# Patient Record
Sex: Female | Born: 1985
Health system: Southern US, Community
[De-identification: ages and names within clinical notes are randomized; demographics above are authoritative.]

## PROBLEM LIST (undated history)

## (undated) DIAGNOSIS — R87629 Unspecified abnormal cytological findings in specimens from vagina: Secondary | ICD-10-CM

## (undated) DIAGNOSIS — K2970 Gastritis, unspecified, without bleeding: Secondary | ICD-10-CM

## (undated) DIAGNOSIS — F319 Bipolar disorder, unspecified: Secondary | ICD-10-CM

## (undated) DIAGNOSIS — E079 Disorder of thyroid, unspecified: Secondary | ICD-10-CM

## (undated) DIAGNOSIS — K589 Irritable bowel syndrome without diarrhea: Secondary | ICD-10-CM

## (undated) DIAGNOSIS — N390 Urinary tract infection, site not specified: Secondary | ICD-10-CM

## (undated) DIAGNOSIS — K219 Gastro-esophageal reflux disease without esophagitis: Secondary | ICD-10-CM

## (undated) DIAGNOSIS — N159 Renal tubulo-interstitial disease, unspecified: Secondary | ICD-10-CM

## (undated) DIAGNOSIS — Z86018 Personal history of other benign neoplasm: Secondary | ICD-10-CM

## (undated) HISTORY — PX: TUBAL LIGATION: SHX77

## (undated) HISTORY — DX: Unspecified abnormal cytological findings in specimens from vagina: R87.629

## (undated) HISTORY — DX: Disorder of thyroid, unspecified: E07.9

## (undated) HISTORY — DX: Personal history of other benign neoplasm: Z86.018

## (undated) HISTORY — PX: OTHER SURGICAL HISTORY: SHX169

## (undated) HISTORY — PX: LASER ABLATION OF THE CERVIX: SHX1949

## (undated) HISTORY — DX: Gastro-esophageal reflux disease without esophagitis: K21.9

## (undated) HISTORY — DX: Renal tubulo-interstitial disease, unspecified: N15.9

## (undated) HISTORY — DX: Urinary tract infection, site not specified: N39.0

## (undated) HISTORY — DX: Gastritis, unspecified, without bleeding: K29.70

## (undated) HISTORY — DX: Irritable bowel syndrome, unspecified: K58.9

## (undated) HISTORY — PX: DILATION AND CURETTAGE OF UTERUS: SHX78

## (undated) HISTORY — PX: INDUCED ABORTION: SHX677

---
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
## 2003-01-18 ENCOUNTER — Ambulatory Visit (HOSPITAL_COMMUNITY): Admission: AD | Admit: 2003-01-18 | Discharge: 2003-01-18 | Payer: Self-pay | Admitting: Obstetrics and Gynecology

## 2003-02-06 ENCOUNTER — Ambulatory Visit (HOSPITAL_COMMUNITY): Admission: AD | Admit: 2003-02-06 | Discharge: 2003-02-06 | Payer: Self-pay | Admitting: Obstetrics and Gynecology

## 2003-02-12 ENCOUNTER — Ambulatory Visit (HOSPITAL_COMMUNITY): Admission: RE | Admit: 2003-02-12 | Discharge: 2003-02-12 | Payer: Self-pay | Admitting: Obstetrics and Gynecology

## 2003-02-17 ENCOUNTER — Ambulatory Visit (HOSPITAL_COMMUNITY): Admission: RE | Admit: 2003-02-17 | Discharge: 2003-02-17 | Payer: Self-pay | Admitting: Obstetrics and Gynecology

## 2003-02-23 ENCOUNTER — Ambulatory Visit (HOSPITAL_COMMUNITY): Admission: RE | Admit: 2003-02-23 | Discharge: 2003-02-23 | Payer: Self-pay | Admitting: Obstetrics and Gynecology

## 2003-03-04 ENCOUNTER — Ambulatory Visit (HOSPITAL_COMMUNITY): Admission: AD | Admit: 2003-03-04 | Discharge: 2003-03-05 | Payer: Self-pay | Admitting: Obstetrics and Gynecology

## 2003-03-06 ENCOUNTER — Ambulatory Visit (HOSPITAL_COMMUNITY): Admission: AD | Admit: 2003-03-06 | Discharge: 2003-03-06 | Payer: Self-pay | Admitting: Obstetrics and Gynecology

## 2003-03-12 ENCOUNTER — Ambulatory Visit (HOSPITAL_COMMUNITY): Admission: RE | Admit: 2003-03-12 | Discharge: 2003-03-12 | Payer: Self-pay | Admitting: Obstetrics and Gynecology

## 2003-03-17 ENCOUNTER — Inpatient Hospital Stay (HOSPITAL_COMMUNITY): Admission: AD | Admit: 2003-03-17 | Discharge: 2003-03-21 | Payer: Self-pay | Admitting: Obstetrics and Gynecology

## 2004-01-11 ENCOUNTER — Emergency Department (HOSPITAL_COMMUNITY): Admission: EM | Admit: 2004-01-11 | Discharge: 2004-01-11 | Payer: Self-pay | Admitting: Emergency Medicine

## 2004-02-11 ENCOUNTER — Emergency Department (HOSPITAL_COMMUNITY): Admission: EM | Admit: 2004-02-11 | Discharge: 2004-02-12 | Payer: Self-pay | Admitting: Emergency Medicine

## 2004-02-21 ENCOUNTER — Emergency Department (HOSPITAL_COMMUNITY): Admission: EM | Admit: 2004-02-21 | Discharge: 2004-02-21 | Payer: Self-pay | Admitting: Emergency Medicine

## 2004-06-01 ENCOUNTER — Emergency Department (HOSPITAL_COMMUNITY): Admission: EM | Admit: 2004-06-01 | Discharge: 2004-06-01 | Payer: Self-pay | Admitting: Emergency Medicine

## 2004-06-17 ENCOUNTER — Inpatient Hospital Stay (HOSPITAL_COMMUNITY): Admission: EM | Admit: 2004-06-17 | Discharge: 2004-06-18 | Payer: Self-pay | Admitting: *Deleted

## 2004-07-08 ENCOUNTER — Ambulatory Visit (HOSPITAL_COMMUNITY): Admission: AD | Admit: 2004-07-08 | Discharge: 2004-07-08 | Payer: Self-pay | Admitting: Obstetrics and Gynecology

## 2004-07-30 ENCOUNTER — Ambulatory Visit (HOSPITAL_COMMUNITY): Admission: RE | Admit: 2004-07-30 | Discharge: 2004-07-30 | Payer: Self-pay | Admitting: Obstetrics and Gynecology

## 2004-08-29 ENCOUNTER — Ambulatory Visit (HOSPITAL_COMMUNITY): Admission: AD | Admit: 2004-08-29 | Discharge: 2004-08-29 | Payer: Self-pay | Admitting: Obstetrics and Gynecology

## 2004-09-08 ENCOUNTER — Ambulatory Visit (HOSPITAL_COMMUNITY): Admission: RE | Admit: 2004-09-08 | Discharge: 2004-09-08 | Payer: Self-pay | Admitting: Obstetrics and Gynecology

## 2004-09-29 ENCOUNTER — Ambulatory Visit (HOSPITAL_COMMUNITY): Admission: AD | Admit: 2004-09-29 | Discharge: 2004-09-29 | Payer: Self-pay | Admitting: Obstetrics and Gynecology

## 2004-09-30 ENCOUNTER — Ambulatory Visit (HOSPITAL_COMMUNITY): Admission: AD | Admit: 2004-09-30 | Discharge: 2004-09-30 | Payer: Self-pay | Admitting: Obstetrics and Gynecology

## 2004-10-14 ENCOUNTER — Inpatient Hospital Stay (HOSPITAL_COMMUNITY): Admission: AD | Admit: 2004-10-14 | Discharge: 2004-10-15 | Payer: Self-pay | Admitting: Obstetrics and Gynecology

## 2004-10-20 ENCOUNTER — Observation Stay (HOSPITAL_COMMUNITY): Admission: AD | Admit: 2004-10-20 | Discharge: 2004-10-21 | Payer: Self-pay | Admitting: Obstetrics and Gynecology

## 2004-10-28 ENCOUNTER — Inpatient Hospital Stay (HOSPITAL_COMMUNITY): Admission: RE | Admit: 2004-10-28 | Discharge: 2004-10-31 | Payer: Self-pay | Admitting: Obstetrics and Gynecology

## 2004-10-28 ENCOUNTER — Encounter: Payer: Self-pay | Admitting: Obstetrics and Gynecology

## 2005-03-09 ENCOUNTER — Emergency Department (HOSPITAL_COMMUNITY): Admission: EM | Admit: 2005-03-09 | Discharge: 2005-03-09 | Payer: Self-pay | Admitting: Emergency Medicine

## 2005-07-24 ENCOUNTER — Emergency Department (HOSPITAL_COMMUNITY): Admission: EM | Admit: 2005-07-24 | Discharge: 2005-07-25 | Payer: Self-pay | Admitting: Emergency Medicine

## 2005-07-26 ENCOUNTER — Emergency Department (HOSPITAL_COMMUNITY): Admission: EM | Admit: 2005-07-26 | Discharge: 2005-07-26 | Payer: Self-pay | Admitting: Emergency Medicine

## 2005-09-19 ENCOUNTER — Emergency Department (HOSPITAL_COMMUNITY): Admission: EM | Admit: 2005-09-19 | Discharge: 2005-09-20 | Payer: Self-pay | Admitting: Emergency Medicine

## 2005-12-04 ENCOUNTER — Emergency Department (HOSPITAL_COMMUNITY): Admission: EM | Admit: 2005-12-04 | Discharge: 2005-12-04 | Payer: Self-pay | Admitting: Emergency Medicine

## 2006-01-28 ENCOUNTER — Emergency Department (HOSPITAL_COMMUNITY): Admission: EM | Admit: 2006-01-28 | Discharge: 2006-01-28 | Payer: Self-pay | Admitting: Emergency Medicine

## 2006-02-28 ENCOUNTER — Encounter (INDEPENDENT_AMBULATORY_CARE_PROVIDER_SITE_OTHER): Payer: Self-pay | Admitting: Specialist

## 2006-02-28 ENCOUNTER — Other Ambulatory Visit: Admission: RE | Admit: 2006-02-28 | Discharge: 2006-02-28 | Payer: Self-pay | Admitting: Unknown Physician Specialty

## 2006-09-20 ENCOUNTER — Emergency Department (HOSPITAL_COMMUNITY): Admission: EM | Admit: 2006-09-20 | Discharge: 2006-09-20 | Payer: Self-pay | Admitting: Emergency Medicine

## 2006-09-23 ENCOUNTER — Emergency Department (HOSPITAL_COMMUNITY): Admission: EM | Admit: 2006-09-23 | Discharge: 2006-09-23 | Payer: Self-pay | Admitting: Emergency Medicine

## 2006-09-26 ENCOUNTER — Emergency Department (HOSPITAL_COMMUNITY): Admission: EM | Admit: 2006-09-26 | Discharge: 2006-09-27 | Payer: Self-pay | Admitting: Emergency Medicine

## 2007-03-26 ENCOUNTER — Emergency Department (HOSPITAL_COMMUNITY): Admission: EM | Admit: 2007-03-26 | Discharge: 2007-03-26 | Payer: Self-pay | Admitting: Emergency Medicine

## 2008-02-22 ENCOUNTER — Emergency Department (HOSPITAL_COMMUNITY): Admission: EM | Admit: 2008-02-22 | Discharge: 2008-02-22 | Payer: Self-pay | Admitting: Emergency Medicine

## 2008-03-28 ENCOUNTER — Emergency Department (HOSPITAL_COMMUNITY): Admission: EM | Admit: 2008-03-28 | Discharge: 2008-03-28 | Payer: Self-pay | Admitting: Emergency Medicine

## 2008-09-19 ENCOUNTER — Emergency Department (HOSPITAL_COMMUNITY): Admission: EM | Admit: 2008-09-19 | Discharge: 2008-09-19 | Payer: Self-pay | Admitting: Emergency Medicine

## 2008-09-25 ENCOUNTER — Emergency Department (HOSPITAL_COMMUNITY): Admission: EM | Admit: 2008-09-25 | Discharge: 2008-09-25 | Payer: Self-pay | Admitting: Emergency Medicine

## 2008-12-07 ENCOUNTER — Emergency Department (HOSPITAL_COMMUNITY): Admission: EM | Admit: 2008-12-07 | Discharge: 2008-12-07 | Payer: Self-pay | Admitting: Emergency Medicine

## 2009-01-22 ENCOUNTER — Emergency Department (HOSPITAL_COMMUNITY): Admission: EM | Admit: 2009-01-22 | Discharge: 2009-01-22 | Payer: Self-pay | Admitting: Emergency Medicine

## 2009-07-25 ENCOUNTER — Emergency Department (HOSPITAL_COMMUNITY): Admission: EM | Admit: 2009-07-25 | Discharge: 2009-07-25 | Payer: Self-pay | Admitting: Emergency Medicine

## 2009-08-09 ENCOUNTER — Emergency Department: Payer: Self-pay | Admitting: Emergency Medicine

## 2010-03-02 ENCOUNTER — Other Ambulatory Visit (HOSPITAL_COMMUNITY)
Admission: RE | Admit: 2010-03-02 | Discharge: 2010-03-02 | Disposition: A | Discharge status: Home / Self Care | Payer: Managed Care, Other (non HMO) | Source: Ambulatory Visit | Attending: Obstetrics and Gynecology | Admitting: Obstetrics and Gynecology

## 2010-03-02 ENCOUNTER — Other Ambulatory Visit: Payer: Self-pay | Admitting: Obstetrics and Gynecology

## 2010-03-02 DIAGNOSIS — Z01419 Encounter for gynecological examination (general) (routine) without abnormal findings: Secondary | ICD-10-CM | POA: Insufficient documentation

## 2010-04-03 LAB — URINALYSIS, ROUTINE W REFLEX MICROSCOPIC
Bilirubin Urine: NEGATIVE
Glucose, UA: NEGATIVE mg/dL
Hgb urine dipstick: NEGATIVE
Ketones, ur: NEGATIVE mg/dL
Nitrite: NEGATIVE
Protein, ur: NEGATIVE mg/dL
Specific Gravity, Urine: <1.005 — ABNORMAL LOW (ref 1.005–1.030)
Urobilinogen, UA: 0.2 mg/dL (ref 0.0–1.0)
pH: 6.5 (ref 5.0–8.0)

## 2010-04-03 LAB — PREGNANCY, URINE: Preg Test, Ur: NEGATIVE

## 2010-04-21 LAB — URINALYSIS, ROUTINE W REFLEX MICROSCOPIC
Bilirubin Urine: NEGATIVE
Glucose, UA: NEGATIVE mg/dL
Hgb urine dipstick: NEGATIVE
Ketones, ur: NEGATIVE mg/dL
Nitrite: NEGATIVE
Protein, ur: NEGATIVE mg/dL
Specific Gravity, Urine: 1.025 (ref 1.005–1.030)
Urobilinogen, UA: 0.2 mg/dL (ref 0.0–1.0)
pH: 6.5 (ref 5.0–8.0)

## 2010-04-21 LAB — WET PREP, GENITAL
Trich, Wet Prep: NONE SEEN
Yeast Wet Prep HPF POC: NONE SEEN

## 2010-04-21 LAB — URINE CULTURE: Colony Count: 55000

## 2010-04-21 LAB — GC/CHLAMYDIA PROBE AMP, GENITAL
Chlamydia, DNA Probe: NEGATIVE
GC Probe Amp, Genital: NEGATIVE

## 2010-04-21 LAB — PREGNANCY, URINE: Preg Test, Ur: NEGATIVE

## 2010-04-23 LAB — URINE MICROSCOPIC-ADD ON

## 2010-04-23 LAB — URINALYSIS, ROUTINE W REFLEX MICROSCOPIC
Bilirubin Urine: NEGATIVE
Glucose, UA: NEGATIVE mg/dL
Glucose, UA: NEGATIVE mg/dL
Ketones, ur: NEGATIVE mg/dL
Ketones, ur: NEGATIVE mg/dL
Leukocytes, UA: NEGATIVE
Nitrite: NEGATIVE
Nitrite: NEGATIVE
Protein, ur: 100 mg/dL — AB
Protein, ur: NEGATIVE mg/dL
Specific Gravity, Urine: 1.004 — ABNORMAL LOW (ref 1.005–1.030)
Specific Gravity, Urine: >1.03 — ABNORMAL HIGH (ref 1.005–1.030)
Urobilinogen, UA: 0.2 mg/dL (ref 0.0–1.0)
Urobilinogen, UA: 2 mg/dL — ABNORMAL HIGH (ref 0.0–1.0)
pH: 6 (ref 5.0–8.0)
pH: 7 (ref 5.0–8.0)

## 2010-04-23 LAB — URINE CULTURE: Colony Count: >=100000

## 2010-04-23 LAB — POCT PREGNANCY, URINE: Preg Test, Ur: NEGATIVE

## 2010-04-23 LAB — PREGNANCY, URINE: Preg Test, Ur: NEGATIVE

## 2010-04-29 LAB — GC/CHLAMYDIA PROBE AMP, GENITAL
Chlamydia, DNA Probe: NEGATIVE
GC Probe Amp, Genital: NEGATIVE

## 2010-04-29 LAB — URINALYSIS, ROUTINE W REFLEX MICROSCOPIC
Bilirubin Urine: NEGATIVE
Nitrite: NEGATIVE
Protein, ur: NEGATIVE mg/dL

## 2010-04-29 LAB — URINE CULTURE

## 2010-04-29 LAB — WET PREP, GENITAL
Trich, Wet Prep: NONE SEEN
Yeast Wet Prep HPF POC: NONE SEEN

## 2010-05-06 ENCOUNTER — Other Ambulatory Visit: Payer: Self-pay | Admitting: Obstetrics & Gynecology

## 2010-05-06 ENCOUNTER — Encounter (HOSPITAL_COMMUNITY): Payer: Managed Care, Other (non HMO)

## 2010-05-06 ENCOUNTER — Other Ambulatory Visit: Payer: Self-pay | Admitting: Infectious Diseases

## 2010-05-06 LAB — URINALYSIS, ROUTINE W REFLEX MICROSCOPIC
Bilirubin Urine: NEGATIVE
Hgb urine dipstick: NEGATIVE
Ketones, ur: NEGATIVE mg/dL
Nitrite: NEGATIVE
pH: 6.5 (ref 5.0–8.0)

## 2010-05-06 LAB — COMPREHENSIVE METABOLIC PANEL
BUN: 6 mg/dL (ref 6–23)
CO2: 22 mEq/L (ref 19–32)
Calcium: 8.7 mg/dL (ref 8.4–10.5)
Chloride: 105 mEq/L (ref 96–112)
Creatinine, Ser: 0.86 mg/dL (ref 0.4–1.2)
GFR calc Af Amer: >60 mL/min (ref >60)
GFR calc non Af Amer: >60 mL/min (ref >60)
Glucose, Bld: 80 mg/dL (ref 70–99)
Total Bilirubin: 0.4 mg/dL (ref 0.3–1.2)

## 2010-05-06 LAB — CBC
HCT: 37.4 % (ref 36.0–46.0)
Hemoglobin: 12.9 g/dL (ref 12.0–15.0)
MCH: 31.2 pg (ref 26.0–34.0)
MCHC: 34.5 g/dL (ref 30.0–36.0)
MCV: 90.3 fL (ref 78.0–100.0)
RBC: 4.14 MIL/uL (ref 3.87–5.11)

## 2010-05-06 LAB — SURGICAL PCR SCREEN
MRSA, PCR: NEGATIVE
Staphylococcus aureus: NEGATIVE

## 2010-05-06 LAB — HCG, QUANTITATIVE, PREGNANCY: hCG, Beta Chain, Quant, S: <2 m[IU]/mL (ref <5)

## 2010-05-12 ENCOUNTER — Other Ambulatory Visit: Payer: Self-pay | Admitting: Obstetrics & Gynecology

## 2010-05-12 ENCOUNTER — Ambulatory Visit (HOSPITAL_COMMUNITY)
Admission: RE | Admit: 2010-05-12 | Discharge: 2010-05-12 | Disposition: A | Discharge status: Home / Self Care | Payer: Managed Care, Other (non HMO) | Source: Ambulatory Visit | Attending: Obstetrics & Gynecology | Admitting: Obstetrics & Gynecology

## 2010-05-12 DIAGNOSIS — N920 Excessive and frequent menstruation with regular cycle: Secondary | ICD-10-CM | POA: Insufficient documentation

## 2010-05-12 DIAGNOSIS — Z01812 Encounter for preprocedural laboratory examination: Secondary | ICD-10-CM | POA: Insufficient documentation

## 2010-05-12 DIAGNOSIS — N946 Dysmenorrhea, unspecified: Secondary | ICD-10-CM | POA: Insufficient documentation

## 2010-05-30 ENCOUNTER — Emergency Department (HOSPITAL_COMMUNITY)
Admission: EM | Admit: 2010-05-30 | Discharge: 2010-05-31 | Disposition: A | Discharge status: Home / Self Care | Payer: Managed Care, Other (non HMO) | Attending: Emergency Medicine | Admitting: Emergency Medicine

## 2010-05-30 ENCOUNTER — Emergency Department (HOSPITAL_COMMUNITY): Payer: Managed Care, Other (non HMO)

## 2010-05-30 DIAGNOSIS — F17200 Nicotine dependence, unspecified, uncomplicated: Secondary | ICD-10-CM | POA: Insufficient documentation

## 2010-05-30 DIAGNOSIS — M25529 Pain in unspecified elbow: Secondary | ICD-10-CM | POA: Insufficient documentation

## 2010-05-31 ENCOUNTER — Encounter: Payer: Self-pay | Admitting: Orthopedic Surgery

## 2010-05-31 ENCOUNTER — Ambulatory Visit (INDEPENDENT_AMBULATORY_CARE_PROVIDER_SITE_OTHER): Payer: Managed Care, Other (non HMO) | Admitting: Orthopedic Surgery

## 2010-05-31 VITALS — Ht 64.0 in | Wt 188.0 lb

## 2010-05-31 DIAGNOSIS — S52123A Displaced fracture of head of unspecified radius, initial encounter for closed fracture: Secondary | ICD-10-CM

## 2010-05-31 DIAGNOSIS — S52121A Displaced fracture of head of right radius, initial encounter for closed fracture: Secondary | ICD-10-CM

## 2010-05-31 MED ORDER — IBUPROFEN 800 MG PO TABS: 800.0000 mg | ORAL_TABLET | Freq: Three times a day (TID) | ORAL | Status: AC | PRN

## 2010-05-31 MED ORDER — HYDROCODONE-ACETAMINOPHEN 7.5-325 MG PO TABS
1.0000 | ORAL_TABLET | Freq: Four times a day (QID) | ORAL | Status: AC | PRN
Start: 2010-05-31 — End: 2010-06-14

## 2010-05-31 NOTE — Progress Notes (Signed)
Is aInjury May 13 mechanism fell down the stairs landed on her RIGHT elbow  Went to the emergency room x-rays unclear on whether a fracture is present but there is a large elbow joint effusion  She complains of sharp throbbing stabbing pain, she rates that a 10.  She indicates the pain is constant worse at night and worse with trying to move the elbow joint.  She says that she has some bruising which is confirmed on physical exam there is some numbness and tingling which is intermittent in all of the fingers.  She reports locking and catching but mainly this is loss of motion.  Swelling is also present.  Initial treatment was with a sling and Percocet 5 mg for pain    Family History  Problem Relation Age of Onset  . Heart disease    . Arthritis    . Cancer    . Asthma    . Diabetes     Past Medical History  Diagnosis Date  . Gastritis   . Acid reflux   . Frequent UTI   . Infections of kidney    Past Surgical History  Procedure Date  . Dilation and curettage of uterus   . Induced abortion   . Laser ablation of the cervix   . C sections    Her review of systems was positive for all systems except for cardiovascular and neurology than stated she reports fatigue and blurred vision eyes ordering shortness of breath wheezing cough tightness in the chest, heartburn, nausea vomiting, constipation diarrhea, painful urination, skin redness, nervousness anxiety easy bruising, excessive thirst cold intolerance and seasonal ALLERGY   General: The patient is normally developed, with normal grooming and hygiene. There are no gross deformities. The body habitus is normal   CDV: The pulse and perfusion of the extremities are normal   LYMPH: There is no gross lymphadenopathy in the extremities   Skin: There are no rashes, ulcers or cafe-au-lait spot   Psyche: The patient is alert, awake and oriented.  Mood is normal   Neuro:  The coordination and balance are normal.  Sensation is  normal. Reflexes are 2+ and equal   Musculoskeletal  RIGHT elbow range of motion flexion 110, extension 80.  Tenderness over the radial head pain with supination and pronation of the elbow.  Medial and lateral epicondyles nontender olecranon nontender.  Definite elbow swelling.  Hand flexion extension normal wrist flexion extension normal all joints stable shoulder nontender humerus nontender  X-ray there appears to be a hairline fracture in the radial head there is a large soft tissue swelling noted on x-ray  Impression radial head fracture  Recommend sling and early range of motion pain medication and ibuprofen  Return for reevaluation.

## 2010-05-31 NOTE — Patient Instructions (Addendum)
Sling x 7 days.  3 times during the day take sling off and move elbow 25 times    Apply 3 times a day for 20 minutes each time  Take pain medication and anti-inflamatory for pain   Note: no typing

## 2010-06-02 ENCOUNTER — Telehealth: Payer: Self-pay | Admitting: Orthopedic Surgery

## 2010-06-02 ENCOUNTER — Encounter: Payer: Self-pay | Admitting: Orthopedic Surgery

## 2010-06-02 NOTE — Telephone Encounter (Signed)
Patient states that employer, [employer] office, has no light duty work available; therefore, patient will need to remain out of work.  Updated note being provided as per request.  Patient will bring in appropriate forms for work status.

## 2010-06-04 NOTE — Op Note (Signed)
NAME:  Jackie Smith, Jackie Smith                       ACCOUNT NO.:  [id number]   MEDICAL RECORD NO.:  [id number]                   PATIENT TYPE:  INP   LOCATION:  LDR1                                 FACILITY:  [facility]   PHYSICIAN:  Lazaro Arms, M.D.                DATE OF BIRTH:  Jun 14, 1985   DATE OF PROCEDURE:  03/18/2003  DATE OF DISCHARGE:                                 OPERATIVE REPORT   PROCEDURE:  Epidural placement.   INDICATIONS FOR PROCEDURE:  [name] is a 25 year old gravida 2, para 1 being  induced.  The cervix is 4 cm, and she has had IV pain medicine and is  requesting an epidural.   DESCRIPTION OF PROCEDURE:  The patient was placed in the sitting position.  The L2-L3 interspace was identified.  Her back was prepped and draped in the  usual sterile fashion.  Lidocaine 1% was injected as a local anesthetic.  A  17-gauge Tuohy needle was used with one pass with loss of resistance  technique employed.  The epidural space was found without difficulty.  The  epidural catheter was fed into the space, and 10 cc of 0.125% bupivacaine  plain was given as a test dose with an additional 10 cc to dose it up.  It  was taped down 4 cm into the epidural space at the skin and taped sterilely.   The patient tolerated the procedure well.  She experienced no complications.  Fetal heart rate was stable, and she was gaining relief.      ___________________________________________                                            Lazaro Arms, M.D.   LHE/MEDQ  D:  03/18/2003  T:  03/18/2003  Job:  [id number]

## 2010-06-04 NOTE — Group Therapy Note (Signed)
[name], Jackie Smith             ACCOUNT NO.:  [id number]   MEDICAL RECORD NO.:  [id number]          PATIENT TYPE:  OIB   LOCATION:  LDR3                          FACILITY:  [facility]   PHYSICIAN:  Tilda Burrow, M.D. DATE OF BIRTH:  [birth year]   DATE OF PROCEDURE:  DATE OF DISCHARGE:  09/30/2004                                   PROGRESS NOTE   [name] came to [facility] on September 30, 2004 about 5:30 p.m.  with complaints of back pain and pressure. She had been seen in labor and  delivery about 24 hours prior with the same complaints. See dictation from  Darlene for prior observation note. This was essentially about the same. She  has no contractions. Cervix long, thick, closed. Urine dip negative. Fetal  heart rate is fine. It just appears to be pain of pregnancy associated with  musculoskeletal discomfort. I talked to [place] - she does work about 50  hours a week and goes to school - about possibly decreasing some of her  activities. She says she does sit down at work, but it is still long hours.  I recommended a maternity belt for added support. She went home in  satisfactory condition.      Jacklyn Shell, C.N.M.      Tilda Burrow, M.D.  Electronically Signed    FC/MEDQ  D:  10/01/2004  T:  10/01/2004  Job:  [id number]   cc:   [facility]

## 2010-06-04 NOTE — H&P (Signed)
Jackie Smith, Jackie Smith             ACCOUNT NO.:  [id number]   MEDICAL RECORD NO.:  [id number]          PATIENT TYPE:  INP   LOCATION:  A415                          FACILITY:  [facility]   PHYSICIAN:  Tilda Burrow, M.D. DATE OF BIRTH:  1985/06/14   DATE OF ADMISSION:  10/28/2004  DATE OF DISCHARGE:  LH                                HISTORY & PHYSICAL   ADMISSION DIAGNOSIS:  History of 36-1/[redacted] weeks gestation, repeat cesarean  section with recurrent preterm labor, not responding to analgesics.   HISTORY OF PRESENT ILLNESS:  This 25 year old female, gravida 4, para 2,  abortus 1, prior vaginal delivery 2003, prior cesarean section 2005 for  suspected macrosomic infant, is admitted at this time at 37-1/[redacted] weeks  gestation after presenting once again to Labor and Delivery with preterm  uterine contractions of a regular frequency.  This time, she is finally  changing her cervix.  She was 3-4 cm by nursing exam.  Cervix is extremely  posterior.  Lower uterine segments is slight dilated from all the prolonged  episodes of contraction.  Fetal heart rate tracing is reactive.  She has  required narcotics several times through the night in spite the cervix is  changing by the same examiner's evaluation.  Will proceed with cesarean  section.  Additionally, the patient desires permanent sterilization by tubal  ligation.  We will widely excise the old scar, as discussed earlier in the  office, to reduce moisture trapping in the old skin crease.   PAST MEDICAL HISTORY:  Thyroid problems in the past, currently euthyroid.   PAST SURGICAL HISTORY:  1.  D&C.  2.  Cesarean section.   SOCIAL HISTORY:  Unstable, problem with biologic father.   PHYSICAL EXAMINATION:  GENERAL APPEARANCE:  Large framed, Caucasian female,  alert and oriented x3.  VITAL SIGNS:  Weight 250 which is a 20 pound weight gain.  Fundal height 38  cm.  Estimated fetal weight 6-1/2 pounds.  Cervix 3-4 cm extremely  posteriorly  oriented beneath a very dilated lower uterine segment.   LABORATORY DATA:  Prenatal labs include blood type AB positive.  Urine drug  screen negative.  Rubella immune equivocal at 8.8.  Hemoglobin 12,  hematocrit 37.  Hepatitis/HIV/RPR/GC/chlamydia all negative.   PLAN:  Admit.  Proceed with repeat cesarean section. Tubal ligation  discussed.  Patient planning to proceed with tubal at this time.      Tilda Burrow, M.D.  Electronically Signed     JVF/MEDQ  D:  10/28/2004  T:  10/28/2004  Job:  [id number]   cc:   Francoise Schaumann. Halford Chessman  Fax: [phone]

## 2010-06-04 NOTE — Discharge Summary (Signed)
NAMECALAIS, SVEHLA             ACCOUNT NO.:  [id number]   MEDICAL RECORD NO.:  [id number]          PATIENT TYPE:  INP   LOCATION:  A418                          FACILITY:  [facility]   PHYSICIAN:  Tilda Burrow, M.D. DATE OF BIRTH:  05/25/1985   DATE OF ADMISSION:  06/16/2004  DATE OF DISCHARGE:  06/02/2006LH                                 DISCHARGE SUMMARY   CHIEF COMPLAINT:  Right-sided abdominal pain, uncertain etiology.   DISCHARGE DIAGNOSES:  1.  Right-sided abdominal pain, resolved.  2.  Pregnancy, not delivered   HISTORY OF PRESENT ILLNESS:  This 25 year old female at 45 weeks' gestation  was seen in the emergency room with pain complaints in the right upper  quadrant not accompanied by nausea, vomiting or diarrhea.  She had been seen  in our office this week and evaluation there had been normal for evaluation  of possible gallbladder pain.  She had been seen the week by Jacklyn Shell, C.N.M. in our office and had been told that there was no  evidence of gallbladder disease.  She was admitted with blood pressure  111/63, rate pulse 75 with emergency room evaluation including IV fluids and  analgesics.  She was admitted for pain management.   LABORATORY DATA AND X-RAY FINDINGS:  White count 7700.   Abdominal ultrasound was negative for gallbladder, inflammation and no  evidence of kidney stones.   ASSESSMENT:  She was admitted for analgesic management.   HOSPITAL COURSE:  She was observed over June 17, 2004, and received Buprenex  for pain level several times.  She complained of the pain being an 8/10  initially that day.  Interestingly, on June 18, 2004, she was evaluated by  Dr. Despina Hidden who thought that this might be a musculoskeletal trigger point and  was injected at 8:30 a.m. on June 18, 2004, with traumatic resolution of her  pain.  Interpretation was musculoskeletal trigger point and she was  therefore discharged home for outpatient care on the evening of  June 18, 2004.       JVF/MEDQ  D:  08/06/2004  T:  08/06/2004  Job:  [id number]

## 2010-06-04 NOTE — H&P (Signed)
NAMEALAINA, DONATI                         ACCOUNT NO.:  [id number]   MEDICAL RECORD NO.:  [id number]                  PATIENT TYPE:   LOCATION:                                       FACILITY:   PHYSICIAN:  Tilda Burrow, M.D.              DATE OF BIRTH:   DATE OF ADMISSION:  DATE OF DISCHARGE:                                HISTORY & PHYSICAL   ADMISSION DIAGNOSES:  1. Pregnancy, [redacted] weeks gestation.  2. History of large for gestational age infant.  3. Elective induction of labor.   HISTORY OF PRESENT ILLNESS:  This 25 year old female, gravida 3, para 1, AB  1, one living child, is admitted at this time after a pregnancy followed at  our office since August 27, 2002, with LMP of May 01, 2002, suggesting an  [facility] of February 05, 2003, with corrected [facility] of March 26, 2003, based on a 10-  week ultrasound at first visit.  A 19-week ultrasound confirmed that within  four days.  She is approximately 39 weeks by consensus criteria.  The  patient is admitted induction of labor.  Prenatal course was notable  initially for a suspected previa during the first trimester which was  treated with pelvic rest and followed up on subsequent ultrasound which  showed a very elongate cervix which at [redacted] weeks gestation still was measured  at 7.8 cm in length with no evidence of previa.  At this point, the patient  has a cervix which is very soft, but is quite high and long.  She is  admitted for Cytotec ripening.  The cervical exam suggested the cervix at 2  cm, 50%, and -2 on March 07, 2003, at which time the estimated fetal  weight was 7 pounds 4 ounces.   OBSTETRICAL HISTORY:  Significant in that she had a very prolonged induction  in March of 2002 for a 17-week fetal demise in utero which required  approximately 30 hours of effort and induction.  Subsequently required D&C.  In 2003, she had a term delivery at 78 weeks of a female infant that weighed 7  pounds 7-1/2 ounces, delivered under  epidural anesthesia.  At that labor,  she was admitted a 9 p.m. at 7 cm dilated and delivered at approximately 4  a.m. with the patient has historian.   PAST MEDICAL HISTORY:  Positive for hypothyroidism.   PAST SURGICAL HISTORY:  D&C for first pregnancy loss.   HABITS:  Cigarettes, alcohol, and recreational drugs denied.   MEDICATIONS:  Armour thyroid.   SOCIAL HISTORY:  Single.  Lives with the baby's father.  Works at [employer].   PHYSICAL EXAMINATION:  GENERAL APPEARANCE:  A healthy, overweight, large  framed, Caucasian female.  HEENT:  Pupils equal, round, and reactive.  CARDIOVASCULAR:  Unremarkable.  ABDOMEN:  Obese with 39-40 cm.  PELVIC:  Cervix 2 cm, 50%, and -2 by last office visit with presenting part  -  2 to -3 on today's visit.  Efforts at balloon insertion are unsuccessful  due to the high and very posterior oriented cervix.  Vertex presentation is  confirmed.   PLAN:  Overnight Cytotec cervical ripening.  Anticipate slow labor process.  May require epidural for analgesia.   ADDENDUM:  The patient's thyroid condition was managed by Dr. Patrecia Pace  during the pregnancy.     ___________________________________________                                         Tilda Burrow, M.D.   JVF/MEDQ  D:  03/17/2003  T:  03/17/2003  Job:  [id number]   cc:   Alan Mulder, M.D.  [address]  [place]  [place] [address]  Fax: [phone]   [facility] Department   [place]  [address]  [place]  [place] [address]  Fax: [phone]

## 2010-06-04 NOTE — Op Note (Signed)
Jackie Smith, Jackie Smith          ACCOUNT NO.:  [id number]   MEDICAL RECORD NO.:  [id number]          PATIENT TYPE:  INP   LOCATION:  A401                          FACILITY:  [facility]   PHYSICIAN:  Tilda Burrow, M.D. DATE OF BIRTH:  16-Mar-1985   DATE OF PROCEDURE:  10/28/2004  DATE OF DISCHARGE:                                 OPERATIVE REPORT   PREOPERATIVE DIAGNOSIS:  Pregnancy 36-1/[redacted] weeks gestation, repeat cesarean  section after trial of labor, early labor.  Desire for elective permanent  sterilization.  Obesity with panniculus.   POSTOPERATIVE DIAGNOSIS:  Pregnancy 36-1/[redacted] weeks gestation, repeat cesarean  section after trial of labor, early labor.  Desire for elective permanent  sterilization.  Obesity with panniculus.   OPERATION PERFORMED:  Repeat low transverse cervical cesarean section,  bilateral salpingo-oophorectomy, wide excision of cicatrix.   SURGEON:  Tilda Burrow, M.D.   ASSISTANTDolphus Jenny, RN   ANESTHESIA:  Spinal, Dillard Cannon, CRNA.   COMPLICATIONS:  None.   FINDINGS:  Bladder flap high on anterior abdominal wall.  Healthy female  infant, Apgars 9 and 9.  Weight __________ .   INDICATIONS FOR PROCEDURE:  A 25 year old female gravida 3, para 2 for  repeat cesarean section and tubal ligation.  She also has a panniculus  crease which she which she wishes addressed.  By taking out the old scar we  will be able to satisfactorily improve abdominal wall and eliminate the area  of chronic moisture.   DESCRIPTION OF PROCEDURE:  The patient was taken to the operating room and  prepped and draped.  Spinal anesthesia introduced effectively using Marcaine  spinal.  The area to be excised with the knife, scored with a knife  superficially and then the upper incision opened to allow access to the  fascia which was opened in the standard method of Pfannenstiel.  The  peritoneal cavity was entered easily.  The bladder flap was quite high, so  we worked around it  with a transverse incision on the peritoneum allowing  the bladder to be retracted and bladder flap developed on the lower uterine  segment.  Transverse uterine incision was performed.  Vacuum extractor was  utilized to guide the baby out without difficulty.  It was a healthy female  infant, Apgars 9 and 9.  See Dr. Camille Bal notes for further details.   The cord blood samples were obtained and the placenta delivered intact,  Tomasa Blase presentation.  There was a marginal insertion of the cord into a  large normal placenta with a three vessel cord confirmed.  Uterus was closed  with running locking 0 chromic.  There were a couple of large varicose veins  on the right side that required separate effort.  The patient then had  bladder flap reapproximation.   Tubal ligation:  Tubal ligation was performed by grasping each tube at its  midportion, elevating it and doubly ligating that midsegment portion of tube  with excision of the incarcerated knuckle of tube for histology.   Anterior peritoneum was irrigated.  Uterus had been irrigated before the  bladder flap was reperitonealized.  The  rectus muscles were pulled in the  midline after the peritoneum had been closed.  The fascia was  reapproximated.  Prior to closing the fascia, we excised the old panniculus  crease.  Approximately 6 cm wide x 35 cm long ellipse of skin and underlying  connective tissue.  The subcutaneous tissues were reapproximated after the  fascia was closed.  Staple closure of the skin completed the procedure.  The  patient went to the recovery room in good condition.  Sponge and needle  counts were correct.      Tilda Burrow, M.D.  Electronically Signed     JVF/MEDQ  D:  10/28/2004  T:  10/28/2004  Job:  [id number]

## 2010-06-04 NOTE — H&P (Signed)
Jackie Smith, Jackie Smith             ACCOUNT NO.:  [id number]   MEDICAL RECORD NO.:  [id number]          PATIENT TYPE:  OIB   LOCATION:  A415                          FACILITY:  [facility]   PHYSICIAN:  Tilda Burrow, M.D. DATE OF BIRTH:  05-03-1985   DATE OF ADMISSION:  10/13/2004  DATE OF DISCHARGE:  LH                                HISTORY & PHYSICAL   TIME OF ADMISSION:  2106   HISTORY OF PRESENT ILLNESS:  Jackie Smith is a 25 year old gravida 4, para 2,  previous cesarean section, who was admitted with complaints of uterine  contractions, lower abdominal pain, and cramping.   PAST MEDICAL HISTORY:  Thyroid problem.   PAST SURGICAL HISTORY:  D&C and cesarean section.   ALLERGIES:  No known allergies.   FAMILY HISTORY:  Benign.   PRENATAL COURSE:  Essentially uneventful.   LABORATORY DATA:  Blood type is AB-positive, rubella equivocal, hepatitis B  surface antigen negative, HIV negative, serologies negative, Pap class I,  AFP normal, HSV negative.  Hemoglobin 10.9 at 28 weeks, hematocrit 31.1, 1-  hour glucose 111.   PHYSICAL EXAMINATION:  VITAL SIGNS:  Stable.  PELVIC:  She is having some irregular contractions.  Cervix is now 1 cm  thick, presenting part is high.   PLAN:  We are going to admit the patient for pain management and tocolytics  and close observation.  Dr. Emelda Fear assisted in developing plan of care and  checked the patient and is in agreement with plan of care.      Zerita Boers, Lanier Clam      Tilda Burrow, M.D.  Electronically Signed    DL/MEDQ  D:  47/82/9562  T:  10/14/2004  Job:  [id number]   cc:   [facility]

## 2010-06-04 NOTE — Discharge Summary (Signed)
Jackie Smith, Jackie Smith             ACCOUNT NO.:  [id number]   MEDICAL RECORD NO.:  [id number]          PATIENT TYPE:  OIB   LOCATION:  A414                          FACILITY:  [facility]   PHYSICIAN:  Tilda Burrow, M.D. DATE OF BIRTH:  1985-01-26   DATE OF ADMISSION:  10/20/2004  DATE OF DISCHARGE:  10/05/2006LH                                 DISCHARGE SUMMARY   OBSERVATION SUMMARY:  This 25 year old gravida 4, para 2, AB 1 at 35+ weeks  gestation was admitted to rule out labor. She has had a history of  discomfort and pelvic pressure and occasionally irregular contractions. She  had a prior Cesarean section, not for trial of labor. She has a history of  vaginal delivery with a 7-pound 7-ounce infant with subsequent arrest of  labor and completely dilated with an 8-pound 13-ounce infant. She has been  having lots of pelvic pressure symptoms for the last few weeks. Upon  monitoring overnight, she has had irregular contractions, never more than  every 8 minutes, very mild _in__ nature. Cervical exam by me this a.m. shows  the cervix to be very posterior. I can barely reach the cervix. External os  may be 1 to 2 cm. Internal os is completely closed. Cervix is extremely  posterior and difficult to reach. This was unchanged from my last exam of  her greater than a week ago. The lower uterine segment is nontender. I do  not suspect any uterine incision problems at this time. She has only had one  contraction in a couple of hours at this time. The patient has multiple  questions over the management plan. We talked at length. Current plan is for  her to continue to leave her children with the husband. She has support with  her mother living nearby. She has phone and transportation access through  family members. She is to follow next Monday, October 25, 2004, for recheck  of cervix. She has Lorcet Plus, Ambien, and terbutaline at home in addition  to her Synthroid that she is taking already.  Cervical exam specifically  shows no bleeding per vagina. Fetal monitoring shows reactive pattern with  no uterine irritability. She had no contractions during the entire 15  minutes we were talking over treatment options. Follow up October 25, 2004.  Plans are to proceed with scheduling of Cesarean section at this time. Will  shoot for 38 weeks.      Tilda Burrow, M.D.  Electronically Signed     JVF/MEDQ  D:  10/21/2004  T:  10/21/2004  Job:  [id number]

## 2010-06-04 NOTE — Discharge Summary (Signed)
[facility]  Patient:    Jackie Smith, Jackie Smith Visit Number: [id number] MRN: [id number]          Service Type: OBS Location: 4A A418 01 Attending Physician:  Tilda Burrow Dictated by:   Zerita Boers, C.N.M. Admit Date:  11/23/2000 Discharge Date: 11/24/2000   CC:         [facility]   Discharge Summary  ADMITTING DIAGNOSIS: Pregnancy at 22 weeks, with complaint of a fall at work and abdominal cramping, with pain on her left side and lower back pain.  She denies any bleeding.  PHYSICAL EXAMINATION:  ABDOMEN: Fetal heart rate is in the 120s-140s.  There are no contractions on fetal monitor.  Abdomen soft, gravid, and nontender.  PELVIC: On vaginal examination the patient is thick and paused, posterior and firm.  Clean-catch was sent to the laboratory.  There is suprapubic tenderness upon palpation.  VITAL SIGNS: Stable.  TEMP 98.5 degrees, pulse 94, respirations 22, blood pressure 120/63.  PLAN: We are going to admit for observation for 24 hours and evaluate clean-catch urine.  (Note - Upon review of clean-catch UTI is noted.  The patients urine was sent for culture.  The patient was placed on IV tablets for a 24 hour course.  She was placed on Ancef 1 g q.4h.  CBC was obtained, which was within normal limits).  HOSPITAL COURSE: The hospital course was uneventful.  CBC was within normal limits.  Urine showed many bacteria, positive leukocyte esterase, trace hemoglobin, and rbc/hpf.  With completion of 24 hour regimen of antibiotics the patient was discharged.  DISCHARGE MEDICATIONS: P.o. Macrobid.  FOLLOW-UP: To be followed up in the office in one week. Dictated by:   Zerita Boers, C.N.M. Attending Physician:  Tilda Burrow DD:  12/05/00 TD:  12/05/00 Job: [id number]

## 2010-06-04 NOTE — H&P (Signed)
[name], [name]             ACCOUNT NO.:  [id number]   MEDICAL RECORD NO.:  [id number]          PATIENT TYPE:  OIB   LOCATION:  A415                          FACILITY:  [facility]   PHYSICIAN:  Tilda Burrow, M.D. DATE OF BIRTH:  08-10-1985   DATE OF ADMISSION:  09/29/2004  DATE OF DISCHARGE:  09/13/2006LH                                HISTORY & PHYSICAL   OBSERVATION NOTE:   REASON FOR ADMISSION:  Jackie Smith is a 25 year old patient who is at 32-[redacted]  weeks gestation complaining of pressure, states it started earlier in the  evening, no cramping just pressure.  She came in to be checked.  Fetal heart  rate pattern is stable.  There is no uterine contractions noted while she  was here.  Cervix is long, thick, closed and posterior, presenting part is  high.   PHYSICAL EXAMINATION:  Vital signs are stable.   ASSESSMENT:  After discussing with Alexandr the fact that she drove herself  here the patient is willing to try to get somebody to pick her up from the  hospital because she desires some medication to help her get some sleep.  She has had a bout of insomnia for the past couple of nights.   PLAN:  We are going to discharge her home if somebody can pick her up with  Nubain 15 and Phenergan 12.5 IM.  If not she will be discharged home on her  own without therapeutic rest.      Zerita Boers, Lanier Clam      Tilda Burrow, M.D.  Electronically Signed    DL/MEDQ  D:  04/54/0981  T:  09/29/2004  Job:  [id number]   cc:   [facility]

## 2010-06-04 NOTE — Discharge Summary (Signed)
[name], Jackie Smith                       ACCOUNT NO.:  [id number]   MEDICAL RECORD NO.:  [id number]                   PATIENT TYPE:  INP   LOCATION:  A410                                 FACILITY:  [facility]   PHYSICIAN:  Tilda Burrow, M.D.              DATE OF BIRTH:  1985/09/19   DATE OF ADMISSION:  03/17/2003  DATE OF DISCHARGE:  03/21/2003                                 DISCHARGE SUMMARY   ADMITTING DIAGNOSES:  1. Pregnancy at [redacted] weeks gestation.  2. History of large-for-gestational-age infant.  3. Elective induction of labor.   DISCHARGE DIAGNOSES:  1. Pregnancy at [redacted] weeks gestation, delivered.  2. History of large-for-gestational-age infant.  3. Failed induction with cephalopelvic disproportion.   HOSPITAL SUMMARY:  This healthy Caucasian female gravida 3 para 1 AB 1, one  living child, was admitted at [redacted] weeks gestation for Cytotec cervical  ripening.  The patient has been followed through our office.  She had a very  long cervix with no evidence of previa.  The patient had a previously  prolonged labor with a 17-week fetal demise in utero in March 2002 requiring  30 hours of induction.  In 2003 she had a term delivery at [redacted] weeks  gestation of a female infant, 7 pounds 7.5 ounces, under epidural anesthesia.  She had a slow transition phase, delivering at 4 a.m., 7 hours after  admission at 7 cm dilated.  Potential for pelvic arrest of dilation was  considered significant.   HOSPITAL COURSE:  The patient was admitted for induction of labor with a  comparatively larger infant than her previous pregnancy, admitted at 59  weeks on March 17, 2003.  Balloon cervical ripening was performed.  She  responded to the Cytotec overnight, was begun on Pitocin on the morning of  March 18, 2003, was 9 cm at 12 noon, pushed for 30 minutes, refused to push  any more at mid afternoon.  Epidural was increased.  The patient's fetal  vertex had descended but was significantly molded.   It did not rest against  the pelvic floor.  Suspicion was for an infant that weighed greater than 9  pounds.  It was felt that her inability to push was appropriate and so  cesarean section was recommended at mid afternoon at 2:15 p.m. and the  patient accepted this offer.  She then proceeded to go with cesarean  delivery, delivering an 8-pound 11.6-ounce infant with significantly molded  vertex, loose nuchal cord x1, and extensive varicosities of the lower  uterine segment.   Postoperative course was notable for increased blood loss.  Intraoperative  blood loss was estimated at 1200 mL.  Hemoglobin dropped from 10 and 31 to  8.2 and 20.  The patient was considered stable for discharge with no further  bleeding and tolerating a regular diet when discharged home March 21, 2003 in  stable condition with Al Pimple  drain still  in place.  The patient will be followed up in our office in 1  week, then 4 weeks.  The patient's postpartum care was otherwise uneventful.  She was discharged home on March 21, 2003 for routine postpartum care through  our office.    ___________________________________________                                         Tilda Burrow, M.D.   JVF/MEDQ  D:  04/07/2003  T:  04/08/2003  Job:  [id number]

## 2010-06-04 NOTE — H&P (Signed)
[facility]  Patient:    Jackie Smith, Jackie Smith Visit Number: [id number] MRN: [id number]          Service Type: OBS Location: 4A A418 01 Attending Physician:  Tilda Burrow Dictated by:   Langley Gauss, M.D. Admit Date:  11/23/2000 Discharge Date: 11/24/2000   CC:         Jackie Smith, M.D.   History and Physical  CHIEF COMPLAINT: Initially the patient was on observation status from September 23, 2000 to September 24, 2000.  The patient was admitted on September 24, 2000 due to failure to respond to indicated medical treatment.  Hospital care by Dr. Langley Gauss on September 25, 2000.  The patient was discharged under the care of Dr. Christin Smith on September 26, 2000.  Please note the discrepancy in the dates, with observation from September 23, 2000 to September 24, 2000.  Initially I had dictated that the patient was discharged home on September 24, 2000; however, upon re-evaluation of the patient she was admitted at that time with the hospitalization continued to September 26, 2000.  HISTORY OF PRESENT ILLNESS: On September 23, 2000 this 25 year old gravida 1 para 0 was seen in prenatal care at the office of Dr. Christin Smith.  She contacted nursing staff during the day of September 23, 2000 complaining of nausea and vomiting post eating.  She was, however, tolerating p.o. liquids. The patient was advised to restrict her activity and stick to a low-fat bland diet; however, very shortly thereafter the patient presented to the emergency room, at which time I was contacted to see her.  The emergency room physician did not see the patient.  I was contacted and assumed full management of the patient.  The patients chief complaint was that of nausea and vomiting x 4 days duration, vomiting x 1 on the day of admission.  The patient states that she felt okay and had a reasonable appetite but on each occasion after eating she experienced nausea and vomiting of  what solid food she had taken in.  She otherwise is tolerating a liquid diet.  She had no problems taking oral medications.  Pertinently, the patient has noted a four pound weight loss over the prior four days, comparing the weight to that from Dr. Benson Setting prenatal record.  The patient did experience significant reflux-type symptoms as a result of this continued nausea and vomiting.  On evaluation in the emergency room the patient was noted to have presence of ketonuria.  Thus, she was referred to the fourth floor on September 23, 2000, initially observation status; however, this was later converted to admission status.  PHYSICAL EXAMINATION:  GENERAL: Somewhat lethargic, pale-appearing white female.  VITAL SIGNS: Temperature 99.3 degrees, pulse 102, respiratory rate 20, blood pressure 102/64.  HEENT: Negative.  NECK: No adenopathy.  Neck supple.  Thyroid not palpable.  LUNGS: Clear.  CARDIOVASCULAR: Regular rate and rhythm.  ABDOMEN: Soft and nontender.  Gravid uterus identified, which is consistent with 16 weeks.  Fetal heart tones are likewise auscultated in the 150s.  There is no right upper quadrant tenderness, no adnexal pain, nor any adnexal masses.  EXTREMITIES: Noted to be normal.  LABORATORY DATA: Studies pertinent for presence of 2+ ketonuria.  ASSESSMENT: Sixteen week intrauterine pregnancy with significant nausea and vomiting, now x 4 days duration, resulting in ketonuria.  Thus, at this point in time, initially on September 23, 2000, the patient was placed on observation status.  PLAN: To receive intravenous fluid hydration  therapy as well as antiemetic therapy.  Thus, levels of care September 23, 2000 hospital observation services, [phone].  September 24, 2000 status changed to admission.  September 24, 2000 is thus being being the first day of hospital care.  September 25, 2000, subsequent inpatient services, [phone].  September 26, 2000, date of  discharge. The patient was cared for by Dr. Christin Smith, who assumed complete management of discharge on September 26, 2000.  FINAL DIAGNOSES:  1. Sixteen week intrauterine pregnancy.  2. Nausea and vomiting resulting in metabolic disturbance as exhibited by     weight loss and ketonuria.Dictated by:   Langley Gauss, M.D.  Attending Physician:  Tilda Burrow DD:  11/27/00 TD:  11/27/00 Job: [id number]

## 2010-06-04 NOTE — Discharge Summary (Signed)
Jackie Smith, Jackie Smith             ACCOUNT NO.:  [id number]   MEDICAL RECORD NO.:  [id number]          PATIENT TYPE:  INP   LOCATION:  A415                          FACILITY:  [facility]   PHYSICIAN:  Tilda Burrow, M.D. DATE OF BIRTH:  July 03, 1985   DATE OF ADMISSION:  10/14/2004  DATE OF DISCHARGE:  09/29/2006LH                                 DISCHARGE SUMMARY   ADMISSION DIAGNOSIS:  Pregnancy at 34-1/[redacted] weeks gestation, recurrent preterm  pelvic pressure, rule out preterm labor.   DISCHARGE DIAGNOSIS:  Recurrent pelvic pressure, false labor.   PROCEDURE:  Pain management and tocolytic therapy with terbutaline, oral.   DISCHARGE MEDICATIONS:  1.  Terbutaline 5 mg p.o. q.6h. p.r.n. contractions at home.  2.  Ambien one p.o. q.h.s. p.r.n. sleep.  3.  Lorcet Plus one to two by mouth q.6h. p.r.n. pain.   HISTORY OF PRESENT ILLNESS:  Jackie Smith is a 25 year old gravida 4, para 2, AB  O, with prior C-section x1, vaginal delivery x1, admitted with complaints of  uterine contractions, lower abdominal pain, and cramping.   PAST MEDICAL HISTORY:  See HPI.   HOSPITAL COURSE:  The patient has been having multiple visits with irregular  contractions.  She was 1 cm, thick, with high presenting part on admission.  The patient was admitted in the early morning hours of October 14, 2004.  The cervix was 1, thick, closed, and high, with irregular uterine  contractions noted.  We gave her tocolytics and kept her overnight.  The  following day, she was considered stable for discharge with unchanged  cervix, long, thick, and closed at discharge by Dr. Despina Hidden.  Medicines were  given as ordered, and followup will be as scheduled in the week.      Tilda Burrow, M.D.  Electronically Signed     JVF/MEDQ  D:  11/01/2004  T:  11/01/2004  Job:  [id number]

## 2010-06-04 NOTE — H&P (Signed)
[name], Smith             ACCOUNT NO.:  [id number]   MEDICAL RECORD NO.:  [id number]          PATIENT TYPE:  OIB   LOCATION:  A414                          FACILITY:  [facility]   PHYSICIAN:  Tilda Burrow, M.D. DATE OF BIRTH:  07-Apr-1985   DATE OF ADMISSION:  10/20/2004  DATE OF DISCHARGE:  LH                                HISTORY & PHYSICAL   REASON FOR ADMISSION:  Pregnancy at 36 weeks with regular uterine  contractions.   HISTORY OF PRESENT ILLNESS:  Jackie Smith is a 25 year old gravida 4, para 1,  previous C-section x1 admitted with complaints of regular uterine  contractions since the evening.   MEDICAL HISTORY:  Positive for thyroid problems.   SURGICAL HISTORY:  D&C and C-section.   ALLERGIES:  No known allergies.   PRENATAL COURSE:  Essentially uneventful.  Blood type is AB positive.  UDS  is negative.  Rubella is equivocal.  Hepatitis B surface antigen negative.  HIV is negative.  Serology is nonreactive.  GC and Chlamydia are negative.  MSAFP normal.  Sickle cell screen was negative.  A 28-week hemoglobin 10.9,  28-week hematocrit 34.1, 1-hour glucose 111.   PLAN:  Discussed plan of care with Dr. Despina Hidden.  We will continue to observe  the patient during the night for regular labor and therapeutic rest with  Nubain and Phenergan.  We plan to discharge her home in the morning unless  she continues labor and then we will consider repeat C-section.      Zerita Boers, Lanier Clam      Tilda Burrow, M.D.  Electronically Signed    DL/MEDQ  D:  96/29/5284  T:  10/20/2004  Job:  [id number]   cc:   [facility]

## 2010-06-04 NOTE — Discharge Summary (Signed)
NAMESERENITIE, Jackie Smith             ACCOUNT NO.:  [id number]   MEDICAL RECORD NO.:  [id number]          PATIENT TYPE:  INP   LOCATION:  A401                          FACILITY:  [facility]   PHYSICIAN:  Tilda Burrow, M.D. DATE OF BIRTH:  January 30, 1985   DATE OF ADMISSION:  10/28/2004  DATE OF DISCHARGE:  10/15/2006LH                                 DISCHARGE SUMMARY   ADMISSION DIAGNOSES:  1.  Pregnancy 36-1/[redacted] weeks gestation.  2.  Repeat cesarean section.  3.  Recurrent preterm labor, not responding to analgesics.   DISCHARGE DIAGNOSES:  1.  Pregnancy 36-1/2 weeks delivered.  2.  Desire for elective __permanent__ sterilization.   PROCEDURE:  Repeat low transverse cervical cesarean section, bilateral  partial salpingectomy.   DISCHARGE MEDICATIONS:  1.  Tylox 1-2 q.6 h p.r.n. pain.  2.  Motrin 800 mg one q.8 h p.r.n. mild pain.  3.  Synthroid 50 mcg p.o. daily.  4.  Prevacid 30 mg p.o. daily.  5.  Brethine discontinued.   HOSPITAL SUMMARY:  This 25 year old multiparous female with prior cesarean  section desiring repeat C-section and primary sterilization was admitted on  October 28, 2004 complaining of regular uterine contractions, this time  changing the cervix.  She has had extensive visits for prodromal symptoms  over the past few weeks.  Cervix had dilated to 3-4 cm, posteriorly  oriented.   HOSPITAL COURSE:  We performed a cesarean section and tubal ligation  performed the day of admission.  Findings at the time of C-section included  bladder flap located high on the _ anterior abdominal wall with healthy female  infant and Apgar 9 and 9.  An approximately 6 cm wide by 35 cm along the  loops of skin and underlying connective tissue were excised to result in  more improved wound infection, given her abdominal GERD.   Pathology report confirmed a 620 g placenta, a 20x2 cm cicatrix removed.  Postoperatively, the patient did well, tolerating a regular diet.  On the  first  postoperative day, was being discharged home with blood type AB  positive, hemoglobin 8, hematocrit 25 compared to admitting hemoglobin 9.3,  hematocrit 27.3.  Follow up is in one week for staple removal with routine  postsurgical instructions given.      Tilda Burrow, M.D.  Electronically Signed     JVF/MEDQ  D:  11/18/2004  T:  11/19/2004  Job:  [id number]

## 2010-06-04 NOTE — Op Note (Signed)
NAME:  Jackie Smith, Jackie Smith                       ACCOUNT NO.:  [id number]   MEDICAL RECORD NO.:  [id number]                   PATIENT TYPE:  INP   LOCATION:  A410                                 FACILITY:  [facility]   PHYSICIAN:  Tilda Burrow, M.D.              DATE OF BIRTH:  [birth year]   DATE OF PROCEDURE:  03/18/2003  DATE OF DISCHARGE:                                 OPERATIVE REPORT   PREOPERATIVE DIAGNOSES:  1. Pregnancy 39 weeks, failed induction.  2. Cephalopelvic disproportion.   POSTOPERATIVE DIAGNOSES:  1. Pregnancy 39 weeks, failed induction.  2. Cephalopelvic disproportion secondary to large infant.   PROCEDURE:  Primary low transverse cervical cesarean section.   SURGEON:  Tilda Burrow, M.D.   ASSISTANT:  Ward, RN   ANESTHESIA:  General anesthesia after unsuccessful spinal.   COMPLICATIONS:  None.   ESTIMATED BLOOD LOSS:  1200 cc.   FINDINGS:  An 8 pound 11.6 ounce female infant, Apgars 9 and 9 with markedly  molded vertex, loose nuchal cord x2, extensive varicosities of the lower  uterine segment resulting in significant increased EBL.   DETAILS OF PROCEDURE:  The patient was taken to the operating room, prepped  and draped for lower abdominal surgery after completion of spinal anesthesia  was unsuccessful.  The patient was then converted to general anesthesia  after unsuccessful wait of approximately 15 minutes for adequate analgesia.  Interestingly the spot that remained with full sensation in the right lower  quadrant was the same spot that did not work for the epidural.  The patient  then went on to a transverse uterine incision and the standard method of  Pfannenstiel with entry of the abdominal cavity in the midline and  development of bladder flap on the lower uterine segment.   A transverse uterine incision was made in the floppy lower uterine segment,  with extension laterally using index finger traction. The incision was made  a the level of  the fetal chin.  The operating physician's left hand was  introduced beneath the vertex.  The operators right hand was used to elevate  the shoulder anteriorly.  The vertex could then be rotated into the incision  a vacuum extractor placed on a vertex and fundal pressure applied with  vacuum extractor traction to deliver the infant in good condition. The  infant cried even before the body was out of the incision.  Bulb suctioning  was then performed.  The amniotic fluid was clear without malodor.   The cord was clamped and the infant passed to the waiting physician, Dr.  Milinda Cave for management.  See his notes for further details regarding the  baby.  Of significant note was the marked molding of the vertex and the  stocky build of the infant.  Vaginal delivery would not have been prudent in  this case.   We then proceeded to irrigate the abdomen, remove the placenta, irrigate the  uterus, and then place 4 Penningtons on the uterine incision to try to  control the worst of the bleeding.  The extensive varicosities and soft  floppy edematous tissue resulted in great blood loss.  The uterine closure  was a 2-layer closure with a single layer of running locking first layer and  a continuous running second layer.  Hemostasis was quite good.  Bladder flap  was loosely reapproximated with interrupted 2-0 chromic x2.  The abdomen was  irrigated.  The anterior peritone closed with 2-0 chromic.  The fascia  closed with #0 Vicryl.  The subcu tissues were approximated using 2  interrupted sutures of 2-0 plain with a JP drain allowed to exit through the  right side of the incision; and staple closure of the skin completed the  procedure with EBL 1200 cc.   Sponge and needle counts were correct.  There were no specimens to the lab.      ___________________________________________                                            Tilda Burrow, M.D.   JVF/MEDQ  D:  03/18/2003  T:  03/18/2003  Job:   [id number]

## 2010-06-08 NOTE — Op Note (Signed)
  Jackie Smith, Jackie Smith             ACCOUNT NO.:  [id number]  MEDICAL RECORD NO.:  [id number]           PATIENT TYPE:  O  LOCATION:  DAYP                          FACILITY:  [facility]  PHYSICIAN:  Lazaro Arms, M.D.   DATE OF BIRTH:  [birth year]  DATE OF PROCEDURE:  05/12/2010 DATE OF DISCHARGE:                              OPERATIVE REPORT   PREOPERATIVE DIAGNOSES:  Menometrorrhagia, dysmenorrhea.  POSTOPERATIVE DIAGNOSES:  Menometrorrhagia, dysmenorrhea.  PROCEDURE:  Hysteroscopy, D&C, endometrial ablation.  SURGEON:  Lazaro Arms, MD  ANESTHESIA:  General endotracheal.  FINDINGS:  The patient had normal endometrium.  No polyps, fibroids, abnormalities.  DESCRIPTION OF OPERATION:  The patient was taken to operating room, placed in supine position, underwent general endotracheal anesthesia, placed in lithotomy position, prepped and draped in usual sterile fashion.  Graves speculum was placed.  Cervix was grasped and dilated serially to allow passage of the hysteroscope.  Hysteroscopy was performed and found to be normal.  Vigorous uterine curettage was performed and tissue sent to pathology.  ThermaChoice III endometrial ablation balloon was used, 14 mL of D5W was required to maintain a pressure between 190 and 200 mmHg throughout the procedure, 87 degrees was the temperature, and 908 was the total therapy time.  All the equipment worked properly.  Ancef and Toradol was given prophylactically.  The patient was awakened from anesthesia, taken to recovery room in good and stable condition.  All counts were correct x3.     Lazaro Arms, M.D.     Loraine Maple  D:  05/12/2010  T:  05/13/2010  Job:  [id number]  Electronically Signed by Duane Lope M.D. on 06/08/2010 03:55:19 PM

## 2010-06-16 ENCOUNTER — Ambulatory Visit: Payer: Managed Care, Other (non HMO) | Admitting: Orthopedic Surgery

## 2010-08-02 ENCOUNTER — Inpatient Hospital Stay (INDEPENDENT_AMBULATORY_CARE_PROVIDER_SITE_OTHER)
Admission: RE | Admit: 2010-08-02 | Discharge: 2010-08-02 | Disposition: A | Discharge status: Home / Self Care | Payer: Managed Care, Other (non HMO) | Source: Ambulatory Visit | Attending: Family Medicine | Admitting: Family Medicine

## 2010-08-02 DIAGNOSIS — M79609 Pain in unspecified limb: Secondary | ICD-10-CM

## 2010-08-02 LAB — POCT URINALYSIS DIP (DEVICE)
Glucose, UA: NEGATIVE mg/dL
Nitrite: NEGATIVE
Protein, ur: NEGATIVE mg/dL
Specific Gravity, Urine: 1.015 (ref 1.005–1.030)
Urobilinogen, UA: 0.2 mg/dL (ref 0.0–1.0)

## 2010-10-11 LAB — URINALYSIS, ROUTINE W REFLEX MICROSCOPIC
Bilirubin Urine: NEGATIVE
Glucose, UA: NEGATIVE
Glucose, UA: NEGATIVE
Ketones, ur: NEGATIVE
Leukocytes, UA: NEGATIVE
Nitrite: POSITIVE — AB
Protein, ur: NEGATIVE
Specific Gravity, Urine: <1.005 — ABNORMAL LOW
Urobilinogen, UA: 0.2
pH: 6.5

## 2010-10-11 LAB — URINE MICROSCOPIC-ADD ON

## 2010-10-29 LAB — CBC
HCT: 39.6
Hemoglobin: 13.4
MCHC: 33.8
MCV: 81.4
RBC: 4.87
WBC: 7.4

## 2010-10-29 LAB — URINALYSIS, ROUTINE W REFLEX MICROSCOPIC
Bilirubin Urine: NEGATIVE
Bilirubin Urine: NEGATIVE
Glucose, UA: NEGATIVE
Ketones, ur: NEGATIVE
Ketones, ur: NEGATIVE
Leukocytes, UA: NEGATIVE
Nitrite: NEGATIVE
Protein, ur: NEGATIVE
Specific Gravity, Urine: >1.03 — ABNORMAL HIGH
Urobilinogen, UA: 0.2
Urobilinogen, UA: 0.2

## 2010-10-29 LAB — GC/CHLAMYDIA PROBE AMP, GENITAL
Chlamydia, DNA Probe: NEGATIVE
GC Probe Amp, Genital: NEGATIVE

## 2010-10-29 LAB — WET PREP, GENITAL: Yeast Wet Prep HPF POC: NONE SEEN

## 2010-10-29 LAB — COMPREHENSIVE METABOLIC PANEL
AST: 60 — ABNORMAL HIGH
CO2: 29
Calcium: 9.7
Chloride: 106
Creatinine, Ser: 0.78
GFR calc Af Amer: >60
GFR calc non Af Amer: >60
Glucose, Bld: 93
Total Bilirubin: 0.4

## 2010-10-29 LAB — DIFFERENTIAL
Basophils Absolute: 0
Eosinophils Absolute: 0.2
Eosinophils Relative: 2
Lymphocytes Relative: 35
Neutrophils Relative %: 52

## 2010-10-29 LAB — URINE MICROSCOPIC-ADD ON

## 2010-10-29 LAB — PREGNANCY, URINE: Preg Test, Ur: NEGATIVE

## 2011-06-10 ENCOUNTER — Ambulatory Visit
Admission: RE | Admit: 2011-06-10 | Discharge: 2011-06-10 | Disposition: A | Discharge status: Home / Self Care | Payer: Managed Care, Other (non HMO) | Source: Ambulatory Visit | Attending: Family Medicine | Admitting: Family Medicine

## 2011-06-10 ENCOUNTER — Other Ambulatory Visit (HOSPITAL_COMMUNITY): Payer: Self-pay | Admitting: Family Medicine

## 2011-06-10 ENCOUNTER — Other Ambulatory Visit: Payer: Self-pay | Admitting: Family Medicine

## 2011-06-10 DIAGNOSIS — N644 Mastodynia: Secondary | ICD-10-CM

## 2011-11-02 IMAGING — CR DG FOREARM 2V*R*
3 series · 3 of 3 positions shown · non-contrast
Comparison: Right hand series from the same day.

CLINICAL DATA: 25-year-old female status post fall down steps.
Pain.

RIGHT FOREARM - 2 VIEW

[view not recorded (1 of 3)]
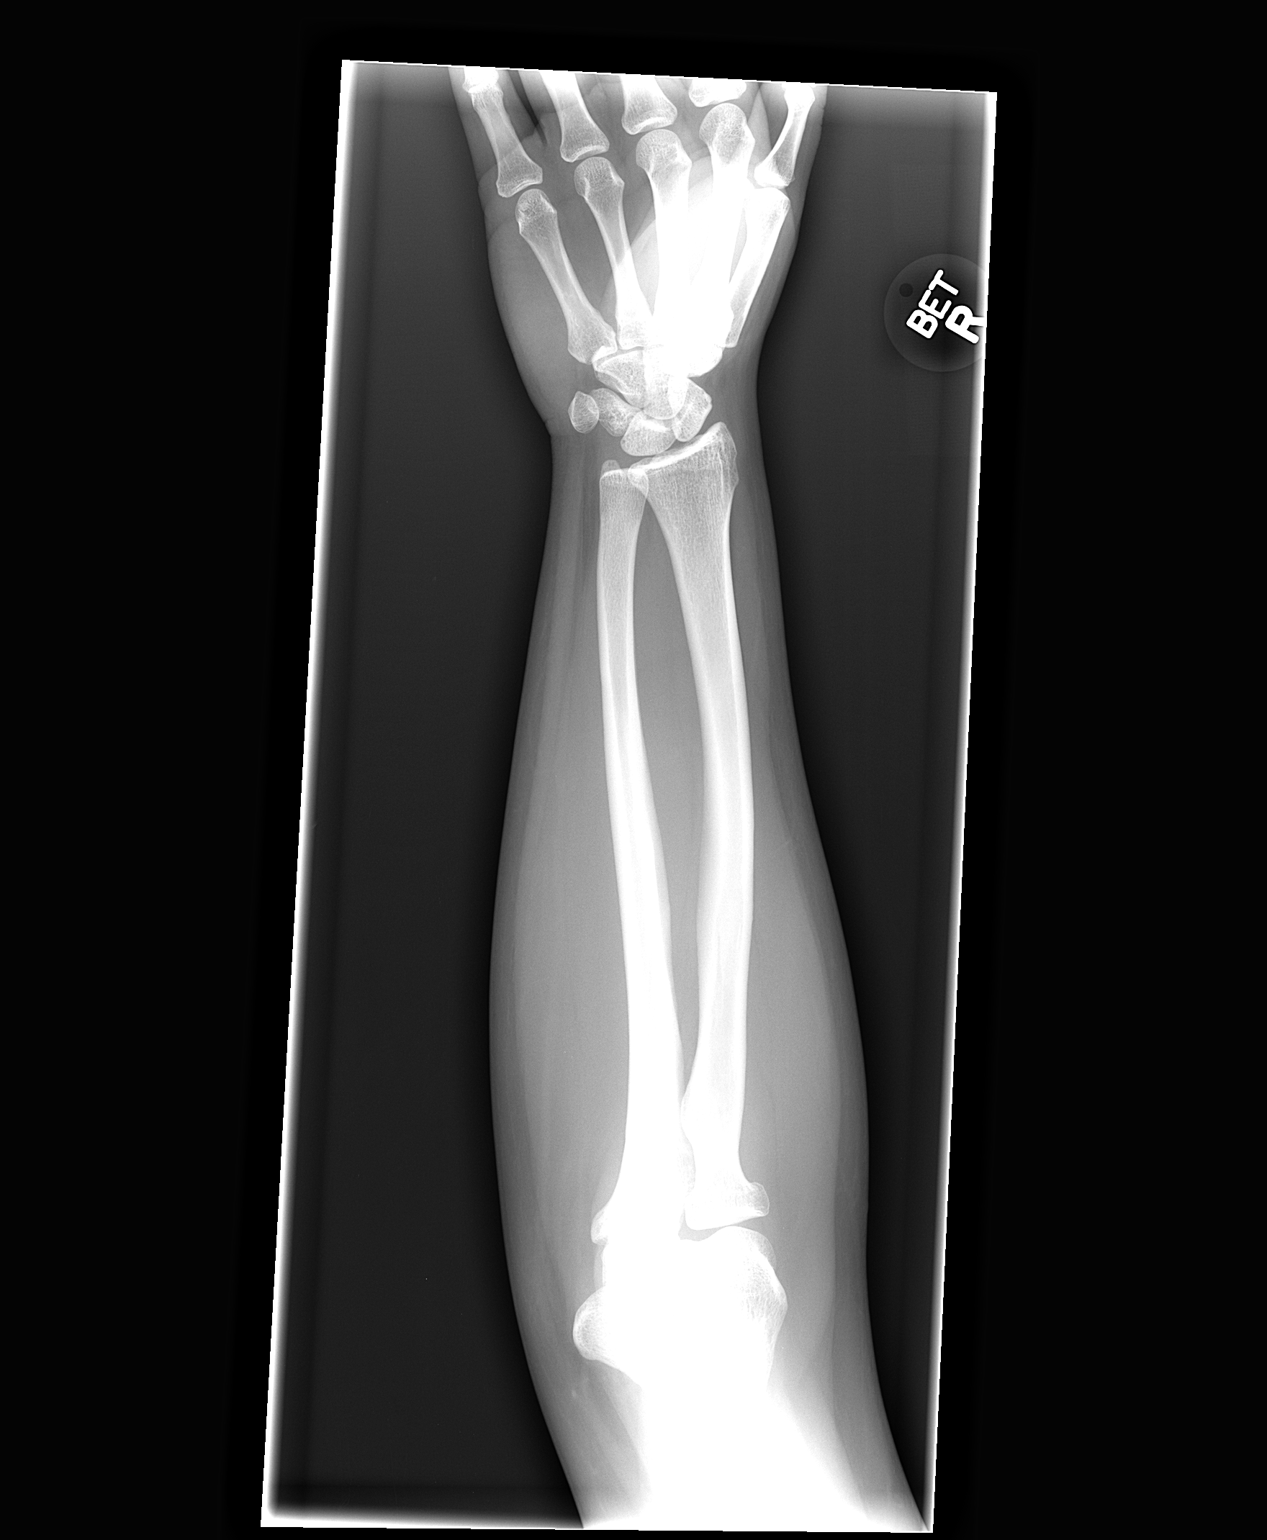

[view not recorded (2 of 3)]
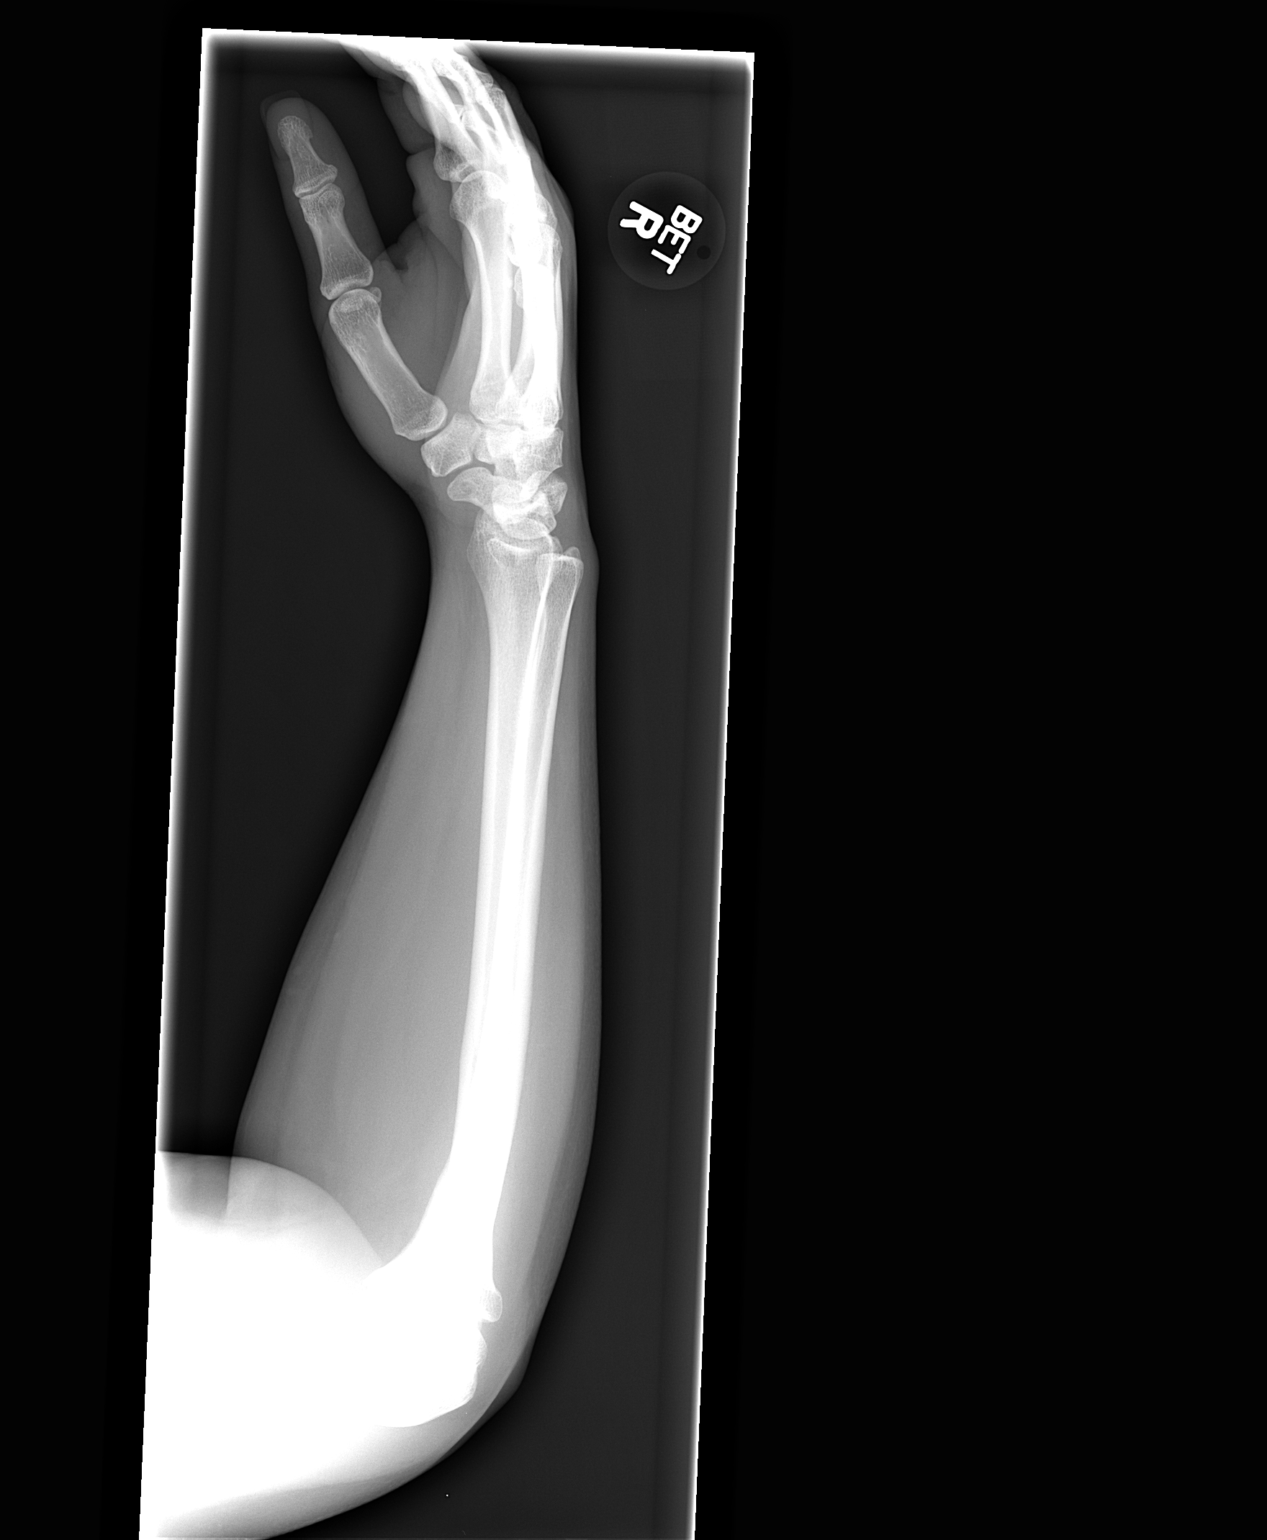

[view not recorded (3 of 3)]
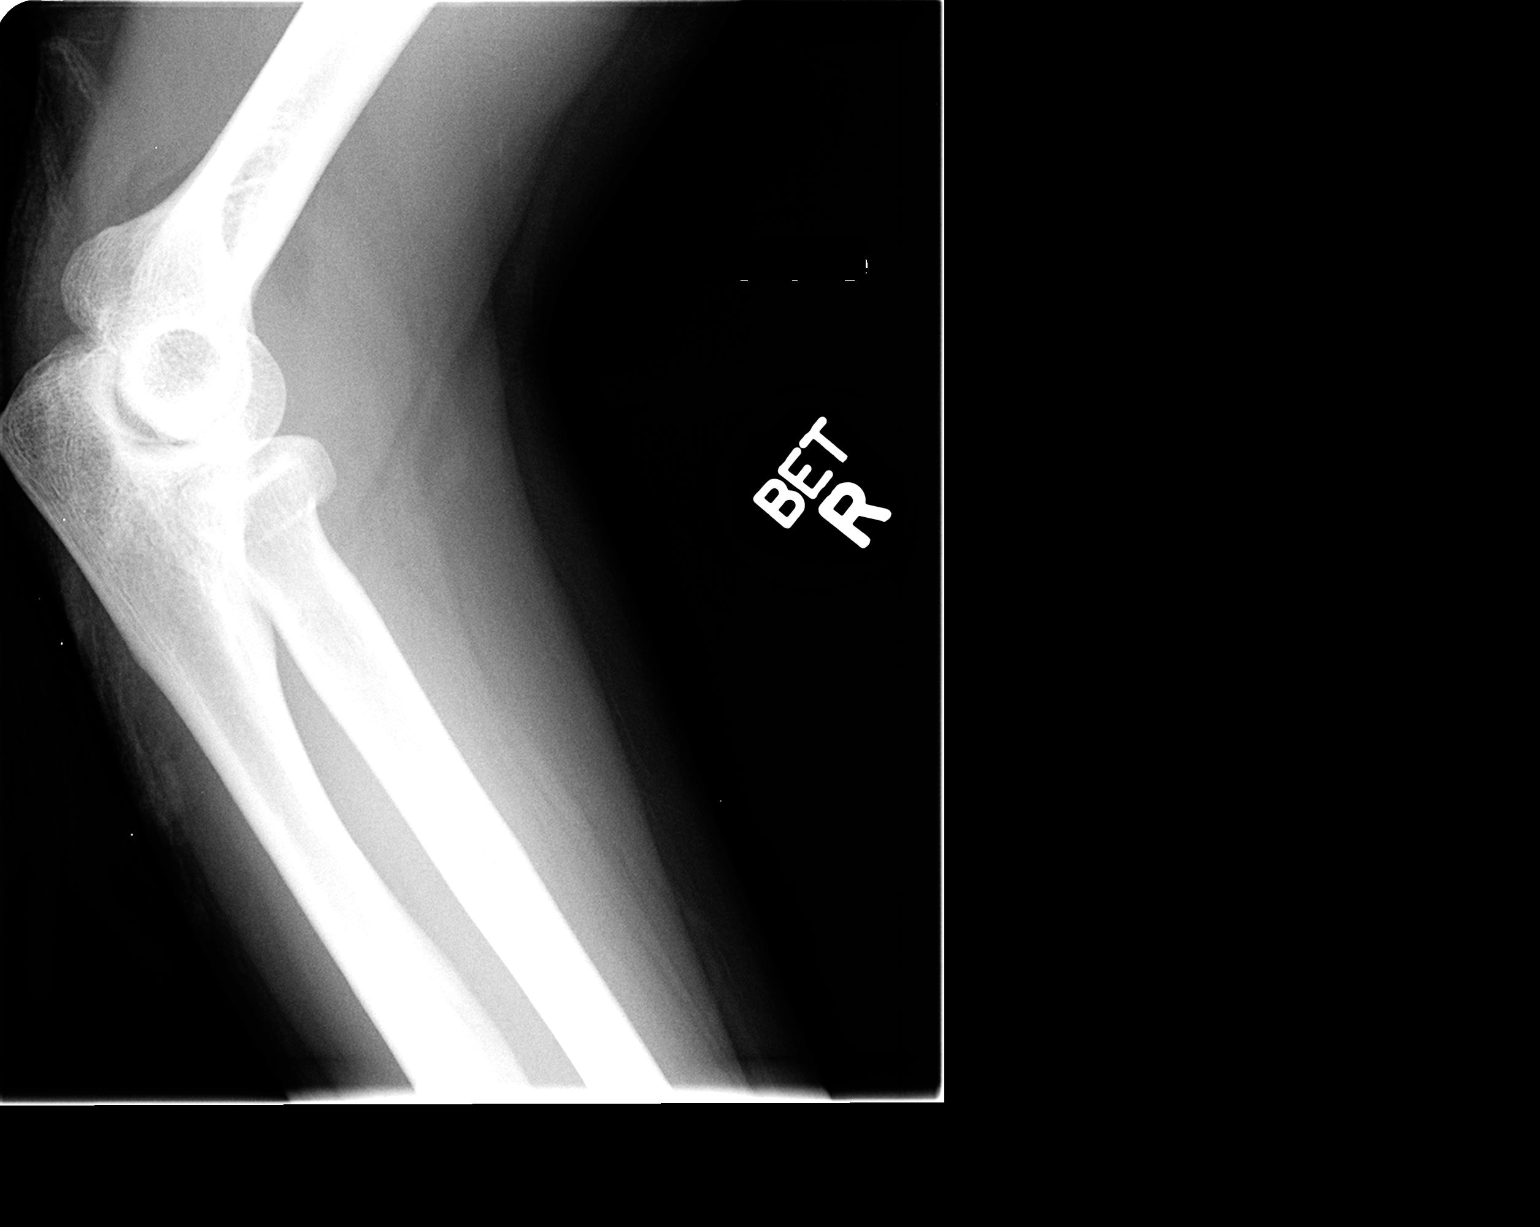

[3 of 3 positions shown; findings below may reference images not displayed]

FINDINGS: Cross-table lateral view of the elbow suggests a joint
effusion.  The radial head appears grossly intact.  Distal humerus
appears grossly intact.  Proximal ulna appears intact.

Mid and distal radius and ulna appear intact and within normal
limits.  Grossly normal alignment at the right wrist.
IMPRESSION: 1.  Findings suspicious for a nondisplaced fracture at the right
elbow.  Recommend dedicated elbow series.
2.  No other acute fracture or dislocation identified in the right
forearm.

## 2012-01-02 ENCOUNTER — Encounter (HOSPITAL_BASED_OUTPATIENT_CLINIC_OR_DEPARTMENT_OTHER): Payer: Self-pay | Admitting: *Deleted

## 2012-01-02 ENCOUNTER — Emergency Department (HOSPITAL_BASED_OUTPATIENT_CLINIC_OR_DEPARTMENT_OTHER)
Admission: EM | Admit: 2012-01-02 | Discharge: 2012-01-02 | Disposition: A | Discharge status: Home / Self Care | Payer: Managed Care, Other (non HMO) | Attending: Emergency Medicine | Admitting: Emergency Medicine

## 2012-01-02 DIAGNOSIS — Z87442 Personal history of urinary calculi: Secondary | ICD-10-CM | POA: Insufficient documentation

## 2012-01-02 DIAGNOSIS — Z8719 Personal history of other diseases of the digestive system: Secondary | ICD-10-CM | POA: Insufficient documentation

## 2012-01-02 DIAGNOSIS — R55 Syncope and collapse: Secondary | ICD-10-CM

## 2012-01-02 DIAGNOSIS — F17200 Nicotine dependence, unspecified, uncomplicated: Secondary | ICD-10-CM | POA: Insufficient documentation

## 2012-01-02 DIAGNOSIS — Z87440 Personal history of urinary (tract) infections: Secondary | ICD-10-CM | POA: Insufficient documentation

## 2012-01-02 DIAGNOSIS — G43909 Migraine, unspecified, not intractable, without status migrainosus: Secondary | ICD-10-CM

## 2012-01-02 DIAGNOSIS — R112 Nausea with vomiting, unspecified: Secondary | ICD-10-CM | POA: Insufficient documentation

## 2012-01-02 LAB — CBC WITH DIFFERENTIAL/PLATELET
Basophils Absolute: 0 10*3/uL (ref 0.0–0.1)
Basophils Relative: 0 % (ref 0–1)
Eosinophils Absolute: 0.2 K/uL (ref 0.0–0.7)
Eosinophils Relative: 4 % (ref 0–5)
HCT: 35.4 % — ABNORMAL LOW (ref 36.0–46.0)
Hemoglobin: 12.6 g/dL (ref 12.0–15.0)
Lymphocytes Relative: 40 % (ref 12–46)
Lymphs Abs: 2 10*3/uL (ref 0.7–4.0)
MCH: 31.6 pg (ref 26.0–34.0)
MCHC: 35.6 g/dL (ref 30.0–36.0)
MCV: 88.7 fL (ref 78.0–100.0)
Monocytes Absolute: 0.4 10*3/uL (ref 0.1–1.0)
Monocytes Relative: 9 % (ref 3–12)
Neutro Abs: 2.3 10*3/uL (ref 1.7–7.7)
Neutrophils Relative %: 47 % (ref 43–77)
Platelets: 167 K/uL (ref 150–400)
RBC: 3.99 MIL/uL (ref 3.87–5.11)
RDW: 12.2 % (ref 11.5–15.5)
WBC: 4.9 10*3/uL (ref 4.0–10.5)

## 2012-01-02 LAB — COMPREHENSIVE METABOLIC PANEL
AST: 19 U/L (ref 0–37)
BUN: 7 mg/dL (ref 6–23)
CO2: 23 mEq/L (ref 19–32)
Chloride: 106 mEq/L (ref 96–112)
Creatinine, Ser: 0.7 mg/dL (ref 0.50–1.10)
GFR calc non Af Amer: >90 mL/min (ref >90)
Glucose, Bld: 96 mg/dL (ref 70–99)
Total Bilirubin: 0.2 mg/dL — ABNORMAL LOW (ref 0.3–1.2)

## 2012-01-02 LAB — URINALYSIS, ROUTINE W REFLEX MICROSCOPIC
Bilirubin Urine: NEGATIVE
Glucose, UA: NEGATIVE mg/dL
Hgb urine dipstick: NEGATIVE
Ketones, ur: NEGATIVE mg/dL
Leukocytes, UA: NEGATIVE
Nitrite: NEGATIVE
Protein, ur: NEGATIVE mg/dL
Specific Gravity, Urine: 1.009 (ref 1.005–1.030)
Urobilinogen, UA: 1 mg/dL (ref 0.0–1.0)
pH: 6.5 (ref 5.0–8.0)

## 2012-01-02 LAB — PREGNANCY, URINE: Preg Test, Ur: NEGATIVE

## 2012-01-02 LAB — COMPREHENSIVE METABOLIC PANEL WITH GFR
ALT: 19 U/L (ref 0–35)
Albumin: 3.8 g/dL (ref 3.5–5.2)
Alkaline Phosphatase: 54 U/L (ref 39–117)
Calcium: 9 mg/dL (ref 8.4–10.5)
GFR calc Af Amer: >90 mL/min (ref >90)
Potassium: 3.5 meq/L (ref 3.5–5.1)
Sodium: 138 meq/L (ref 135–145)
Total Protein: 7 g/dL (ref 6.0–8.3)

## 2012-01-02 MED ORDER — SODIUM CHLORIDE 0.9 % IV BOLUS (SEPSIS)
1000.0000 mL | Freq: Once | INTRAVENOUS | Status: AC
  Administered 2012-01-02: 1000 mL via INTRAVENOUS

## 2012-01-02 MED ORDER — METOCLOPRAMIDE HCL 5 MG/ML IJ SOLN
10.0000 mg | Freq: Once | INTRAMUSCULAR | Status: AC
  Administered 2012-01-02: 10 mg via INTRAVENOUS
  Filled 2012-01-02: qty 2

## 2012-01-02 MED ORDER — KETOROLAC TROMETHAMINE 30 MG/ML IJ SOLN
30.0000 mg | Freq: Once | INTRAMUSCULAR | Status: AC
  Administered 2012-01-02: 30 mg via INTRAVENOUS
  Filled 2012-01-02: qty 1

## 2012-01-02 MED ORDER — DIPHENHYDRAMINE HCL 50 MG/ML IJ SOLN
12.5000 mg | Freq: Once | INTRAMUSCULAR | Status: AC
  Administered 2012-01-02: 12.5 mg via INTRAVENOUS
  Filled 2012-01-02: qty 1

## 2012-01-02 NOTE — ED Notes (Signed)
Brought in by ems from work syncopal episode after migraine

## 2012-01-02 NOTE — ED Notes (Signed)
Pt also reports breast tenderness and milky d/c-noticed yesterday

## 2012-01-02 NOTE — Discharge Instructions (Signed)
 Migraine Headache A migraine headache is an intense, throbbing pain on one or both sides of your head. A migraine can last for 30 minutes to several hours. CAUSES  The exact cause of a migraine headache is not always known. However, a migraine may be caused when nerves in the brain become irritated and release chemicals that cause inflammation. This causes pain. SYMPTOMS  Pain on one or both sides of your head.  Pulsating or throbbing pain.  Severe pain that prevents daily activities.  Pain that is aggravated by any physical activity.  Nausea, vomiting, or both.  Dizziness.  Pain with exposure to bright lights, loud noises, or activity.  General sensitivity to bright lights, loud noises, or smells. Before you get a migraine, you may get warning signs that a migraine is coming (aura). An aura may include:  Seeing flashing lights.  Seeing bright spots, halos, or zig-zag lines.  Having tunnel vision or blurred vision.  Having feelings of numbness or tingling.  Having trouble talking.  Having muscle weakness. MIGRAINE TRIGGERS  Alcohol.  Smoking.  Stress.  Menstruation.  Aged cheeses.  Foods or drinks that contain nitrates, glutamate, aspartame, or tyramine.  Lack of sleep.  Chocolate.  Caffeine.  Hunger.  Physical exertion.  Fatigue.  Medicines used to treat chest pain (nitroglycerine), birth control pills, estrogen, and some blood pressure medicines. DIAGNOSIS  A migraine headache is often diagnosed based on:  Symptoms.  Physical examination.  A CT scan or MRI of your head. TREATMENT Medicines may be given for pain and nausea. Medicines can also be given to help prevent recurrent migraines.  HOME CARE INSTRUCTIONS  Only take over-the-counter or prescription medicines for pain or discomfort as directed by your caregiver. The use of long-term narcotics is not recommended.  Lie down in a dark, quiet room when you have a migraine.  Keep a journal  to find out what may trigger your migraine headaches. For example, write down:  What you eat and drink.  How much sleep you get.  Any change to your diet or medicines.  Limit alcohol consumption.  Quit smoking if you smoke.  Get 7 to 9 hours of sleep, or as recommended by your caregiver.  Limit stress.  Keep lights dim if bright lights bother you and make your migraines worse. SEEK IMMEDIATE MEDICAL CARE IF:   Your migraine becomes severe.  You have a fever.  You have a stiff neck.  You have vision loss.  You have muscular weakness or loss of muscle control.  You start losing your balance or have trouble walking.  You feel faint or pass out.  You have severe symptoms that are different from your first symptoms. MAKE SURE YOU:   Understand these instructions.  Will watch your condition.  Will get help right away if you are not doing well or get worse. Document Released: 01/03/2005 Document Revised: 03/28/2011 Document Reviewed: 12/24/2010 [facility] Patient Information 2013 [place], [place].    Syncope Syncope is a fainting spell. This means the person loses consciousness and drops to the ground. The person is generally unconscious for less than 5 minutes. The person may have some muscle twitches for up to 15 seconds before waking up and returning to normal. Syncope occurs more often in elderly people, but it can happen to anyone. While most causes of syncope are not dangerous, syncope can be a sign of a serious medical problem. It is important to seek medical care.  CAUSES  Syncope is caused by a  sudden decrease in blood flow to the brain. The specific cause is often not determined. Factors that can trigger syncope include:  Taking medicines that lower blood pressure.  Sudden changes in posture, such as standing up suddenly.  Taking more medicine than prescribed.  Standing in one place for too long.  Seizure disorders.  Dehydration and excessive exposure to  heat.  Low blood sugar (hypoglycemia).  Straining to have a bowel movement.  Heart disease, irregular heartbeat, or other circulatory problems.  Fear, emotional distress, seeing blood, or severe pain. SYMPTOMS  Right before fainting, you may:  Feel dizzy or lightheaded.  Feel nauseous.  See all white or all black in your field of vision.  Have cold, clammy skin. DIAGNOSIS  Your caregiver will ask about your symptoms, perform a physical exam, and perform electrocardiography (ECG) to record the electrical activity of your heart. Your caregiver may also perform other heart or blood tests to determine the cause of your syncope. TREATMENT  In most cases, no treatment is needed. Depending on the cause of your syncope, your caregiver may recommend changing or stopping some of your medicines. HOME CARE INSTRUCTIONS  Have someone stay with you until you feel stable.  Do not drive, operate machinery, or play sports until your caregiver says it is okay.  Keep all follow-up appointments as directed by your caregiver.  Lie down right away if you start feeling like you might faint. Breathe deeply and steadily. Wait until all the symptoms have passed.  Drink enough fluids to keep your urine clear or pale yellow.  If you are taking blood pressure or heart medicine, get up slowly, taking several minutes to sit and then stand. This can reduce dizziness. SEEK IMMEDIATE MEDICAL CARE IF:   You have a severe headache.  You have unusual pain in the chest, abdomen, or back.  You are bleeding from the mouth or rectum, or you have black or tarry stool.  You have an irregular or very fast heartbeat.  You have pain with breathing.  You have repeated fainting or seizure-like jerking during an episode.  You faint when sitting or lying down.  You have confusion.  You have difficulty walking.  You have severe weakness.  You have vision problems. If you fainted, call your local emergency  services (911 in [place] Do not drive yourself to the hospital.  MAKE SURE YOU:  Understand these instructions.  Will watch your condition.  Will get help right away if you are not doing well or get worse. Document Released: 01/03/2005 Document Revised: 07/05/2011 Document Reviewed: 03/04/2011 [facility] Patient Information 2013 [place], [place].

## 2012-01-02 NOTE — ED Provider Notes (Signed)
History   This chart was scribed for Gavin Pound. Oletta Lamas, MD, by Frederik Pear, ER scribe. The patient was seen in room MH10/MH10 and the patient's care was started at 1744.    CSN: [id number]  Arrival date & time 01/02/12  1701   First MD Initiated Contact with Patient 01/02/12 1744      Chief Complaint  Patient presents with  . Loss of Consciousness  . Migraine    (Consider location/radiation/quality/duration/timing/severity/associated sxs/prior treatment) HPI Comments: Jackie Smith is a 26 y.o. female brought in by ambulance, who presents to the Emergency Department complaining of a constant, moderate migraine that began a couple of days ago with an associated syncopal episode at work PTA. She states that she had been feeling nauseated and and went to the bathroom and had a bloodless BM and urine. She reports that she got lightheaded and felt that she was going to pass out so she went to the bathroom and fell off the toilet and into the floor. She denies any SOB or chest pain. She states that she had a laser ablation of there cervix in 2011. She reports that she lost 50 lbs several years ago without trying as well as noticed gradually increasing GERD. She has a h/o of cervical cancer and migraines in her family. She states that she receives regular biannual pap smears due to a h/o dysplasia, but has had all normal ones recently. She reports that all three of her children are currently sick with cough and congestions, but she denies any of the symptoms.      Past Medical History  Diagnosis Date  . Gastritis   . Acid reflux   . Frequent UTI   . Infections of kidney     Past Surgical History  Procedure Date  . Dilation and curettage of uterus   . Induced abortion   . Laser ablation of the cervix   . C sections     Family History  Problem Relation Age of Onset  . Heart disease    . Arthritis    . Cancer    . Asthma    . Diabetes      History  Substance Use Topics  .  Smoking status: Current Every Day Smoker -- 1.0 packs/day  . Smokeless tobacco: Not on file  . Alcohol Use: Yes    OB History    Grav Para Term Preterm Abortions TAB SAB Ect Mult Living                  Review of Systems  Gastrointestinal: Positive for nausea. Negative for vomiting and diarrhea.  Neurological: Positive for syncope and headaches.  All other systems reviewed and are negative.    Allergies  Imitrex  Home Medications   Current Outpatient Rx  Name  Route  Sig  Dispense  Refill  . OXYCODONE-ACETAMINOPHEN 5-325 MG PO TABS   Oral   Take 1 tablet by mouth every 6 (six) hours as needed.             BP 114/69  Pulse 82  Temp 98.7 F (37.1 C) (Oral)  Resp 16  SpO2 100%  Physical Exam  Nursing note and vitals reviewed. Constitutional: She is oriented to person, place, and time.  HENT:  Head: Normocephalic and atraumatic.  Cardiovascular: Normal rate, regular rhythm and normal heart sounds.   No murmur heard. Pulmonary/Chest: Effort normal and breath sounds normal. No respiratory distress.  Musculoskeletal: Normal range of motion.  Neurological:  She is alert and oriented to person, place, and time. No cranial nerve deficit. Coordination normal.       Her heel to shin is normal.  Skin: Skin is warm and dry. Rash noted.       Her cap refill is normal. She had a small, raised, nondraining papular area on her lower abdomen.  Psychiatric: She has a normal mood and affect. Thought content normal.    ED Course  Procedures (including critical care time)  DIAGNOSTIC STUDIES: Oxygen Saturation is 100% on room air, normal by my interpretation.    COORDINATION OF CARE:  18:10- Discussed planned course of treatment with the patient, including an IV, blood work, and UA, who is agreeable at this time.  Results for orders placed during the hospital encounter of 01/02/12  URINALYSIS, ROUTINE W REFLEX MICROSCOPIC      Component Value Range   Color, Urine YELLOW   YELLOW   APPearance CLOUDY (*) CLEAR   Specific Gravity, Urine 1.009  1.005 - 1.030   pH 6.5  5.0 - 8.0   Glucose, UA NEGATIVE  NEGATIVE mg/dL   Hgb urine dipstick NEGATIVE  NEGATIVE   Bilirubin Urine NEGATIVE  NEGATIVE   Ketones, ur NEGATIVE  NEGATIVE mg/dL   Protein, ur NEGATIVE  NEGATIVE mg/dL   Urobilinogen, UA 1.0  0.0 - 1.0 mg/dL   Nitrite NEGATIVE  NEGATIVE   Leukocytes, UA NEGATIVE  NEGATIVE  PREGNANCY, URINE      Component Value Range   Preg Test, Ur NEGATIVE  NEGATIVE  CBC WITH DIFFERENTIAL      Component Value Range   WBC 4.9  4.0 - 10.5 K/uL   RBC 3.99  3.87 - 5.11 MIL/uL   Hemoglobin 12.6  12.0 - 15.0 g/dL   HCT 95.6 (*) 21.3 - 08.6 %   MCV 88.7  78.0 - 100.0 fL   MCH 31.6  26.0 - 34.0 pg   MCHC 35.6  30.0 - 36.0 g/dL   RDW 57.8  46.9 - 62.9 %   Platelets 167  150 - 400 K/uL   Neutrophils Relative 47  43 - 77 %   Neutro Abs 2.3  1.7 - 7.7 K/uL   Lymphocytes Relative 40  12 - 46 %   Lymphs Abs 2.0  0.7 - 4.0 K/uL   Monocytes Relative 9  3 - 12 %   Monocytes Absolute 0.4  0.1 - 1.0 K/uL   Eosinophils Relative 4  0 - 5 %   Eosinophils Absolute 0.2  0.0 - 0.7 K/uL   Basophils Relative 0  0 - 1 %   Basophils Absolute 0.0  0.0 - 0.1 K/uL  COMPREHENSIVE METABOLIC PANEL      Component Value Range   Sodium 138  135 - 145 mEq/L   Potassium 3.5  3.5 - 5.1 mEq/L   Chloride 106  96 - 112 mEq/L   CO2 23  19 - 32 mEq/L   Glucose, Bld 96  70 - 99 mg/dL   BUN 7  6 - 23 mg/dL   Creatinine, Ser 5.28  0.50 - 1.10 mg/dL   Calcium 9.0  8.4 - 41.3 mg/dL   Total Protein 7.0  6.0 - 8.3 g/dL   Albumin 3.8  3.5 - 5.2 g/dL   AST 19  0 - 37 U/L   ALT 19  0 - 35 U/L   Alkaline Phosphatase 54  39 - 117 U/L   Total Bilirubin 0.2 (*) 0.3 - 1.2 mg/dL  GFR calc non Af Amer >90  >90 mL/min   GFR calc Af Amer >90  >90 mL/min     Labs Reviewed  URINALYSIS, ROUTINE W REFLEX MICROSCOPIC - Abnormal; Notable for the following:    APPearance CLOUDY (*)     All other components within  normal limits  CBC WITH DIFFERENTIAL - Abnormal; Notable for the following:    HCT 35.4 (*)     All other components within normal limits  COMPREHENSIVE METABOLIC PANEL - Abnormal; Notable for the following:    Total Bilirubin 0.2 (*)     All other components within normal limits  PREGNANCY, URINE   No results found.   1. Syncope   2. Migraine     ECG at time 17:52 shows NSR at rate 66, normal axis, no ST abn's, normal intervals.  Non specific T wave abn anterior leads, no prior ECG's available.    MDM  I personally performed the services described in this documentation, which was scribed in my presence. The recorded information has been reviewed and is accurate.    Pt with episode of dizziness, light headed, syncope in bathroom.  No evidence of seizure activity.   Work up here is neg.  No arrythmia on ECG, electrolytes normal, Hgb and HCG are ok.  IVF's and IV meds for migraine given with some symptomatic improvement.  Non focal neuro exam.  No need for head CT.  D/c home.         Gavin Pound. Oletta Lamas, MD 01/02/12 [facility] Brunner

## 2012-02-13 ENCOUNTER — Other Ambulatory Visit (HOSPITAL_COMMUNITY): Payer: Self-pay | Admitting: Family Medicine

## 2012-02-13 DIAGNOSIS — Z09 Encounter for follow-up examination after completed treatment for conditions other than malignant neoplasm: Secondary | ICD-10-CM

## 2012-02-17 ENCOUNTER — Ambulatory Visit (HOSPITAL_COMMUNITY)
Admission: RE | Admit: 2012-02-17 | Discharge: 2012-02-17 | Disposition: A | Discharge status: Home / Self Care | Payer: Managed Care, Other (non HMO) | Source: Ambulatory Visit | Attending: Family Medicine | Admitting: Family Medicine

## 2012-02-17 DIAGNOSIS — Z09 Encounter for follow-up examination after completed treatment for conditions other than malignant neoplasm: Secondary | ICD-10-CM

## 2012-02-17 DIAGNOSIS — Z1231 Encounter for screening mammogram for malignant neoplasm of breast: Secondary | ICD-10-CM | POA: Insufficient documentation

## 2013-02-13 ENCOUNTER — Emergency Department (HOSPITAL_COMMUNITY)
Admission: EM | Admit: 2013-02-13 | Discharge: 2013-02-13 | Discharge status: Against Medical Advice | Payer: Managed Care, Other (non HMO) | Attending: Emergency Medicine | Admitting: Emergency Medicine

## 2013-02-13 ENCOUNTER — Encounter (HOSPITAL_COMMUNITY): Payer: Self-pay | Admitting: Emergency Medicine

## 2013-02-13 DIAGNOSIS — R197 Diarrhea, unspecified: Secondary | ICD-10-CM | POA: Insufficient documentation

## 2013-02-13 DIAGNOSIS — R58 Hemorrhage, not elsewhere classified: Secondary | ICD-10-CM | POA: Insufficient documentation

## 2013-02-13 DIAGNOSIS — R509 Fever, unspecified: Secondary | ICD-10-CM | POA: Insufficient documentation

## 2013-02-13 LAB — URINALYSIS, ROUTINE W REFLEX MICROSCOPIC
Bilirubin Urine: NEGATIVE
Glucose, UA: NEGATIVE mg/dL
HGB URINE DIPSTICK: NEGATIVE
Ketones, ur: NEGATIVE mg/dL
LEUKOCYTES UA: NEGATIVE
NITRITE: NEGATIVE
PROTEIN: NEGATIVE mg/dL
SPECIFIC GRAVITY, URINE: 1.019 (ref 1.005–1.030)
UROBILINOGEN UA: 1 mg/dL (ref 0.0–1.0)
pH: 7 (ref 5.0–8.0)

## 2013-02-13 LAB — CBC WITH DIFFERENTIAL/PLATELET
BASOS ABS: 0 10*3/uL (ref 0.0–0.1)
Basophils Relative: 1 % (ref 0–1)
EOS PCT: 1 % (ref 0–5)
Eosinophils Absolute: 0.1 10*3/uL (ref 0.0–0.7)
HEMATOCRIT: 38.3 % (ref 36.0–46.0)
HEMOGLOBIN: 13.7 g/dL (ref 12.0–15.0)
LYMPHS PCT: 29 % (ref 12–46)
Lymphs Abs: 1.8 10*3/uL (ref 0.7–4.0)
MCH: 32.2 pg (ref 26.0–34.0)
MCHC: 35.8 g/dL (ref 30.0–36.0)
MCV: 90.1 fL (ref 78.0–100.0)
MONO ABS: 0.4 10*3/uL (ref 0.1–1.0)
MONOS PCT: 7 % (ref 3–12)
NEUTROS ABS: 3.9 10*3/uL (ref 1.7–7.7)
Neutrophils Relative %: 63 % (ref 43–77)
Platelets: 200 10*3/uL (ref 150–400)
RBC: 4.25 MIL/uL (ref 3.87–5.11)
RDW: 12.5 % (ref 11.5–15.5)
WBC: 6.2 10*3/uL (ref 4.0–10.5)

## 2013-02-13 LAB — COMPREHENSIVE METABOLIC PANEL
ALT: 21 U/L (ref 0–35)
AST: 19 U/L (ref 0–37)
Albumin: 3.9 g/dL (ref 3.5–5.2)
Alkaline Phosphatase: 59 U/L (ref 39–117)
BUN: 9 mg/dL (ref 6–23)
CALCIUM: 9 mg/dL (ref 8.4–10.5)
CHLORIDE: 103 meq/L (ref 96–112)
CO2: 27 meq/L (ref 19–32)
CREATININE: 0.78 mg/dL (ref 0.50–1.10)
GLUCOSE: 91 mg/dL (ref 70–99)
Potassium: 3.9 mEq/L (ref 3.7–5.3)
Sodium: 141 mEq/L (ref 137–147)
Total Protein: 7.1 g/dL (ref 6.0–8.3)

## 2013-02-13 NOTE — ED Notes (Signed)
Pt reports diarrhea and fever today and yesterday. States she had a regular BM today and afterwards started pouring blood. States she does not have periods anymore from getting an ablation done.

## 2013-02-13 NOTE — ED Notes (Signed)
Pt did not answer.

## 2013-05-01 ENCOUNTER — Encounter (HOSPITAL_COMMUNITY): Payer: Self-pay | Admitting: Emergency Medicine

## 2013-05-01 ENCOUNTER — Emergency Department (HOSPITAL_COMMUNITY)
Admission: EM | Admit: 2013-05-01 | Discharge: 2013-05-02 | Disposition: A | Discharge status: Home / Self Care | Payer: Managed Care, Other (non HMO) | Attending: Emergency Medicine | Admitting: Emergency Medicine

## 2013-05-01 DIAGNOSIS — Z8619 Personal history of other infectious and parasitic diseases: Secondary | ICD-10-CM | POA: Insufficient documentation

## 2013-05-01 DIAGNOSIS — Z87440 Personal history of urinary (tract) infections: Secondary | ICD-10-CM | POA: Insufficient documentation

## 2013-05-01 DIAGNOSIS — N12 Tubulo-interstitial nephritis, not specified as acute or chronic: Secondary | ICD-10-CM | POA: Insufficient documentation

## 2013-05-01 DIAGNOSIS — R51 Headache: Secondary | ICD-10-CM | POA: Insufficient documentation

## 2013-05-01 DIAGNOSIS — Z8719 Personal history of other diseases of the digestive system: Secondary | ICD-10-CM | POA: Insufficient documentation

## 2013-05-01 DIAGNOSIS — Z87891 Personal history of nicotine dependence: Secondary | ICD-10-CM | POA: Insufficient documentation

## 2013-05-01 DIAGNOSIS — R0602 Shortness of breath: Secondary | ICD-10-CM | POA: Insufficient documentation

## 2013-05-01 LAB — URINALYSIS, ROUTINE W REFLEX MICROSCOPIC
Bilirubin Urine: NEGATIVE
Glucose, UA: NEGATIVE mg/dL
Hgb urine dipstick: NEGATIVE
KETONES UR: NEGATIVE mg/dL
NITRITE: POSITIVE — AB
PROTEIN: NEGATIVE mg/dL
Specific Gravity, Urine: 1.015 (ref 1.005–1.030)
UROBILINOGEN UA: 0.2 mg/dL (ref 0.0–1.0)
pH: 5.5 (ref 5.0–8.0)

## 2013-05-01 LAB — URINE MICROSCOPIC-ADD ON

## 2013-05-01 LAB — PREGNANCY, URINE: PREG TEST UR: NEGATIVE

## 2013-05-01 MED ORDER — HYDROCODONE-ACETAMINOPHEN 5-325 MG PO TABS: 2.0000 | ORAL_TABLET | ORAL | Status: DC | PRN

## 2013-05-01 MED ORDER — CIPROFLOXACIN HCL 500 MG PO TABS
500.0000 mg | ORAL_TABLET | Freq: Two times a day (BID) | ORAL | Status: DC
Start: 2013-05-01 — End: 2014-01-23

## 2013-05-01 MED ORDER — KETOROLAC TROMETHAMINE 30 MG/ML IJ SOLN
30.0000 mg | Freq: Once | INTRAMUSCULAR | Status: AC
  Administered 2013-05-01: 30 mg via INTRAVENOUS
  Filled 2013-05-01: qty 1

## 2013-05-01 MED ORDER — ONDANSETRON 4 MG PO TBDP: 4.0000 mg | ORAL_TABLET | Freq: Three times a day (TID) | ORAL | Status: DC | PRN

## 2013-05-01 MED ORDER — HYDROCODONE-ACETAMINOPHEN 5-325 MG PO TABS
2.0000 | ORAL_TABLET | ORAL | Status: DC | PRN
Start: 2013-05-01 — End: 2014-01-23

## 2013-05-01 MED ORDER — SODIUM CHLORIDE 0.9 % IV BOLUS (SEPSIS)
1000.0000 mL | Freq: Once | INTRAVENOUS | Status: AC
  Administered 2013-05-01: 1000 mL via INTRAVENOUS

## 2013-05-01 MED ORDER — MORPHINE SULFATE 4 MG/ML IJ SOLN: 4.0000 mg | INTRAMUSCULAR | Status: DC | PRN

## 2013-05-01 MED ORDER — CIPROFLOXACIN HCL 500 MG PO TABS: 500.0000 mg | ORAL_TABLET | Freq: Two times a day (BID) | ORAL | Status: DC

## 2013-05-01 MED ORDER — ONDANSETRON HCL 4 MG/2ML IJ SOLN
4.0000 mg | Freq: Once | INTRAMUSCULAR | Status: AC
  Administered 2013-05-01: 4 mg via INTRAVENOUS
  Filled 2013-05-01: qty 2

## 2013-05-01 MED ORDER — ACETAMINOPHEN 325 MG PO TABS
650.0000 mg | ORAL_TABLET | Freq: Once | ORAL | Status: AC
  Administered 2013-05-01: 650 mg via ORAL
  Filled 2013-05-01: qty 2

## 2013-05-01 MED ORDER — DEXTROSE 5 % IV SOLN
2.0000 g | Freq: Once | INTRAVENOUS | Status: AC
  Administered 2013-05-01: 2 g via INTRAVENOUS
  Filled 2013-05-01: qty 2

## 2013-05-01 MED ORDER — DEXTROSE 5 % IV SOLN
INTRAVENOUS | Status: AC
  Filled 2013-05-01: qty 2

## 2013-05-01 NOTE — ED Provider Notes (Signed)
CSN: [id number]     Arrival date & time 05/01/13  1833 History  This chart was scribed for Tanna Furry, MD by Rolanda Lundborg, ED Scribe. This patient was seen in room APA11/APA11 and the patient's care was started at 9:31 PM.    Chief Complaint  Patient presents with  . Back Pain   The history is provided by the patient. No language interpreter was used.   HPI Comments: Jackie Smith is a 28 y.o. female with a h/o frequent UTIs who presents to the Emergency Department complaining of lower back pain with fever, chills, headache, and dysuria onset 12:30 today. She states she used to get frequent UTIs and kidney infections and states this feels similar but worse. She has not had tylenol or motrin. She states she has been SOB since 4pm today. She denies sore throat or sinus symptoms, cough, abdominal pain, nausea, vomiting, diarrhea, body aches. There is no chance of pregnancy. She denies vaginal bleeding or discharged. She had a uterine ablation in 2011 and no longer gets periods.    Past Medical History  Diagnosis Date  . Gastritis   . Acid reflux   . Frequent UTI   . Infections of kidney    Past Surgical History  Procedure Laterality Date  . Dilation and curettage of uterus    . Induced abortion    . Laser ablation of the cervix    . C sections     Family History  Problem Relation Age of Onset  . Heart disease    . Arthritis    . Cancer    . Asthma    . Diabetes     History  Substance Use Topics  . Smoking status: Former Smoker -- 1.00 packs/day  . Smokeless tobacco: Not on file  . Alcohol Use: No   OB History   Grav Para Term Preterm Abortions TAB SAB Ect Mult Living                 Review of Systems  Constitutional: Positive for fever and chills. Negative for diaphoresis, appetite change and fatigue.  HENT: Negative for mouth sores, sore throat and trouble swallowing.   Eyes: Negative for visual disturbance.  Respiratory: Positive for shortness of breath. Negative  for cough, chest tightness and wheezing.   Cardiovascular: Negative for chest pain.  Gastrointestinal: Negative for nausea, vomiting, abdominal pain, diarrhea and abdominal distention.  Endocrine: Negative for polydipsia, polyphagia and polyuria.  Genitourinary: Positive for dysuria. Negative for frequency and hematuria.  Musculoskeletal: Positive for back pain. Negative for gait problem.  Skin: Negative for color change, pallor and rash.  Neurological: Positive for headaches. Negative for dizziness, syncope and light-headedness.  Hematological: Does not bruise/bleed easily.  Psychiatric/Behavioral: Negative for behavioral problems and confusion.      Allergies  Imitrex  Home Medications   Prior to Admission medications   Medication Sig Start Date End Date Taking? Authorizing Provider  ALPRAZolam Duanne Moron) 1 MG tablet Take 1 mg by mouth at bedtime as needed for anxiety.    Historical Provider, MD  desvenlafaxine (PRISTIQ) 50 MG 24 hr tablet Take 50 mg by mouth daily.    Historical Provider, MD  ibuprofen (ADVIL,MOTRIN) 200 MG tablet Take 600 mg by mouth every 6 (six) hours as needed for fever.    Historical Provider, MD  zolpidem (AMBIEN) 10 MG tablet Take 10 mg by mouth at bedtime as needed for sleep.    Historical Provider, MD   BP 109/67  Pulse 113  Temp(Src) 101.2 F (38.4 C) (Oral)  Resp 18  Ht 5' 3.5" (1.613 m)  Wt 186 lb (84.369 kg)  BMI 32.43 kg/m2  SpO2 98% Physical Exam  Constitutional: She is oriented to person, place, and time. She appears well-developed and well-nourished. No distress.  HENT:  Head: Normocephalic.  Eyes: Conjunctivae are normal. Pupils are equal, round, and reactive to light. No scleral icterus.  Neck: Normal range of motion. Neck supple. No thyromegaly present.  Cardiovascular: Normal rate and regular rhythm.  Exam reveals no gallop and no friction rub.   No murmur heard. Pulmonary/Chest: Effort normal and breath sounds normal. No respiratory  distress. She has no wheezes. She has no rales.  Abdominal: Soft. Bowel sounds are normal. She exhibits no distension. There is no tenderness. There is no rebound.  Musculoskeletal: Normal range of motion.  Mid lumbar spinal tenderness.  Neurological: She is alert and oriented to person, place, and time.  Skin: Skin is warm and dry. No rash noted.  Psychiatric: She has a normal mood and affect. Her behavior is normal.    ED Course  Procedures (including critical care time) Medications  ondansetron (ZOFRAN) injection 4 mg (4 mg Intravenous Given 05/01/13 2303)  ketorolac (TORADOL) 30 MG/ML injection 30 mg (30 mg Intravenous Given 05/01/13 2305)  cefTRIAXone (ROCEPHIN) 2 g in dextrose 5 % 50 mL IVPB (0 g Intravenous Stopped 05/01/13 2336)  sodium chloride 0.9 % bolus 1,000 mL (0 mLs Intravenous Stopped 05/02/13 0043)  sodium chloride 0.9 % bolus 1,000 mL (0 mLs Intravenous Stopped 05/02/13 0043)  acetaminophen (TYLENOL) tablet 650 mg (650 mg Oral Given 05/01/13 2334)    DIAGNOSTIC STUDIES: Oxygen Saturation is 98% on RA, normal by my interpretation.    COORDINATION OF CARE: 10:03 PM- Discussed treatment plan with pt. Pt agrees to plan.    Labs Review Labs Reviewed  URINALYSIS, ROUTINE W REFLEX MICROSCOPIC - Abnormal; Notable for the following:    Nitrite POSITIVE (*)    Leukocytes, UA TRACE (*)    All other components within normal limits  URINE MICROSCOPIC-ADD ON - Abnormal; Notable for the following:    Squamous Epithelial / LPF MANY (*)    Bacteria, UA MANY (*)    All other components within normal limits  PREGNANCY, URINE    Imaging Review No results found.   EKG Interpretation None      MDM   Final diagnoses:  Pyelonephritis    Symptoms improved.  BP improves.  Lase check 119/68. DC with pain meds, Abx, zofran.  I personally performed the services described in this documentation, which was scribed in my presence. The recorded information has been reviewed and is  accurate.      Tanna Furry, MD 05/05/13 1556

## 2013-05-01 NOTE — ED Notes (Signed)
Pt c/o lower back pain, fever, vaginal odor, chills, and dysuria.

## 2013-05-01 NOTE — Discharge Instructions (Signed)
Pyelonephritis, Adult Pyelonephritis is a kidney infection. A kidney infection can happen quickly, or it can last for a long time. HOME CARE   Take your medicine (antibiotics) as told. Finish it even if you start to feel better.  Keep all doctor visits as told.  Drink enough fluids to keep your pee (urine) clear or pale yellow.  Only take medicine as told by your doctor. GET HELP RIGHT AWAY IF:   You have a fever or lasting symptoms for more than 2-3 days.  You have a fever and your symptoms suddenly get worse.  You cannot take your medicine or drink fluids as told.  You have chills and shaking.  You feel very weak or pass out (faint).  You do not feel better after 2 days. MAKE SURE YOU:  Understand these instructions.  Will watch your condition.  Will get help right away if you are not doing well or get worse. Document Released: 02/11/2004 Document Revised: 07/05/2011 Document Reviewed: 06/23/2010 [facility] Patient Information 2014 [place], [place].

## 2013-05-01 NOTE — ED Notes (Signed)
Family at bedside. Patient wanting to know if she is going to have IV started or if she can get pain medication for headache.

## 2013-05-02 NOTE — ED Notes (Signed)
Pt alert & oriented x4, stable gait. Patient given discharge instructions, paperwork & prescription(s). Patient  instructed to stop at the registration desk to finish any additional paperwork. Patient verbalized understanding. Pt left department w/ no further questions. 

## 2014-01-23 ENCOUNTER — Emergency Department (HOSPITAL_BASED_OUTPATIENT_CLINIC_OR_DEPARTMENT_OTHER)
Admission: EM | Admit: 2014-01-23 | Discharge: 2014-01-23 | Disposition: A | Discharge status: Home / Self Care | Payer: Managed Care, Other (non HMO) | Attending: Emergency Medicine | Admitting: Emergency Medicine

## 2014-01-23 ENCOUNTER — Encounter (HOSPITAL_BASED_OUTPATIENT_CLINIC_OR_DEPARTMENT_OTHER): Payer: Self-pay | Admitting: *Deleted

## 2014-01-23 DIAGNOSIS — J40 Bronchitis, not specified as acute or chronic: Secondary | ICD-10-CM

## 2014-01-23 DIAGNOSIS — Z87440 Personal history of urinary (tract) infections: Secondary | ICD-10-CM | POA: Diagnosis not present

## 2014-01-23 DIAGNOSIS — J0100 Acute maxillary sinusitis, unspecified: Secondary | ICD-10-CM

## 2014-01-23 DIAGNOSIS — Z79899 Other long term (current) drug therapy: Secondary | ICD-10-CM | POA: Diagnosis not present

## 2014-01-23 DIAGNOSIS — M2550 Pain in unspecified joint: Secondary | ICD-10-CM | POA: Diagnosis not present

## 2014-01-23 DIAGNOSIS — J209 Acute bronchitis, unspecified: Secondary | ICD-10-CM | POA: Insufficient documentation

## 2014-01-23 DIAGNOSIS — Z8719 Personal history of other diseases of the digestive system: Secondary | ICD-10-CM | POA: Insufficient documentation

## 2014-01-23 DIAGNOSIS — R05 Cough: Secondary | ICD-10-CM | POA: Diagnosis present

## 2014-01-23 DIAGNOSIS — Z87448 Personal history of other diseases of urinary system: Secondary | ICD-10-CM | POA: Insufficient documentation

## 2014-01-23 DIAGNOSIS — Z87891 Personal history of nicotine dependence: Secondary | ICD-10-CM | POA: Diagnosis not present

## 2014-01-23 MED ORDER — AZITHROMYCIN 250 MG PO TABS: 250.0000 mg | ORAL_TABLET | Freq: Every day | ORAL | Status: DC

## 2014-01-23 MED ORDER — BENZONATATE 100 MG PO CAPS: 100.0000 mg | ORAL_CAPSULE | Freq: Three times a day (TID) | ORAL | Status: DC

## 2014-01-23 NOTE — ED Notes (Signed)
Pt c/o URI symptoms x 4 days 

## 2014-01-23 NOTE — ED Provider Notes (Signed)
CSN: [id number]     Arrival date & time 01/23/14  1646 History   First MD Initiated Contact with Patient 01/23/14 1809     Chief Complaint  Patient presents with  . URI     (Consider location/radiation/quality/duration/timing/severity/associated sxs/prior Treatment) Patient is a 29 y.o. female presenting with URI. The history is provided by the patient. No language interpreter was used.  URI Presenting symptoms: congestion, cough and sore throat   Severity:  Moderate Onset quality:  Gradual Timing:  Constant Progression:  Worsening Chronicity:  New Relieved by:  Nothing Worsened by:  Nothing tried Ineffective treatments:  None tried Associated symptoms: arthralgias and sinus pain   Risk factors: no sick contacts     Past Medical History  Diagnosis Date  . Gastritis   . Acid reflux   . Frequent UTI   . Infections of kidney    Past Surgical History  Procedure Laterality Date  . Dilation and curettage of uterus    . Induced abortion    . Laser ablation of the cervix    . C sections     Family History  Problem Relation Age of Onset  . Heart disease    . Arthritis    . Cancer    . Asthma    . Diabetes     History  Substance Use Topics  . Smoking status: Former Smoker -- 1.00 packs/day  . Smokeless tobacco: Not on file  . Alcohol Use: No   OB History    No data available     Review of Systems  HENT: Positive for congestion and sore throat.   Respiratory: Positive for cough.   Musculoskeletal: Positive for arthralgias.  All other systems reviewed and are negative.     Allergies  Imitrex  Home Medications   Prior to Admission medications   Medication Sig Start Date End Date Taking? Authorizing Provider  azithromycin (ZITHROMAX) 250 MG tablet Take 1 tablet (250 mg total) by mouth daily. Take first 2 tablets together, then 1 every day until finished. 01/23/14   Fransico Meadow, PA-C  benzonatate (TESSALON) 100 MG capsule Take 1 capsule (100 mg total) by  mouth every 8 (eight) hours. 01/23/14   Fransico Meadow, PA-C  dicyclomine (BENTYL) 10 MG capsule Take 10 mg by mouth 4 (four) times daily. 02/20/13   Historical Provider, MD  ondansetron (ZOFRAN ODT) 4 MG disintegrating tablet Take 1 tablet (4 mg total) by mouth every 8 (eight) hours as needed for nausea. 05/01/13   Tanna Furry, MD   BP 120/76 mmHg  Pulse 76  Temp(Src) 98.9 F (37.2 C)  Resp 20  Ht 5\' 4"  (1.626 m)  Wt 224 lb (101.606 kg)  BMI 38.43 kg/m2  SpO2 100% Physical Exam  Constitutional: She appears well-developed and well-nourished.  HENT:  Head: Normocephalic.  Right Ear: External ear normal.  Left Ear: External ear normal.  Eyes: Conjunctivae are normal. Pupils are equal, round, and reactive to light.  Neck: Normal range of motion. Neck supple.  Cardiovascular: Normal rate and normal heart sounds.   Pulmonary/Chest: Effort normal and breath sounds normal.  Abdominal: Soft.  Musculoskeletal: Normal range of motion.  Neurological: She is alert.  Skin: Skin is warm.  Psychiatric: She has a normal mood and affect.  Nursing note and vitals reviewed.   ED Course  Procedures (including critical care time) Labs Review Labs Reviewed - No data to display  Imaging Review No results found.   EKG Interpretation None  MDM  Pt thinks she may have a sinus infection   Final diagnoses:  Bronchitis  Acute maxillary sinusitis, recurrence not specified    zithromax Tessalon.    Hollace Kinnier [place], PA-C 01/23/14 [place], MD 01/23/14 [phone]

## 2014-01-23 NOTE — Discharge Instructions (Signed)

## 2014-03-17 ENCOUNTER — Other Ambulatory Visit: Payer: Self-pay | Admitting: Family Medicine

## 2014-03-17 DIAGNOSIS — N6001 Solitary cyst of right breast: Secondary | ICD-10-CM

## 2014-03-27 ENCOUNTER — Ambulatory Visit
Admission: RE | Admit: 2014-03-27 | Discharge: 2014-03-27 | Disposition: A | Discharge status: Home / Self Care | Payer: Managed Care, Other (non HMO) | Source: Ambulatory Visit | Attending: Family Medicine | Admitting: Family Medicine

## 2014-03-27 DIAGNOSIS — N6001 Solitary cyst of right breast: Secondary | ICD-10-CM

## 2014-05-15 ENCOUNTER — Ambulatory Visit (INDEPENDENT_AMBULATORY_CARE_PROVIDER_SITE_OTHER): Payer: Managed Care, Other (non HMO) | Admitting: Psychiatry

## 2014-05-15 ENCOUNTER — Telehealth (HOSPITAL_COMMUNITY): Payer: Self-pay | Admitting: *Deleted

## 2014-05-15 ENCOUNTER — Encounter (HOSPITAL_COMMUNITY): Payer: Self-pay | Admitting: Psychiatry

## 2014-05-15 VITALS — BP 93/68 | HR 80 | Ht 64.0 in | Wt 206.6 lb

## 2014-05-15 DIAGNOSIS — F411 Generalized anxiety disorder: Secondary | ICD-10-CM | POA: Diagnosis not present

## 2014-05-15 DIAGNOSIS — F322 Major depressive disorder, single episode, severe without psychotic features: Secondary | ICD-10-CM

## 2014-05-15 DIAGNOSIS — F329 Major depressive disorder, single episode, unspecified: Secondary | ICD-10-CM | POA: Insufficient documentation

## 2014-05-15 MED ORDER — DESVENLAFAXINE SUCCINATE ER 100 MG PO TB24: 100.0000 mg | ORAL_TABLET | Freq: Every day | ORAL | Status: DC

## 2014-05-15 MED ORDER — ZOLPIDEM TARTRATE ER 12.5 MG PO TBCR: 12.5000 mg | EXTENDED_RELEASE_TABLET | Freq: Every evening | ORAL | Status: AC | PRN

## 2014-05-15 MED ORDER — ALPRAZOLAM 1 MG PO TABS: 1.0000 mg | ORAL_TABLET | Freq: Three times a day (TID) | ORAL | Status: DC
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
## 2014-06-09 ENCOUNTER — Ambulatory Visit (INDEPENDENT_AMBULATORY_CARE_PROVIDER_SITE_OTHER): Payer: Managed Care, Other (non HMO) | Admitting: Psychiatry

## 2014-06-09 ENCOUNTER — Encounter (HOSPITAL_COMMUNITY): Payer: Self-pay | Admitting: Psychiatry

## 2014-06-09 DIAGNOSIS — F322 Major depressive disorder, single episode, severe without psychotic features: Secondary | ICD-10-CM
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
## 2014-06-09 NOTE — Patient Instructions (Signed)
Discussed orally 

## 2014-06-12 ENCOUNTER — Telehealth (HOSPITAL_COMMUNITY): Payer: Self-pay | Admitting: *Deleted

## 2014-06-12 ENCOUNTER — Ambulatory Visit (HOSPITAL_COMMUNITY): Payer: Managed Care, Other (non HMO) | Admitting: Psychiatry

## 2014-06-12 NOTE — Telephone Encounter (Signed)
Dr. Harrington Challenger, patient came in with an attitude, her tone of voice.    When asked her for her copay she said she did not pay the first time she came and wondered why she had a copay.  When I told her that the first time she came there were issues that may have prevented her from paying.   She insisted on what the issues were.   She became rude and said she would deal with it.

## 2014-06-12 NOTE — Telephone Encounter (Signed)
This is the 3rd time pt has been excessively rude to staff. Therefore she will be dismissed from the clinic. Letter written

## 2014-06-17 ENCOUNTER — Telehealth (HOSPITAL_COMMUNITY): Payer: Self-pay | Admitting: *Deleted

## 2014-06-17 NOTE — Telephone Encounter (Addendum)
Pt came into office requesting to speak with Dr. Harrington Challenger RMA. Pt was pulled aside to make sure she did not cause any attention in the waiting room. Per pt she was not being rude, per pt she was trying to figure out why she did not get charged the first appt and all of a sudden gets charged. Informed pt that sometimes our computers say something else and per pt she did not want to be over charge. Per pt when she asked receptionist at the front window, she did not answer her she just told her that she was being rude. Informed pt that in times that would make her feel upset to just walk away and gather her self back. Pt agreed. Pt then stated that she do not know what the front desk lazy said to Dr. Harrington Challenger but then that's when Dr. Harrington Challenger came out to dismissed her and she did not feel like she was not being rude. Per pt she was upset because what the front desk lady told her. Pt then stated that she would like to get her medical records and would only like to deal with Dr. Harrington Challenger RMA. Informed pt that permission needed to come from manager first. Per pt she did not want to deal with front desk lady. Camera operator and informed him that when pt came in [facility] took her back to not cause attention and spoke with her. Per manager, it's ok but to not speak with her after that and if pt needed anything else to have he directly call her on his cell phone due to pt stating that she have already gotten an attorney. Informed pt of what manger stated and she stated that she did not say that. Per pt she stated that if manager did not take care of the situation then she would to get an attorney because she was embarrassed due to other pts being out in the lobby hearing this. Give pt manager's cell phone number and she stating that she will call him. Beatriz Stallion Orthoptist) then called stating that pt will be coming to office to get her records and to print it out and release it to her. Informed Manager that provider will have to sign a provider  release of information form. Per manager to release records to pt. Had Dr. Harrington Challenger to sign a release anyway and give pt her records and pt agreed and took records and left office at 5:00pm

## 2014-06-17 NOTE — Telephone Encounter (Signed)
Pt came into office requesting to have records release to her and filled out release of information. Per Beatriz Stallion, to release pt record to her without provider request. Per Beatriz Stallion to that if pt is requesting her records that office is required to give it to her. Records was printed and given to pt at 5:00pm. Dr.Ross signed release of records for pt to get her records. Was instructed to only release Dr. Harrington Challenger records.

## 2014-06-18 ENCOUNTER — Telehealth (HOSPITAL_COMMUNITY): Payer: Self-pay | Admitting: *Deleted

## 2014-06-18 NOTE — Telephone Encounter (Signed)
Called [employer] pt insurer and [employer] on [place] and number was provided. This call was in reference to previous call from [employer] request a not from Dr. Harrington Challenger to see if she Extended pt Short Term Disability and to get last record on 06-17-14.

## 2014-06-19 ENCOUNTER — Telehealth (HOSPITAL_COMMUNITY): Payer: Self-pay | Admitting: *Deleted

## 2014-06-19 ENCOUNTER — Encounter (HOSPITAL_COMMUNITY): Payer: Self-pay | Admitting: Psychiatry

## 2014-06-19 NOTE — Telephone Encounter (Signed)
Called pt insurance company 06-18-14 due to there previous call needing to have a note of short term disability stating that pt leave was extended. Informed them to call pt and she agreed. Called Ardelle Lesches and he instructed to send records to [employer] as long as there was a release for them. Called pt insurer from [place]) and lmtcb and number was provided. Cyril Mourning called back and got a bit more information from her. Per Cyril Mourning, pt is on short term disability and would like to see if Maurice Small was the one that's keeping her out of work and if so she would like for her to see if she could write an extension letter for pt due to her case will be closed by May 29. Inform Cyril Mourning that the form that was faxed to them 05-19-14 had Dr. Harrington Challenger on the form. Per Cyril Mourning, she received that one and did not realized that Maurice Small and Dr. Harrington Challenger was in the same office. Per Cyril Mourning, she called Dr. Harrington Challenger office 06-18-14 and was instructed to call office but now she knows that they are in the same office. Kristen later stated that she would like either a letter from Dr. Harrington Challenger or Maurice Small, whoever that took her out of work, to write a letter of an extension for pt or her case her case will be closed 06-15-14. Per Cyril Mourning, they received the first letter that stated that she was going to be reevaluated so that's why she was calling. [place] to get advise in what to do. Per Sunday Spillers to explain the situation to Dr. Harrington Challenger and see if she would write the letter to pt insurance company because she was the one that stated for pt to be token out of work until re-evaluation. Informed Dr. Harrington Challenger and she wrote letter which is in patient's chart and stated to ask Ardelle Lesches to see if that was good enough before I sent the letter out. Called Shawn which is the clinical supervisor and left message for either him or Ardelle Lesches to call [place] office back. Shawn called and stated that it should be ok and if he do not  call office back by 1:00pm 06-19-14 then it's ok to send Dr. Harrington Challenger letter to pt insurer. Faxed pt letter and form that Dr. Harrington Challenger had previously filled out to make sure that every information was received for pt.

## 2014-07-02 ENCOUNTER — Ambulatory Visit (HOSPITAL_COMMUNITY): Payer: Self-pay | Admitting: Psychiatry

## 2014-07-16 ENCOUNTER — Ambulatory Visit (HOSPITAL_COMMUNITY): Payer: Self-pay | Admitting: Psychiatry

## 2014-10-08 ENCOUNTER — Telehealth (HOSPITAL_COMMUNITY): Payer: Self-pay | Admitting: *Deleted

## 2014-10-10 ENCOUNTER — Encounter (HOSPITAL_COMMUNITY): Payer: Self-pay | Admitting: *Deleted

## 2014-10-10 NOTE — Progress Notes (Signed)
Manually Faxed printed records from [facility] that pt request. On release of information release of information form, pt wanted records from this office with dates of service release from 05-18-14 to present to be sent to [facility].Marland Kitchen Faxed records the first time and did not come back with a failure sheet. Will be waiting for fax machine to generate a confirmation sheet to make sure it went through.

## 2014-10-10 NOTE — Progress Notes (Signed)
Manually Faxed printed records from [facility] that pt request. On release of information release of information form, pt wanted records from this office with dates of service release from 05-18-14 to present to be sent to Dr. Everette Rank. Faxed records the first time but a Tx Failure Notice came back. Office re faxed it again and the notice did not come back. Will be waiting for fax machine to generate a confirmation sheet to make sure it went through.

## 2014-10-13 ENCOUNTER — Telehealth (HOSPITAL_COMMUNITY): Payer: Self-pay | Admitting: *Deleted

## 2014-10-13 NOTE — Telephone Encounter (Signed)
Called pt to inform her that her records that she requested is ready for pick up. lmtcb number provided

## 2014-11-05 ENCOUNTER — Encounter: Payer: Self-pay | Admitting: Adult Health

## 2014-12-12 ENCOUNTER — Encounter (HOSPITAL_BASED_OUTPATIENT_CLINIC_OR_DEPARTMENT_OTHER): Payer: Self-pay | Admitting: *Deleted

## 2014-12-12 ENCOUNTER — Emergency Department (HOSPITAL_BASED_OUTPATIENT_CLINIC_OR_DEPARTMENT_OTHER): Payer: Managed Care, Other (non HMO)

## 2014-12-12 ENCOUNTER — Emergency Department (HOSPITAL_BASED_OUTPATIENT_CLINIC_OR_DEPARTMENT_OTHER)
Admission: EM | Admit: 2014-12-12 | Discharge: 2014-12-12 | Disposition: A | Discharge status: Home / Self Care | Payer: Managed Care, Other (non HMO) | Attending: Emergency Medicine | Admitting: Emergency Medicine

## 2014-12-12 DIAGNOSIS — K219 Gastro-esophageal reflux disease without esophagitis: Secondary | ICD-10-CM | POA: Diagnosis not present

## 2014-12-12 DIAGNOSIS — Z3202 Encounter for pregnancy test, result negative: Secondary | ICD-10-CM | POA: Diagnosis not present

## 2014-12-12 DIAGNOSIS — Z87448 Personal history of other diseases of urinary system: Secondary | ICD-10-CM | POA: Insufficient documentation

## 2014-12-12 DIAGNOSIS — E079 Disorder of thyroid, unspecified: Secondary | ICD-10-CM | POA: Insufficient documentation

## 2014-12-12 DIAGNOSIS — Z87440 Personal history of urinary (tract) infections: Secondary | ICD-10-CM | POA: Diagnosis not present

## 2014-12-12 DIAGNOSIS — J069 Acute upper respiratory infection, unspecified: Secondary | ICD-10-CM

## 2014-12-12 DIAGNOSIS — R05 Cough: Secondary | ICD-10-CM | POA: Diagnosis present

## 2014-12-12 DIAGNOSIS — Z87891 Personal history of nicotine dependence: Secondary | ICD-10-CM | POA: Diagnosis not present

## 2014-12-12 DIAGNOSIS — K589 Irritable bowel syndrome without diarrhea: Secondary | ICD-10-CM | POA: Diagnosis not present

## 2014-12-12 DIAGNOSIS — Z86018 Personal history of other benign neoplasm: Secondary | ICD-10-CM | POA: Diagnosis not present

## 2014-12-12 DIAGNOSIS — Z79899 Other long term (current) drug therapy: Secondary | ICD-10-CM | POA: Diagnosis not present

## 2014-12-12 DIAGNOSIS — M546 Pain in thoracic spine: Secondary | ICD-10-CM | POA: Diagnosis not present

## 2014-12-12 LAB — PREGNANCY, URINE: Preg Test, Ur: NEGATIVE

## 2014-12-12 MED ORDER — BENZONATATE 100 MG PO CAPS: 100.0000 mg | ORAL_CAPSULE | Freq: Three times a day (TID) | ORAL | Status: DC

## 2014-12-12 NOTE — ED Provider Notes (Signed)
CSN: [id number]     Arrival date & time 12/12/14  1020 History   First MD Initiated Contact with Patient 12/12/14 1043     Chief Complaint  Patient presents with  . Cough     (Consider location/radiation/quality/duration/timing/severity/associated sxs/prior Treatment) Patient is a 29 y.o. female presenting with cough. The history is provided by the patient.  Cough Cough characteristics:  Productive Sputum characteristics:  Nondescript Severity:  Moderate Onset quality:  Gradual Duration:  1 week Timing:  Constant Progression:  Worsening Chronicity:  New Smoker: yes   Relieved by:  Nothing Worsened by:  Nothing tried Ineffective treatments:  None tried Associated symptoms: shortness of breath   Associated symptoms: no chest pain, no chills, no fever, no headaches, no myalgias, no rhinorrhea and no wheezing     29 yo F with a chief complaint of a cough. This been going on for about a week. Denies fevers or chills. Feels subjectively short of breath. Patient also having some left upper back pain that is worse with coughing or moving. Patient denies injury. Patient unsure of sick contacts.  Past Medical History  Diagnosis Date  . Gastritis   . Acid reflux   . Frequent UTI   . Infections of kidney   . Thyroid disease   . Irritable bowel syndrome (IBS)   . History of benign breast tumor    Past Surgical History  Procedure Laterality Date  . Dilation and curettage of uterus    . Induced abortion    . Laser ablation of the cervix    . C sections    . Tubal ligation     Family History  Problem Relation Age of Onset  . Heart disease    . Arthritis    . Cancer    . Asthma    . Diabetes    . Drug abuse Mother   . Bipolar disorder Father   . Drug abuse Father   . Depression Sister   . Anxiety disorder Sister   . Bipolar disorder Paternal Uncle    Social History  Substance Use Topics  . Smoking status: Former Smoker -- 0.25 packs/day  . Smokeless tobacco: Never  Used  . Alcohol Use: No   OB History    No data available     Review of Systems  Constitutional: Negative for fever and chills.  HENT: Negative for congestion and rhinorrhea.   Eyes: Negative for redness and visual disturbance.  Respiratory: Positive for cough and shortness of breath. Negative for wheezing.   Cardiovascular: Negative for chest pain and palpitations.  Gastrointestinal: Negative for nausea and vomiting.  Genitourinary: Negative for dysuria and urgency.  Musculoskeletal: Negative for myalgias and arthralgias.  Skin: Negative for pallor and wound.  Neurological: Negative for dizziness and headaches.      Allergies  Imitrex  Home Medications   Prior to Admission medications   Medication Sig Start Date End Date Taking? Authorizing Provider  ALPRAZolam Duanne Moron) 1 MG tablet Take 1 tablet (1 mg total) by mouth 3 (three) times daily. 05/15/14  Yes Cloria Spring, MD  sertraline (ZOLOFT) 25 MG tablet Take 25 mg by mouth daily.   Yes Historical Provider, MD  SYNTHROID 25 MCG tablet Take 25 mcg by mouth daily before breakfast.  04/15/14  Yes Historical Provider, MD  zolpidem (AMBIEN CR) 12.5 MG CR tablet Take 1 tablet (12.5 mg total) by mouth at bedtime as needed for sleep. 05/15/14 05/15/15 Yes Cloria Spring, MD  benzonatate [facility])  100 MG capsule Take 1 capsule (100 mg total) by mouth every 8 (eight) hours. 12/12/14   Deno Etienne, DO  desvenlafaxine (PRISTIQ) 100 MG 24 hr tablet Take 1 tablet (100 mg total) by mouth daily. 05/15/14   Cloria Spring, MD  dicyclomine (BENTYL) 10 MG capsule Take 10 mg by mouth 4 (four) times daily. 02/20/13   Historical Provider, MD  pantoprazole (PROTONIX) 20 MG tablet Take 20 mg by mouth as needed.    Historical Provider, MD   BP 101/62 mmHg  Pulse 62  Temp(Src) 98.1 F (36.7 C) (Oral)  Resp 18  Ht 5\' 4"  (1.626 m)  Wt 200 lb (90.719 kg)  BMI 34.31 kg/m2  SpO2 100% Physical Exam  Constitutional: She is oriented to person, place, and  time. She appears well-developed and well-nourished. No distress.  HENT:  Head: Normocephalic and atraumatic.  Swollen turbinates, postnasal drip, mild posterior oropharyngeal erythema without swelling or tonsillar exudates.  Eyes: EOM are normal. Pupils are equal, round, and reactive to light.  Neck: Normal range of motion. Neck supple.  Cardiovascular: Normal rate and regular rhythm.  Exam reveals no gallop and no friction rub.   No murmur heard. Pulmonary/Chest: Effort normal. She has no wheezes. She has no rales.  Abdominal: Soft. She exhibits no distension. There is no tenderness. There is no rebound and no guarding.  Musculoskeletal: She exhibits tenderness. She exhibits no edema.  Patient is point tender in between her left scapula and spine.  Neurological: She is alert and oriented to person, place, and time.  Skin: Skin is warm and dry. She is not diaphoretic.  Psychiatric: She has a normal mood and affect. Her behavior is normal.  Nursing note and vitals reviewed.   ED Course  Procedures (including critical care time) Labs Review Labs Reviewed  PREGNANCY, URINE    Imaging Review Dg Chest 2 View  12/12/2014  CLINICAL DATA:  Shortness of breath. EXAM: CHEST  2 VIEW COMPARISON:  June 28, 2014. FINDINGS: The heart size and mediastinal contours are within normal limits. Both lungs are clear. No pneumothorax or pleural effusion is noted. The visualized skeletal structures are unremarkable. IMPRESSION: No active cardiopulmonary disease. Electronically Signed   By: Marijo Conception, M.D.   On: 12/12/2014 11:02   I have personally reviewed and evaluated these images and lab results as part of my medical decision-making.   EKG Interpretation None      MDM   Final diagnoses:  URI (upper respiratory infection)    29 yo F with a chief complaint of cough congestion. History and physical consistent with upper respiratory infection. Pain to the back focally tender likely muscular  skeletal secondary to coughing. We'll start the patient on Tessalon Perles. Have her take Tylenol and Motrin as needed for pain.   I have discussed the diagnosis/risks/treatment options with the patient and believe the pt to be eligible for discharge home to follow-up with PCP. We also discussed returning to the ED immediately if new or worsening sx occur. We discussed the sx which are most concerning (e.g., sudden worsening pain, fever, inability to tolerate by mouth) that necessitate immediate return. Medications administered to the patient during their visit and any new prescriptions provided to the patient are listed below.  Medications given during this visit Medications - No data to display  Discharge Medication List as of 12/12/2014 11:53 AM    START taking these medications   Details  benzonatate (TESSALON) 100 MG capsule Take 1 capsule (  100 mg total) by mouth every 8 (eight) hours., Starting 12/12/2014, Until Discontinued, Print        The patient appears reasonably screen and/or stabilized for discharge and I doubt any other medical condition or other [facility] requiring further screening, evaluation, or treatment in the ED at this time prior to discharge.      Deno Etienne, DO 12/12/14 1451

## 2014-12-12 NOTE — Discharge Instructions (Signed)
Take 4 over the counter ibuprofen tablets 3 times a day or 2 over-the-counter naproxen tablets twice a day for pain.  Upper Respiratory Infection, Adult Most upper respiratory infections (URIs) are a viral infection of the air passages leading to the lungs. A URI affects the nose, throat, and upper air passages. The most common type of URI is nasopharyngitis and is typically referred to as "the common cold." URIs run their course and usually go away on their own. Most of the time, a URI does not require medical attention, but sometimes a bacterial infection in the upper airways can follow a viral infection. This is called a secondary infection. Sinus and middle ear infections are common types of secondary upper respiratory infections. Bacterial pneumonia can also complicate a URI. A URI can worsen asthma and chronic obstructive pulmonary disease (COPD). Sometimes, these complications can require emergency medical care and may be life threatening.  CAUSES Almost all URIs are caused by viruses. A virus is a type of germ and can spread from one person to another.  RISKS FACTORS You may be at risk for a URI if:   You smoke.   You have chronic heart or lung disease.  You have a weakened defense (immune) system.   You are very young or very old.   You have nasal allergies or asthma.  You work in crowded or poorly ventilated areas.  You work in health care facilities or schools. SIGNS AND SYMPTOMS  Symptoms typically develop 2-3 days after you come in contact with a cold virus. Most viral URIs last 7-10 days. However, viral URIs from the influenza virus (flu virus) can last 14-18 days and are typically more severe. Symptoms may include:   Runny or stuffy (congested) nose.   Sneezing.   Cough.   Sore throat.   Headache.   Fatigue.   Fever.   Loss of appetite.   Pain in your forehead, behind your eyes, and over your cheekbones (sinus pain).  Muscle aches.  DIAGNOSIS    Your health care provider may diagnose a URI by:  Physical exam.  Tests to check that your symptoms are not due to another condition such as:  Strep throat.  Sinusitis.  Pneumonia.  Asthma. TREATMENT  A URI goes away on its own with time. It cannot be cured with medicines, but medicines may be prescribed or recommended to relieve symptoms. Medicines may help:  Reduce your fever.  Reduce your cough.  Relieve nasal congestion. HOME CARE INSTRUCTIONS   Take medicines only as directed by your health care provider.   Gargle warm saltwater or take cough drops to comfort your throat as directed by your health care provider.  Use a warm mist humidifier or inhale steam from a shower to increase air moisture. This may make it easier to breathe.  Drink enough fluid to keep your urine clear or pale yellow.   Eat soups and other clear broths and maintain good nutrition.   Rest as needed.   Return to work when your temperature has returned to normal or as your health care provider advises. You may need to stay home longer to avoid infecting others. You can also use a face mask and careful hand washing to prevent spread of the virus.  Increase the usage of your inhaler if you have asthma.   Do not use any tobacco products, including cigarettes, chewing tobacco, or electronic cigarettes. If you need help quitting, ask your health care provider. PREVENTION  The best way  to protect yourself from getting a cold is to practice good hygiene.   Avoid oral or hand contact with people with cold symptoms.   Wash your hands often if contact occurs.  There is no clear evidence that vitamin C, vitamin E, echinacea, or exercise reduces the chance of developing a cold. However, it is always recommended to get plenty of rest, exercise, and practice good nutrition.  SEEK MEDICAL CARE IF:   You are getting worse rather than better.   Your symptoms are not controlled by medicine.   You  have chills.  You have worsening shortness of breath.  You have brown or red mucus.  You have yellow or brown nasal discharge.  You have pain in your face, especially when you bend forward.  You have a fever.  You have swollen neck glands.  You have pain while swallowing.  You have white areas in the back of your throat. SEEK IMMEDIATE MEDICAL CARE IF:   You have severe or persistent:  Headache.  Ear pain.  Sinus pain.  Chest pain.  You have chronic lung disease and any of the following:  Wheezing.  Prolonged cough.  Coughing up blood.  A change in your usual mucus.  You have a stiff neck.  You have changes in your:  Vision.  Hearing.  Thinking.  Mood. MAKE SURE YOU:   Understand these instructions.  Will watch your condition.  Will get help right away if you are not doing well or get worse.   This information is not intended to replace advice given to you by your health care provider. Make sure you discuss any questions you have with your health care provider.   Document Released: 06/29/2000 Document Revised: 05/20/2014 Document Reviewed: 04/10/2013 Elsevier Interactive Patient Education [employer].

## 2014-12-12 NOTE — ED Notes (Signed)
Pt reports cough and congestion x 1 week- Today reports she feels SOB and has pain in her back- Pt speaking full sentences without difficulty

## 2015-07-09 ENCOUNTER — Ambulatory Visit (HOSPITAL_COMMUNITY)
Admission: EM | Admit: 2015-07-09 | Discharge: 2015-07-09 | Disposition: A | Discharge status: Home / Self Care | Payer: Managed Care, Other (non HMO) | Attending: Family Medicine | Admitting: Family Medicine

## 2015-07-09 ENCOUNTER — Encounter (HOSPITAL_COMMUNITY): Payer: Self-pay | Admitting: Emergency Medicine

## 2015-07-09 DIAGNOSIS — J9801 Acute bronchospasm: Secondary | ICD-10-CM | POA: Diagnosis not present

## 2015-07-09 DIAGNOSIS — R0789 Other chest pain: Secondary | ICD-10-CM

## 2015-07-09 MED ORDER — IPRATROPIUM-ALBUTEROL 0.5-2.5 (3) MG/3ML IN SOLN
RESPIRATORY_TRACT | Status: AC
  Filled 2015-07-09: qty 3

## 2015-07-09 MED ORDER — ALBUTEROL SULFATE (2.5 MG/3ML) 0.083% IN NEBU
INHALATION_SOLUTION | RESPIRATORY_TRACT | Status: AC
  Filled 2015-07-09: qty 3

## 2015-07-09 MED ORDER — TRIAMCINOLONE ACETONIDE 40 MG/ML IJ SUSP
INTRAMUSCULAR | Status: AC
  Filled 2015-07-09: qty 2

## 2015-07-09 MED ORDER — IPRATROPIUM-ALBUTEROL 0.5-2.5 (3) MG/3ML IN SOLN
3.0000 mL | Freq: Once | RESPIRATORY_TRACT | Status: AC
Start: 2015-07-09 — End: 2015-07-09
  Administered 2015-07-09: 3 mL via RESPIRATORY_TRACT

## 2015-07-09 MED ORDER — PREDNISONE 20 MG PO TABS: ORAL_TABLET | ORAL | Status: DC

## 2015-07-09 MED ORDER — ALBUTEROL SULFATE HFA 108 (90 BASE) MCG/ACT IN AERS: 2.0000 | INHALATION_SPRAY | RESPIRATORY_TRACT | Status: DC | PRN

## 2015-07-09 MED ORDER — ALBUTEROL SULFATE (2.5 MG/3ML) 0.083% IN NEBU
2.5000 mg | INHALATION_SOLUTION | Freq: Once | RESPIRATORY_TRACT | Status: AC
  Administered 2015-07-09: 2.5 mg via RESPIRATORY_TRACT

## 2015-07-09 MED ORDER — TRIAMCINOLONE ACETONIDE 40 MG/ML IJ SUSP
60.0000 mg | Freq: Once | INTRAMUSCULAR | Status: AC
  Administered 2015-07-09: 60 mg via INTRAMUSCULAR

## 2015-07-09 MED ORDER — ALBUTEROL SULFATE HFA 108 (90 BASE) MCG/ACT IN AERS: 2.0000 | INHALATION_SPRAY | Freq: Once | RESPIRATORY_TRACT | Status: DC

## 2015-07-09 NOTE — ED Provider Notes (Signed)
CSN: [id number]     Arrival date & time 07/09/15  1828 History   First MD Initiated Contact with Patient 07/09/15 1856     Chief Complaint  Patient presents with  . URI   (Consider location/radiation/quality/duration/timing/severity/associated sxs/prior Treatment) HPI Comments: 30 year old female complaining of cough and wheezing and shortness of breath for the past week. Associated with DOE. Denies asthma or smoking. She is a former smoker. She said she had a slight fever earlier today but not on arrival to the urgent care. She is fully awake and alert showing no signs of acute distress.   Past Medical History  Diagnosis Date  . Gastritis   . Acid reflux   . Frequent UTI   . Infections of kidney   . Thyroid disease   . Irritable bowel syndrome (IBS)   . History of benign breast tumor    Past Surgical History  Procedure Laterality Date  . Dilation and curettage of uterus    . Induced abortion    . Laser ablation of the cervix    . C sections    . Tubal ligation     Family History  Problem Relation Age of Onset  . Heart disease    . Arthritis    . Cancer    . Asthma    . Diabetes    . Drug abuse Mother   . Bipolar disorder Father   . Drug abuse Father   . Depression Sister   . Anxiety disorder Sister   . Bipolar disorder Paternal Uncle    Social History  Substance Use Topics  . Smoking status: Former Smoker -- 0.25 packs/day  . Smokeless tobacco: Never Used  . Alcohol Use: No   OB History    No data available     Review of Systems  Constitutional: Positive for activity change. Negative for fatigue.  HENT: Negative for congestion, postnasal drip, sinus pressure and sore throat.   Respiratory: Positive for cough, shortness of breath and wheezing.   Cardiovascular: Positive for chest pain.  Gastrointestinal: Negative.   Musculoskeletal: Negative.   Neurological: Negative.   Psychiatric/Behavioral: Negative.   All other systems reviewed and are  negative.   Allergies  Imitrex  Home Medications   Prior to Admission medications   Medication Sig Start Date End Date Taking? Authorizing Provider  sertraline (ZOLOFT) 25 MG tablet Take 25 mg by mouth daily.   Yes Historical Provider, MD  SYNTHROID 25 MCG tablet Take 25 mcg by mouth daily before breakfast.  04/15/14  Yes Historical Provider, MD  albuterol (PROVENTIL HFA;VENTOLIN HFA) 108 (90 Base) MCG/ACT inhaler Inhale 2 puffs into the lungs every 4 (four) hours as needed for wheezing or shortness of breath. 07/09/15   Janne Napoleon, NP  ALPRAZolam Duanne Moron) 1 MG tablet Take 1 tablet (1 mg total) by mouth 3 (three) times daily. 05/15/14   Cloria Spring, MD  benzonatate (TESSALON) 100 MG capsule Take 1 capsule (100 mg total) by mouth every 8 (eight) hours. 12/12/14   Deno Etienne, DO  desvenlafaxine (PRISTIQ) 100 MG 24 hr tablet Take 1 tablet (100 mg total) by mouth daily. 05/15/14   Cloria Spring, MD  dicyclomine (BENTYL) 10 MG capsule Take 10 mg by mouth 4 (four) times daily. 02/20/13   Historical Provider, MD  pantoprazole (PROTONIX) 20 MG tablet Take 20 mg by mouth as needed.    Historical Provider, MD  predniSONE (DELTASONE) 20 MG tablet Take 3 tabs po on first day, 2 tabs  second day, 2 tabs third day, 1 tab fourth day, 1 tab 5th day. Take with food. 07/09/15   Janne Napoleon, NP   Meds Ordered and Administered this Visit   Medications  ipratropium-albuterol (DUONEB) 0.5-2.5 (3) MG/3ML nebulizer solution 3 mL (3 mLs Nebulization Given 07/09/15 1950)  triamcinolone acetonide (KENALOG-40) injection 60 mg (60 mg Intramuscular Given 07/09/15 1944)  albuterol (PROVENTIL) (2.5 MG/3ML) 0.083% nebulizer solution 2.5 mg (2.5 mg Nebulization Given 07/09/15 1950)    BP 117/80 mmHg  Pulse 75  Temp(Src) 98.6 F (37 C) (Oral)  Resp 22  SpO2 98% No data found.   Physical Exam  Constitutional: She is oriented to person, place, and time. She appears well-developed and well-nourished. No distress.  HENT:   Mouth/Throat: No oropharyngeal exudate.  Bilateral TMs are normal. Oropharynx with minor erythema otherwise normal.  Eyes: Conjunctivae and EOM are normal.  Neck: Normal range of motion. Neck supple.  Cardiovascular: Normal rate, regular rhythm, normal heart sounds and intact distal pulses.   Pulmonary/Chest: Effort normal. She has no rales.  Expiratory wheezes bilaterally. Prolonged expiratory phase  Musculoskeletal: Normal range of motion. She exhibits no edema.  Lymphadenopathy:    She has no cervical adenopathy.  Neurological: She is alert and oriented to person, place, and time. She exhibits normal muscle tone.  Skin: Skin is warm.  Nursing note and vitals reviewed.   ED Course  Procedures (including critical care time)  Labs Review Labs Reviewed - No data to display  Imaging Review No results found.   Visual Acuity Review  Right Eye Distance:   Left Eye Distance:   Bilateral Distance:    Right Eye Near:   Left Eye Near:    Bilateral Near:         MDM   1. Cough due to bronchospasm   2. Chest wall pain    Meds ordered this encounter  Medications  . DISCONTD: albuterol (PROVENTIL HFA;VENTOLIN HFA) 108 (90 Base) MCG/ACT inhaler 2 puff    Sig:   . ipratropium-albuterol (DUONEB) 0.5-2.5 (3) MG/3ML nebulizer solution 3 mL    Sig:   . triamcinolone acetonide (KENALOG-40) injection 60 mg    Sig:   . albuterol (PROVENTIL) (2.5 MG/3ML) 0.083% nebulizer solution 2.5 mg    Sig:   . albuterol (PROVENTIL HFA;VENTOLIN HFA) 108 (90 Base) MCG/ACT inhaler    Sig: Inhale 2 puffs into the lungs every 4 (four) hours as needed for wheezing or shortness of breath.    Dispense:  1 Inhaler    Refill:  0    Order Specific Question:  Supervising Provider    Answer:  Ihor Gully D [id number]  . predniSONE (DELTASONE) 20 MG tablet    Sig: Take 3 tabs po on first day, 2 tabs second day, 2 tabs third day, 1 tab fourth day, 1 tab 5th day. Take with food.    Dispense:  9 tablet     Refill:  0    Order Specific Question:  Supervising Provider    Answer:  Billy Fischer [phone]   Patient breathing better. She still has some wheezing remaining but there is improved air movement and less cough. Also recommend taking an antihistamine such as Allegra or Zyrtec daily.    Janne Napoleon, NP 07/09/15 2028

## 2015-07-09 NOTE — ED Notes (Signed)
C/o cold sx onset x1 week associated w/SOB, wheezing, prod cough, congestion and fevers A&O x4... No acute distress.

## 2015-07-09 NOTE — Discharge Instructions (Signed)
Bronchospasm, Adult A bronchospasm is when the tubes that carry air in and out of your lungs (airways) spasm or tighten. During a bronchospasm it is hard to breathe. This is because the airways get smaller. A bronchospasm can be triggered by: 1. Allergies. These may be to animals, pollen, food, or mold. 2. Infection. This is a common cause of bronchospasm. 3. Exercise. 4. Irritants. These include pollution, cigarette smoke, strong odors, aerosol sprays, and paint fumes. 5. Weather changes. 6. Stress. 7. Being emotional. HOME CARE   Always have a plan for getting help. Know when to call your doctor and local emergency services (911 in the [place] Know where you can get emergency care.  Only take medicines as told by your doctor.  If you were prescribed an inhaler or nebulizer machine, ask your doctor how to use it correctly. Always use a spacer with your inhaler if you were given one.  Stay calm during an attack. Try to relax and breathe more slowly.  Control your home environment:  Change your heating and air conditioning filter at least once a month.  Limit your use of fireplaces and wood stoves.  Do not  smoke. Do not  allow smoking in your home.  Avoid perfumes and fragrances.  Get rid of pests (such as roaches and mice) and their droppings.  Throw away plants if you see mold on them.  Keep your house clean and dust free.  Replace carpet with wood, tile, or vinyl flooring. Carpet can trap dander and dust.  Use allergy-proof pillows, mattress covers, and box spring covers.  Wash bed sheets and blankets every week in hot water. Dry them in a dryer.  Use blankets that are made of polyester or cotton.  Wash hands frequently. GET HELP IF:  You have muscle aches.  You have chest pain.  The thick spit you spit or cough up (sputum) changes from clear or white to yellow, green, gray, or bloody.  The thick spit you spit or cough up gets thicker.  There are problems that  may be related to the medicine you are given such as:  A rash.  Itching.  Swelling.  Trouble breathing. GET HELP RIGHT AWAY IF:  You feel you cannot breathe or catch your breath.  You cannot stop coughing.  Your treatment is not helping you breathe better.  You have very bad chest pain. MAKE SURE YOU:   Understand these instructions.  Will watch your condition.  Will get help right away if you are not doing well or get worse.   This information is not intended to replace advice given to you by your health care provider. Make sure you discuss any questions you have with your health care provider.   Document Released: 10/31/2008 Document Revised: 01/24/2014 Document Reviewed: 06/26/2012 Elsevier Interactive Patient Education 2016 Elsevier Inc[employer]  Chest Wall Pain Chest wall pain is pain in or around the bones and muscles of your chest. Sometimes, an injury causes this pain. Sometimes, the cause may not be known. This pain may take several weeks or longer to get better. HOME CARE Pay attention to any changes in your symptoms. Take these actions to help with your pain: 8. Rest as told by your doctor. 9. Avoid activities that cause pain. Try not to use your chest, belly (abdominal), or side muscles to lift heavy things. 10. If directed, apply ice to the painful area: 1. Put ice in a plastic bag. 2. Place a towel between your skin and the  bag. 3. Leave the ice on for 20 minutes, 2-3 times per day. 11. Take over-the-counter and prescription medicines only as told by your doctor. 12. Do not use tobacco products, including cigarettes, chewing tobacco, and e-cigarettes. If you need help quitting, ask your doctor. 81. Keep all follow-up visits as told by your doctor. This is important. GET HELP IF:  You have a fever.  Your chest pain gets worse.  You have new symptoms. GET HELP RIGHT AWAY IF:  You feel sick to your stomach (nauseous) or you throw up (vomit).  You feel sweaty  or light-headed.  You have a cough with phlegm (sputum) or you cough up blood.  You are short of breath.   This information is not intended to replace advice given to you by your health care provider. Make sure you discuss any questions you have with your health care provider.   Document Released: 06/22/2007 Document Revised: 09/24/2014 Document Reviewed: 03/31/2014 Elsevier Interactive Patient Education 2016 [employer].  How to Use an Inhaler Using your inhaler correctly is very important. Good technique will make sure that the medicine reaches your lungs.  HOW TO USE AN INHALER: 14. Take the cap off the inhaler. 15. If this is the first time using your inhaler, you need to prime it. Shake the inhaler for 5 seconds. Release four puffs into the air, away from your face. Ask your doctor for help if you have questions. 16. Shake the inhaler for 5 seconds. 17. Turn the inhaler so the bottle is above the mouthpiece. 18. Put your pointer finger on top of the bottle. Your thumb holds the bottom of the inhaler. 19. Open your mouth. 20. Either hold the inhaler away from your mouth (the width of 2 fingers) or place your lips tightly around the mouthpiece. Ask your doctor which way to use your inhaler. 21. Breathe out as much air as possible. 22. Breathe in and push down on the bottle 1 time to release the medicine. You will feel the medicine go in your mouth and throat. 23. Continue to take a deep breath in very slowly. Try to fill your lungs. 24. After you have breathed in completely, hold your breath for 10 seconds. This will help the medicine to settle in your lungs. If you cannot hold your breath for 10 seconds, hold it for as long as you can before you breathe out. 25. Breathe out slowly, through pursed lips. Whistling is an example of pursed lips. 26. If your doctor has told you to take more than 1 puff, wait at least 15-30 seconds between puffs. This will help you get the best results from  your medicine. Do not use the inhaler more than your doctor tells you to. 27. Put the cap back on the inhaler. 28. Follow the directions from your doctor or from the inhaler package about cleaning the inhaler. If you use more than one inhaler, ask your doctor which inhalers to use and what order to use them in. Ask your doctor to help you figure out when you will need to refill your inhaler.  If you use a steroid inhaler, always rinse your mouth with water after your last puff, gargle and spit out the water. Do not swallow the water. GET HELP IF:  The inhaler medicine only partially helps to stop wheezing or shortness of breath.  You are having trouble using your inhaler.  You have some increase in thick spit (phlegm). GET HELP RIGHT AWAY IF:  The inhaler  medicine does not help your wheezing or shortness of breath or you have tightness in your chest.  You have dizziness, headaches, or fast heart rate.  You have chills, fever, or night sweats.  You have a large increase of thick spit, or your thick spit is bloody. MAKE SURE YOU:   Understand these instructions.  Will watch your condition.  Will get help right away if you are not doing well or get worse.   This information is not intended to replace advice given to you by your health care provider. Make sure you discuss any questions you have with your health care provider.   Document Released: 10/13/2007 Document Revised: 10/24/2012 Document Reviewed: 08/02/2012 Elsevier Interactive Patient Education [employer].

## 2015-07-11 ENCOUNTER — Emergency Department (HOSPITAL_BASED_OUTPATIENT_CLINIC_OR_DEPARTMENT_OTHER)
Admission: EM | Admit: 2015-07-11 | Discharge: 2015-07-11 | Disposition: A | Discharge status: Home / Self Care | Payer: Managed Care, Other (non HMO) | Attending: Emergency Medicine | Admitting: Emergency Medicine

## 2015-07-11 ENCOUNTER — Encounter (HOSPITAL_BASED_OUTPATIENT_CLINIC_OR_DEPARTMENT_OTHER): Payer: Self-pay | Admitting: *Deleted

## 2015-07-11 ENCOUNTER — Emergency Department (HOSPITAL_BASED_OUTPATIENT_CLINIC_OR_DEPARTMENT_OTHER): Payer: Managed Care, Other (non HMO)

## 2015-07-11 DIAGNOSIS — J209 Acute bronchitis, unspecified: Secondary | ICD-10-CM | POA: Insufficient documentation

## 2015-07-11 DIAGNOSIS — Z87891 Personal history of nicotine dependence: Secondary | ICD-10-CM | POA: Insufficient documentation

## 2015-07-11 DIAGNOSIS — M549 Dorsalgia, unspecified: Secondary | ICD-10-CM | POA: Insufficient documentation

## 2015-07-11 DIAGNOSIS — R0602 Shortness of breath: Secondary | ICD-10-CM | POA: Diagnosis present

## 2015-07-11 MED ORDER — IPRATROPIUM-ALBUTEROL 0.5-2.5 (3) MG/3ML IN SOLN
RESPIRATORY_TRACT | Status: AC
  Administered 2015-07-11: 3 mL
  Filled 2015-07-11: qty 3

## 2015-07-11 MED ORDER — AZITHROMYCIN 250 MG PO TABS: ORAL_TABLET | ORAL | Status: DC

## 2015-07-11 MED ORDER — PANTOPRAZOLE SODIUM 20 MG PO TBEC: 20.0000 mg | DELAYED_RELEASE_TABLET | ORAL | Status: DC | PRN

## 2015-07-11 MED ORDER — ALBUTEROL (5 MG/ML) CONTINUOUS INHALATION SOLN
10.0000 mg/h | INHALATION_SOLUTION | RESPIRATORY_TRACT | Status: DC
  Administered 2015-07-11: 10 mg/h via RESPIRATORY_TRACT
  Filled 2015-07-11: qty 20

## 2015-07-11 MED ORDER — METHYLPREDNISOLONE SODIUM SUCC 125 MG IJ SOLR
125.0000 mg | Freq: Once | INTRAMUSCULAR | Status: AC
  Administered 2015-07-11: 125 mg via INTRAMUSCULAR
  Filled 2015-07-11: qty 2

## 2015-07-11 MED ORDER — ALBUTEROL SULFATE (2.5 MG/3ML) 0.083% IN NEBU
INHALATION_SOLUTION | RESPIRATORY_TRACT | Status: AC
  Administered 2015-07-11: 2.5 mg
  Filled 2015-07-11: qty 3

## 2015-07-11 NOTE — ED Provider Notes (Signed)
CSN: [id number]     Arrival date & time 07/11/15  [id number] History   First MD Initiated Contact with Patient 07/11/15 [phone]     Chief Complaint  Patient presents with  . Shortness of Breath     (Consider location/radiation/quality/duration/timing/severity/associated sxs/prior Treatment) HPI Patient presents with worsening shortness of breath and wheezing for the last few days. Was seen in urgent care clinic and diagnosed with bronchospasm. Given albuterol and prednisone. Patient states she also has a cough productive of yellow sputum. Husband was recently diagnosed with bronchitis. No history of asthma. Denies any new lower extremity swelling or pain. Patient does complain of some thoracic back pain, between her shoulder blades worse with deep inspiration and coughing. Past Medical History  Diagnosis Date  . Gastritis   . Acid reflux   . Frequent UTI   . Infections of kidney   . Thyroid disease   . Irritable bowel syndrome (IBS)   . History of benign breast tumor    Past Surgical History  Procedure Laterality Date  . Dilation and curettage of uterus    . Induced abortion    . Laser ablation of the cervix    . C sections    . Tubal ligation     Family History  Problem Relation Age of Onset  . Heart disease    . Arthritis    . Cancer    . Asthma    . Diabetes    . Drug abuse Mother   . Bipolar disorder Father   . Drug abuse Father   . Depression Sister   . Anxiety disorder Sister   . Bipolar disorder Paternal Uncle    Social History  Substance Use Topics  . Smoking status: Former Smoker -- 0.25 packs/day  . Smokeless tobacco: Never Used  . Alcohol Use: No   OB History    No data available     Review of Systems  Constitutional: Negative for fever, chills and fatigue.  HENT: Positive for congestion. Negative for facial swelling, sinus pressure and sore throat.   Respiratory: Positive for cough, shortness of breath and wheezing.   Cardiovascular: Negative for chest  pain, palpitations and leg swelling.  Gastrointestinal: Negative for nausea, vomiting, abdominal pain and diarrhea.  Musculoskeletal: Positive for back pain. Negative for myalgias, arthralgias, neck pain and neck stiffness.  Skin: Negative for rash and wound.  Neurological: Negative for dizziness, weakness, light-headedness, numbness and headaches.  All other systems reviewed and are negative.     Allergies  Imitrex  Home Medications   Prior to Admission medications   Medication Sig Start Date End Date Taking? Authorizing Provider  albuterol (PROVENTIL HFA;VENTOLIN HFA) 108 (90 Base) MCG/ACT inhaler Inhale 2 puffs into the lungs every 4 (four) hours as needed for wheezing or shortness of breath. 07/09/15   Janne Napoleon, NP  ALPRAZolam Duanne Moron) 1 MG tablet Take 1 tablet (1 mg total) by mouth 3 (three) times daily. 05/15/14   Cloria Spring, MD  azithromycin (ZITHROMAX Z-PAK) 250 MG tablet 2 po day one, then 1 daily x 4 days 07/11/15   Julianne Rice, MD  benzonatate (TESSALON) 100 MG capsule Take 1 capsule (100 mg total) by mouth every 8 (eight) hours. 12/12/14   Deno Etienne, DO  desvenlafaxine (PRISTIQ) 100 MG 24 hr tablet Take 1 tablet (100 mg total) by mouth daily. 05/15/14   Cloria Spring, MD  dicyclomine (BENTYL) 10 MG capsule Take 10 mg by mouth 4 (four) times daily. 02/20/13  Historical Provider, MD  pantoprazole (PROTONIX) 20 MG tablet Take 1 tablet (20 mg total) by mouth as needed. 07/11/15   Julianne Rice, MD  predniSONE (DELTASONE) 20 MG tablet Take 3 tabs po on first day, 2 tabs second day, 2 tabs third day, 1 tab fourth day, 1 tab 5th day. Take with food. 07/09/15   Janne Napoleon, NP  sertraline (ZOLOFT) 25 MG tablet Take 25 mg by mouth daily.    Historical Provider, MD  SYNTHROID 25 MCG tablet Take 25 mcg by mouth daily before breakfast.  04/15/14   Historical Provider, MD   BP 114/65 mmHg  Pulse 98  Temp(Src) 97.8 F (36.6 C) (Oral)  Resp 18  Ht 5\' 4"  (1.626 m)  Wt 196 lb (88.905  kg)  BMI 33.63 kg/m2  SpO2 100% Physical Exam  Constitutional: She is oriented to person, place, and time. She appears well-developed and well-nourished. No distress.  HENT:  Head: Normocephalic and atraumatic.  Mouth/Throat: Oropharynx is clear and moist. No oropharyngeal exudate.  Bilateral nasal mucosal edema.  Eyes: EOM are normal. Pupils are equal, round, and reactive to light.  Neck: Normal range of motion. Neck supple.  Cardiovascular: Normal rate and regular rhythm.  Exam reveals no gallop and no friction rub.   No murmur heard. Pulmonary/Chest: Effort normal. No respiratory distress. She has wheezes. She has no rales.  Inspiratory and expiratory wheezing in all lung fields  Abdominal: Soft. Bowel sounds are normal. She exhibits no distension and no mass. There is no tenderness. There is no rebound and no guarding.  Musculoskeletal: Normal range of motion. She exhibits no edema or tenderness.  No lower extremity asymmetry, tenderness or warmth. 2+ distal pulses  Lymphadenopathy:    She has no cervical adenopathy.  Neurological: She is alert and oriented to person, place, and time.  Moves all extremities without deficit. Sensation is fully intact  Skin: Skin is warm and dry. No rash noted. No erythema.  Psychiatric: She has a normal mood and affect. Her behavior is normal.  Nursing note and vitals reviewed.   ED Course  Procedures (including critical care time) Labs Review Labs Reviewed - No data to display  Imaging Review Dg Chest 2 View  07/11/2015  CLINICAL DATA:  Cough and shortness of breath EXAM: CHEST  2 VIEW COMPARISON:  12/12/2014 chest radiograph. FINDINGS: Stable cardiomediastinal silhouette with normal heart size. No pneumothorax. No pleural effusion. Lungs appear clear, with no acute consolidative airspace disease and no pulmonary edema. IMPRESSION: No active cardiopulmonary disease. Electronically Signed   By: Ilona Sorrel M.D.   On: 07/11/2015 09:39   I have  personally reviewed and evaluated these images and lab results as part of my medical decision-making.   EKG Interpretation None      MDM   Final diagnoses:  Bronchitis with bronchospasm   Patient's wheezing has improved. States she is feeling much better. Chest x-ray without any acute infiltrates. Likely has bronchitis with spasm. Advised to continue prednisone and will give abx given a fever and extended symptoms. Return precautions given.     Julianne Rice, MD 07/13/15 [phone]

## 2015-07-11 NOTE — ED Notes (Signed)
Pt has been sob x3 days and was seen at [facility] (no x-ray, had breathing treatment and prednisone-dx with bronchiospasm and sent home)  Pt states that the sob has been getting worse, pt has a productive cough with yellow sputum, pt reports slight fever 2 days ago and a sharp pain in back.  Pt is able to speak in full sentences and has some wet sounding cough while I am triaging her.

## 2015-07-11 NOTE — Discharge Instructions (Signed)

## 2015-09-14 ENCOUNTER — Emergency Department (HOSPITAL_COMMUNITY): Payer: Managed Care, Other (non HMO)

## 2015-09-14 ENCOUNTER — Emergency Department (HOSPITAL_COMMUNITY)
Admission: EM | Admit: 2015-09-14 | Discharge: 2015-09-14 | Disposition: A | Discharge status: Home / Self Care | Payer: Managed Care, Other (non HMO) | Attending: Physician Assistant | Admitting: Physician Assistant

## 2015-09-14 ENCOUNTER — Encounter (HOSPITAL_COMMUNITY): Payer: Self-pay | Admitting: Emergency Medicine

## 2015-09-14 DIAGNOSIS — Z87891 Personal history of nicotine dependence: Secondary | ICD-10-CM | POA: Insufficient documentation

## 2015-09-14 DIAGNOSIS — K2970 Gastritis, unspecified, without bleeding: Secondary | ICD-10-CM | POA: Insufficient documentation

## 2015-09-14 DIAGNOSIS — R112 Nausea with vomiting, unspecified: Secondary | ICD-10-CM | POA: Diagnosis present

## 2015-09-14 LAB — BASIC METABOLIC PANEL
Anion gap: 9 (ref 5–15)
BUN: 11 mg/dL (ref 6–20)
CALCIUM: 8.4 mg/dL — AB (ref 8.9–10.3)
CO2: 23 mmol/L (ref 22–32)
CREATININE: 0.7 mg/dL (ref 0.44–1.00)
Chloride: 103 mmol/L (ref 101–111)
Glucose, Bld: 148 mg/dL — ABNORMAL HIGH (ref 65–99)
POTASSIUM: 3.1 mmol/L — AB (ref 3.5–5.1)
SODIUM: 135 mmol/L (ref 135–145)

## 2015-09-14 LAB — CBC
HCT: 37.9 % (ref 36.0–46.0)
Hemoglobin: 12.9 g/dL (ref 12.0–15.0)
MCH: 31.9 pg (ref 26.0–34.0)
MCHC: 34 g/dL (ref 30.0–36.0)
MCV: 93.6 fL (ref 78.0–100.0)
PLATELETS: 179 10*3/uL (ref 150–400)
RBC: 4.05 MIL/uL (ref 3.87–5.11)
RDW: 13.7 % (ref 11.5–15.5)
WBC: 10.9 10*3/uL — AB (ref 4.0–10.5)

## 2015-09-14 LAB — I-STAT TROPONIN, ED: TROPONIN I, POC: 0.01 ng/mL (ref 0.00–0.08)

## 2015-09-14 MED ORDER — GI COCKTAIL ~~LOC~~
30.0000 mL | Freq: Once | ORAL | Status: AC
  Administered 2015-09-14: 30 mL via ORAL
  Filled 2015-09-14: qty 30

## 2015-09-14 MED ORDER — ALUM & MAG HYDROXIDE-SIMETH 200-200-20 MG/5ML PO SUSP
15.0000 mL | Freq: Once | ORAL | Status: AC
  Administered 2015-09-14: 15 mL via ORAL
  Filled 2015-09-14: qty 30

## 2015-09-14 MED ORDER — OMEPRAZOLE 20 MG PO CPDR: 20.0000 mg | DELAYED_RELEASE_CAPSULE | Freq: Two times a day (BID) | ORAL | 0 refills | Status: DC

## 2015-09-14 MED ORDER — LIDOCAINE VISCOUS 2 % MT SOLN
15.0000 mL | Freq: Once | OROMUCOSAL | Status: AC
  Administered 2015-09-14: 15 mL via OROMUCOSAL
  Filled 2015-09-14: qty 15

## 2015-09-14 MED ORDER — RANITIDINE HCL 150 MG/10ML PO SYRP
150.0000 mg | ORAL_SOLUTION | Freq: Once | ORAL | Status: AC
  Administered 2015-09-14: 150 mg via ORAL
  Filled 2015-09-14: qty 10

## 2015-09-14 MED ORDER — PANTOPRAZOLE SODIUM 40 MG PO TBEC
40.0000 mg | DELAYED_RELEASE_TABLET | Freq: Every day | ORAL | Status: DC
  Administered 2015-09-14: 40 mg via ORAL
  Filled 2015-09-14: qty 1

## 2015-09-14 MED ORDER — SUCRALFATE 1 GM/10ML PO SUSP: 1.0000 g | Freq: Three times a day (TID) | ORAL | 0 refills | Status: DC

## 2015-09-14 MED ORDER — SODIUM CHLORIDE 0.9 % IV BOLUS (SEPSIS)
1000.0000 mL | Freq: Once | INTRAVENOUS | Status: AC
  Administered 2015-09-14: 1000 mL via INTRAVENOUS

## 2015-09-14 MED ORDER — SUCRALFATE 1 GM/10ML PO SUSP
1.0000 g | Freq: Three times a day (TID) | ORAL | Status: DC
  Administered 2015-09-14: 1 g via ORAL
  Filled 2015-09-14: qty 10

## 2015-09-14 NOTE — Discharge Instructions (Signed)
We think that your symptoms are either from an ulcer, gastritis, or GERD. Please take the medications that he continued to help with the symptoms. Please call GI and follow-up shortly. Please return if you have any concerning symptoms.

## 2015-09-14 NOTE — ED Provider Notes (Signed)
[facility] DEPT Provider Note   CSN: [id number] Arrival date & time: 09/14/15  0302  By signing my name below, I, Higinio Plan, attest that this documentation has been prepared under the direction and in the presence of Eusebia Grulke Julio Alm, MD . Electronically Signed: Higinio Plan, Scribe. 09/14/2015. 4:02 AM.  History   Chief Complaint Chief Complaint  Patient presents with  . Shortness of Breath  . Gastroesophageal Reflux   The history is provided by the patient. No language interpreter was used.   HPI Comments: Jackie Smith is a 30 y.o. female with PMHx of acid reflux, who presents to the Emergency Department complaining of sudden onset, gradually worsening, shortness of breath that began this morning. Pt reports she awoke from sleep this morning due to difficulty breathing associated with 2 episodes of vomiting; she notes a "burning" sensation in her esophagus that is still present now. She states she has been taking Protonix for the past 2 years but has stopped taking it for the past month because it was "ineffective." Per EMS, pt was given 4 mg Zofran en route to ED with mild relief.   Past Medical History:  Diagnosis Date  . Acid reflux   . Frequent UTI   . Gastritis   . History of benign breast tumor   . Infections of kidney   . Irritable bowel syndrome (IBS)   . Thyroid disease     Patient Active Problem List   Diagnosis Date Noted  . Major depression ([place]) 05/15/2014    Past Surgical History:  Procedure Laterality Date  . c sections    . DILATION AND CURETTAGE OF UTERUS    . INDUCED ABORTION    . LASER ABLATION OF THE CERVIX    . TUBAL LIGATION      OB History    No data available       Home Medications    Prior to Admission medications   Medication Sig Start Date End Date Taking? Authorizing Provider  ALPRAZolam Duanne Moron) 1 MG tablet Take 1 tablet (1 mg total) by mouth 3 (three) times daily. 05/15/14  Yes Cloria Spring, MD  pantoprazole  (PROTONIX) 20 MG tablet Take 1 tablet (20 mg total) by mouth as needed. 07/11/15  Yes Julianne Rice, MD  sertraline (ZOLOFT) 50 MG tablet Take 50 mg by mouth 2 (two) times daily.   Yes Historical Provider, MD  SYNTHROID 25 MCG tablet Take 25 mcg by mouth daily before breakfast.  04/15/14  Yes Historical Provider, MD  albuterol (PROVENTIL HFA;VENTOLIN HFA) 108 (90 Base) MCG/ACT inhaler Inhale 2 puffs into the lungs every 4 (four) hours as needed for wheezing or shortness of breath. Patient not taking: Reported on 09/14/2015 07/09/15   Janne Napoleon, NP  azithromycin (ZITHROMAX Z-PAK) 250 MG tablet 2 po day one, then 1 daily x 4 days Patient not taking: Reported on 09/14/2015 07/11/15   Julianne Rice, MD  benzonatate (TESSALON) 100 MG capsule Take 1 capsule (100 mg total) by mouth every 8 (eight) hours. Patient not taking: Reported on 09/14/2015 12/12/14   Deno Etienne, DO  desvenlafaxine (PRISTIQ) 100 MG 24 hr tablet Take 1 tablet (100 mg total) by mouth daily. Patient not taking: Reported on 09/14/2015 05/15/14   Cloria Spring, MD  omeprazole (PRILOSEC) 20 MG capsule Take 1 capsule (20 mg total) by mouth 2 (two) times daily before a meal. 09/14/15   Nattaly Yebra Lyn Damarea Merkel, MD  predniSONE (DELTASONE) 20 MG tablet Take 3 tabs po on  first day, 2 tabs second day, 2 tabs third day, 1 tab fourth day, 1 tab 5th day. Take with food. Patient not taking: Reported on 09/14/2015 07/09/15   Janne Napoleon, NP  sucralfate (CARAFATE) 1 GM/10ML suspension Take 10 mLs (1 g total) by mouth 4 (four) times daily -  with meals and at bedtime. 09/14/15   Noora Locascio Lyn Rashana Andrew, MD    Family History Family History  Problem Relation Age of Onset  . Drug abuse Mother   . Bipolar disorder Father   . Drug abuse Father   . Heart disease    . Arthritis    . Cancer    . Asthma    . Diabetes    . Depression Sister   . Anxiety disorder Sister   . Bipolar disorder Paternal Uncle     Social History Social History  Substance Use Topics   . Smoking status: Former Smoker    Packs/day: 0.25  . Smokeless tobacco: Never Used  . Alcohol use No     Allergies   Imitrex [sumatriptan base]   Review of Systems Review of Systems  Respiratory: Positive for shortness of breath.   Gastrointestinal: Positive for nausea and vomiting.   Physical Exam Updated Vital Signs BP 103/70 (BP Location: Right Arm)   Pulse 67   Temp 98.5 F (36.9 C) (Oral)   Resp 14   Ht 5\' 4"  (1.626 m)   Wt 200 lb (90.7 kg)   SpO2 95%   BMI 34.33 kg/m   Physical Exam  Constitutional: She is oriented to person, place, and time. She appears well-developed and well-nourished.  HENT:  Head: Normocephalic and atraumatic.  Eyes: Conjunctivae are normal. Pupils are equal, round, and reactive to light. Right eye exhibits no discharge. Left eye exhibits no discharge. No scleral icterus.  Neck: Normal range of motion. No JVD present. No tracheal deviation present.  Pulmonary/Chest: Effort normal. No stridor.  Neurological: She is alert and oriented to person, place, and time. Coordination normal.  Psychiatric: She has a normal mood and affect. Her behavior is normal. Judgment and thought content normal.  Nursing note and vitals reviewed.  ED Treatments / Results  Labs (all labs ordered are listed, but only abnormal results are displayed) Labs Reviewed  BASIC METABOLIC PANEL - Abnormal; Notable for the following:       Result Value   Potassium 3.1 (*)    Glucose, Bld 148 (*)    Calcium 8.4 (*)    All other components within normal limits  CBC - Abnormal; Notable for the following:    WBC 10.9 (*)    All other components within normal limits  I-STAT TROPOININ, ED    EKG  EKG Interpretation  Date/Time:  Monday September 14 2015 03:17:15 EDT Ventricular Rate:  59 PR Interval:    QRS Duration: 92 QT Interval:  427 QTC Calculation: 423 R Axis:   60 Text Interpretation:  Sinus rhythm Borderline short PR interval Low voltage, precordial leads  Nonspecific T abnrm, anterolateral leads No significant change since last tracing Confirmed by Gerald Leitz ([id number]) on 09/14/2015 4:16:13 AM       Radiology Dg Chest 2 View  Result Date: 09/14/2015 CLINICAL DATA:  Awakened by dyspnea and burning chest discomfort EXAM: CHEST  2 VIEW COMPARISON:  07/11/2015 FINDINGS: The heart size and mediastinal contours are within normal limits. Both lungs are clear. The visualized skeletal structures are unremarkable. IMPRESSION: No active cardiopulmonary disease. Electronically Signed   By: Shaune Pascal  Alroy Dust M.D.   On: 09/14/2015 03:45    Procedures Procedures  DIAGNOSTIC STUDIES:  Oxygen Saturation is 96% on RA, normal by my interpretation.    COORDINATION OF CARE:  4:00 AM Discussed treatment plan with pt at bedside and pt agreed to plan.  Medications Ordered in ED Medications  pantoprazole (PROTONIX) EC tablet 40 mg (40 mg Oral Given 09/14/15 0402)  sucralfate (CARAFATE) 1 GM/10ML suspension 1 g (1 g Oral Given 09/14/15 0637)  gi cocktail (Maalox,Lidocaine,Donnatal) (30 mLs Oral Given 09/14/15 0403)  ranitidine (ZANTAC) 150 MG/10ML syrup 150 mg (150 mg Oral Given 09/14/15 0402)  sodium chloride 0.9 % bolus 1,000 mL (0 mLs Intravenous Stopped 09/14/15 0637)  lidocaine (XYLOCAINE) 2 % viscous mouth solution 15 mL (15 mLs Mouth/Throat Given 09/14/15 0525)  alum & mag hydroxide-simeth (MAALOX/MYLANTA) 200-200-20 MG/5ML suspension 15 mL (15 mLs Oral Given 09/14/15 0524)    Initial Impression / Assessment and Plan / ED Course  I have reviewed the triage vital signs and the nursing notes.  Pertinent labs & imaging results that were available during my care of the patient were reviewed by me and considered in my medical decision making (see chart for details).  Clinical Course    Patient is a 30 year old female here with epigastric bruning. Vomited X2. Was on protonix, discontinued 2 weeks ago. Will treat for ulcer vs GERD. Physical exam normal.    Will put on omeprazole, give instructions for tight follow up with GI.   Patient is comfortable, ambulatory, and taking PO at time of discharge.  Patient expressed understanding about return precautions.    I personally performed the services described in this documentation, which was scribed in my presence. The recorded information has been reviewed and is accurate.   Final Clinical Impressions(s) / ED Diagnoses   Final diagnoses:  Gastritis    New Prescriptions Discharge Medication List as of 09/14/2015  6:10 AM    START taking these medications   Details  omeprazole (PRILOSEC) 20 MG capsule Take 1 capsule (20 mg total) by mouth 2 (two) times daily before a meal., Starting Mon 09/14/2015, Print    sucralfate (CARAFATE) 1 GM/10ML suspension Take 10 mLs (1 g total) by mouth 4 (four) times daily -  with meals and at bedtime., Starting Mon 09/14/2015, Print         Marciana Uplinger Julio Alm, MD 09/14/15 [phone]

## 2015-09-14 NOTE — ED Triage Notes (Signed)
Pt presents from home with sudden onset of burning in the chest with SOB; pt reports hx of GERD and off protonix x 1 month; EMS gave 4mg  zofran enroute which pt states helped for a little while; EMS also reports stable VS enroute to ER; pt A&O, NAD, resp labored

## 2015-09-14 NOTE — ED Notes (Signed)
Patient transported to X-ray 

## 2015-09-23 ENCOUNTER — Encounter: Payer: Self-pay | Admitting: Internal Medicine

## 2015-10-12 ENCOUNTER — Emergency Department (HOSPITAL_COMMUNITY)
Admission: EM | Admit: 2015-10-12 | Discharge: 2015-10-12 | Discharge status: Absent without leave | Payer: Managed Care, Other (non HMO)

## 2015-10-23 ENCOUNTER — Emergency Department (HOSPITAL_COMMUNITY)
Admission: EM | Admit: 2015-10-23 | Discharge: 2015-10-23 | Disposition: A | Discharge status: Home / Self Care | Payer: Managed Care, Other (non HMO) | Attending: Emergency Medicine | Admitting: Emergency Medicine

## 2015-10-23 ENCOUNTER — Encounter (HOSPITAL_COMMUNITY): Payer: Self-pay | Admitting: *Deleted

## 2015-10-23 DIAGNOSIS — J069 Acute upper respiratory infection, unspecified: Secondary | ICD-10-CM | POA: Diagnosis not present

## 2015-10-23 DIAGNOSIS — Z87891 Personal history of nicotine dependence: Secondary | ICD-10-CM | POA: Diagnosis not present

## 2015-10-23 DIAGNOSIS — R062 Wheezing: Secondary | ICD-10-CM | POA: Diagnosis present

## 2015-10-23 DIAGNOSIS — Z79899 Other long term (current) drug therapy: Secondary | ICD-10-CM | POA: Insufficient documentation

## 2015-10-23 DIAGNOSIS — M545 Low back pain: Secondary | ICD-10-CM | POA: Diagnosis not present

## 2015-10-23 NOTE — ED Notes (Signed)
Pt also states that she has not had her thyroid medication in the past 3 weeks,

## 2015-10-23 NOTE — ED Provider Notes (Signed)
[facility] DEPT Provider Note   CSN: [id number] Arrival date & time: 10/23/15  1955     History   Chief Complaint Chief Complaint  Patient presents with  . Wheezing    HPI Jackie Smith is a 30 y.o. female.  HPI Patient presents with multiple complaints. First states that she's been wheezing for the last 2 weeks. She's had a little bit of a minimally productive cough. Some sore throat and nasal congestion also. Dull chest pain. Has had some muscle aches. She also states that a few days ago she noticed some bruising to her abdomen. States it looks like there is superficial bruises. No tenderness. States she does not remember getting hit in the abdomen. No bleeding anywhere else. She does not bruise easily. Also about an hour before coming to the ER she developed some right lower back pain. No dysuria. No fevers or chills. Denies possibility of pregnancy. Also states she's been off her thyroid medicine for the last 3 weeks. States she just was busy and wasn't taking. She states she had had some epigastric pain. To start back on Prilosec. States that she has follow-up with GI.   Past Medical History:  Diagnosis Date  . Acid reflux   . Frequent UTI   . Gastritis   . History of benign breast tumor   . Infections of kidney   . Irritable bowel syndrome (IBS)   . Thyroid disease     Patient Active Problem List   Diagnosis Date Noted  . Major depression 05/15/2014    Past Surgical History:  Procedure Laterality Date  . c sections    . DILATION AND CURETTAGE OF UTERUS    . INDUCED ABORTION    . LASER ABLATION OF THE CERVIX    . TUBAL LIGATION      OB History    No data available       Home Medications    Prior to Admission medications   Medication Sig Start Date End Date Taking? Authorizing Provider  ALPRAZolam Duanne Moron) 1 MG tablet Take 1 tablet (1 mg total) by mouth 3 (three) times daily. 05/15/14  Yes Cloria Spring, MD  omeprazole (PRILOSEC) 20 MG  capsule Take 1 capsule (20 mg total) by mouth 2 (two) times daily before a meal. 09/14/15  Yes Courteney Lyn Mackuen, MD  sertraline (ZOLOFT) 100 MG tablet Take 100 mg by mouth 2 (two) times daily.    Yes Historical Provider, MD  SYNTHROID 25 MCG tablet Take 25 mcg by mouth daily before breakfast.  04/15/14  Yes Historical Provider, MD  zolpidem (AMBIEN CR) 12.5 MG CR tablet Take 12.5 mg by mouth at bedtime as needed for sleep.   Yes Historical Provider, MD  albuterol (PROVENTIL HFA;VENTOLIN HFA) 108 (90 Base) MCG/ACT inhaler Inhale 2 puffs into the lungs every 4 (four) hours as needed for wheezing or shortness of breath. Patient not taking: Reported on 09/14/2015 07/09/15   Janne Napoleon, NP  azithromycin (ZITHROMAX Z-PAK) 250 MG tablet 2 po day one, then 1 daily x 4 days Patient not taking: Reported on 09/14/2015 07/11/15   Julianne Rice, MD  benzonatate (TESSALON) 100 MG capsule Take 1 capsule (100 mg total) by mouth every 8 (eight) hours. Patient not taking: Reported on 09/14/2015 12/12/14   Deno Etienne, DO  desvenlafaxine (PRISTIQ) 100 MG 24 hr tablet Take 1 tablet (100 mg total) by mouth daily. Patient not taking: Reported on 09/14/2015 05/15/14   Cloria Spring, MD  pantoprazole ([place])  20 MG tablet Take 1 tablet (20 mg total) by mouth as needed. Patient not taking: Reported on 10/23/2015 07/11/15   Julianne Rice, MD  predniSONE (DELTASONE) 20 MG tablet Take 3 tabs po on first day, 2 tabs second day, 2 tabs third day, 1 tab fourth day, 1 tab 5th day. Take with food. Patient not taking: Reported on 09/14/2015 07/09/15   Janne Napoleon, NP  sucralfate (CARAFATE) 1 GM/10ML suspension Take 10 mLs (1 g total) by mouth 4 (four) times daily -  with meals and at bedtime. Patient not taking: Reported on 10/23/2015 09/14/15   Courteney Lyn Mackuen, MD    Family History Family History  Problem Relation Age of Onset  . Drug abuse Mother   . Bipolar disorder Father   . Drug abuse Father   . Heart disease    .  Arthritis    . Cancer    . Asthma    . Diabetes    . Depression Sister   . Anxiety disorder Sister   . Bipolar disorder Paternal Uncle     Social History Social History  Substance Use Topics  . Smoking status: Former Smoker    Packs/day: 0.25  . Smokeless tobacco: Never Used  . Alcohol use No     Allergies   Imitrex [sumatriptan base]   Review of Systems Review of Systems  Constitutional: Negative for fever.  HENT: Positive for congestion and sore throat.   Eyes: Negative for photophobia.  Respiratory: Positive for cough.   Cardiovascular: Negative for chest pain.  Gastrointestinal: Negative for abdominal distention, blood in stool, nausea and vomiting.  Genitourinary: Negative for genital sores, urgency and vaginal pain.  Musculoskeletal: Positive for back pain.  Neurological: Negative for seizures.  Hematological: Does not bruise/bleed easily.  Psychiatric/Behavioral: Negative for confusion.     Physical Exam Updated Vital Signs BP 122/89 (BP Location: Left Arm)   Pulse 69   Temp 97.5 F (36.4 C) (Oral)   Resp 18   Ht 5\' 4"  (1.626 m)   Wt 215 lb (97.5 kg)   SpO2 96%   BMI 36.90 kg/m   Physical Exam  Constitutional: She appears well-developed.  HENT:  Head: Atraumatic.  Eyes: EOM are normal.  Neck: No thyromegaly present.  Cardiovascular: Normal rate.   Pulmonary/Chest: Effort normal. She has no wheezes.  Abdominal: Soft. She exhibits no distension and no mass. There is no tenderness.  Some slightly more darkened areas on abdomen but not frank bruises.  Musculoskeletal: She exhibits no edema.  Neurological: She is alert.  Skin: Skin is warm. No erythema.     ED Treatments / Results  Labs (all labs ordered are listed, but only abnormal results are displayed) Labs Reviewed - No data to display  EKG  EKG Interpretation None       Radiology No results found.  Procedures Procedures (including critical care time)  Medications Ordered in  ED Medications - No data to display   Initial Impression / Assessment and Plan / ED Course  I have reviewed the triage vital signs and the nursing notes.  Pertinent labs & imaging results that were available during my care of the patient were reviewed by me and considered in my medical decision making (see chart for details).  Clinical Course  Patient with several complaints. Wheezing may be related to URI. No focal exam findings. Does not appear to need x-ray at this time. Marking on the abdomen does not appear to be clear bruising. It is  slightly darkened. No other signs of bleeding or anemia or thrombocytopenia. Previous records reviewed also. Also a dull back pain. May be muscle skeletal. Denies possibility of pregnancy. No dysuria. Discharge home.  Final Clinical Impressions(s) / ED Diagnoses   Final diagnoses:  Upper respiratory tract infection, unspecified type    New Prescriptions New Prescriptions   No medications on file     Davonna Belling, MD 10/23/15 2118

## 2015-10-23 NOTE — ED Triage Notes (Signed)
Pt c/o wheezing that started two weeks ago, lower back pain that started two hours ago, bruising to abd area that started a week ago,

## 2015-10-23 NOTE — Discharge Instructions (Signed)
Take your medications

## 2015-10-23 NOTE — ED Notes (Signed)
Patient given discharge instruction, verbalized understand. Patient ambulatory out of the department.  

## 2015-11-05 ENCOUNTER — Encounter (HOSPITAL_COMMUNITY): Payer: Self-pay | Admitting: Emergency Medicine

## 2015-11-05 ENCOUNTER — Ambulatory Visit (HOSPITAL_COMMUNITY)
Admission: EM | Admit: 2015-11-05 | Discharge: 2015-11-05 | Disposition: A | Discharge status: Home / Self Care | Payer: Managed Care, Other (non HMO) | Attending: Family Medicine | Admitting: Family Medicine

## 2015-11-05 DIAGNOSIS — E039 Hypothyroidism, unspecified: Secondary | ICD-10-CM

## 2015-11-05 LAB — POCT I-STAT, CHEM 8
BUN: 5 mg/dL — AB (ref 6–20)
CHLORIDE: 100 mmol/L — AB (ref 101–111)
CREATININE: 0.7 mg/dL (ref 0.44–1.00)
Calcium, Ion: 1.24 mmol/L (ref 1.15–1.40)
Glucose, Bld: 99 mg/dL (ref 65–99)
HEMATOCRIT: 40 % (ref 36.0–46.0)
Hemoglobin: 13.6 g/dL (ref 12.0–15.0)
Potassium: 3.8 mmol/L (ref 3.5–5.1)
SODIUM: 138 mmol/L (ref 135–145)
TCO2: 27 mmol/L (ref 0–100)

## 2015-11-05 MED ORDER — LEVOTHYROXINE SODIUM 25 MCG PO TABS: 25.0000 ug | ORAL_TABLET | Freq: Every day | ORAL | 0 refills | Status: DC

## 2015-11-05 NOTE — ED Provider Notes (Signed)
[facility]    CSN: [id number] Arrival date & time: 11/05/15  [place]     History   Chief Complaint Chief Complaint  Patient presents with  . Sore    HPI Jackie Smith is a 30 y.o. female.    Cough  Cough characteristics:  Productive Sputum characteristics:  Yellow Severity:  Mild Onset quality:  Gradual Duration:  2 weeks Chronicity:  Recurrent Smoker: yes   Context comment:  Concern about anemia and thyroid defic.   Past Medical History:  Diagnosis Date  . Acid reflux   . Frequent UTI   . Gastritis   . History of benign breast tumor   . Infections of kidney   . Irritable bowel syndrome (IBS)   . Thyroid disease     Patient Active Problem List   Diagnosis Date Noted  . Major depression 05/15/2014    Past Surgical History:  Procedure Laterality Date  . c sections    . DILATION AND CURETTAGE OF UTERUS    . INDUCED ABORTION    . LASER ABLATION OF THE CERVIX    . TUBAL LIGATION      OB History    No data available       Home Medications    Prior to Admission medications   Medication Sig Start Date End Date Taking? Authorizing Provider  albuterol (PROVENTIL HFA;VENTOLIN HFA) 108 (90 Base) MCG/ACT inhaler Inhale 2 puffs into the lungs every 4 (four) hours as needed for wheezing or shortness of breath. Patient not taking: Reported on 11/05/2015 07/09/15   Janne Napoleon, NP  ALPRAZolam Duanne Moron) 1 MG tablet Take 1 tablet (1 mg total) by mouth 3 (three) times daily. 05/15/14   Cloria Spring, MD  azithromycin (ZITHROMAX Z-PAK) 250 MG tablet 2 po day one, then 1 daily x 4 days Patient not taking: Reported on 11/05/2015 07/11/15   Julianne Rice, MD  benzonatate (TESSALON) 100 MG capsule Take 1 capsule (100 mg total) by mouth every 8 (eight) hours. Patient not taking: Reported on 11/05/2015 12/12/14   Deno Etienne, DO  desvenlafaxine (PRISTIQ) 100 MG 24 hr tablet Take 1 tablet (100 mg total) by mouth daily. Patient not taking: Reported on  11/05/2015 05/15/14   Cloria Spring, MD  omeprazole (PRILOSEC) 20 MG capsule Take 1 capsule (20 mg total) by mouth 2 (two) times daily before a meal. 09/14/15   Courteney Lyn Mackuen, MD  pantoprazole (PROTONIX) 20 MG tablet Take 1 tablet (20 mg total) by mouth as needed. Patient not taking: Reported on 11/05/2015 07/11/15   Julianne Rice, MD  predniSONE (DELTASONE) 20 MG tablet Take 3 tabs po on first day, 2 tabs second day, 2 tabs third day, 1 tab fourth day, 1 tab 5th day. Take with food. Patient not taking: Reported on 11/05/2015 07/09/15   Janne Napoleon, NP  sertraline (ZOLOFT) 100 MG tablet Take 100 mg by mouth 2 (two) times daily.     Historical Provider, MD  sucralfate (CARAFATE) 1 GM/10ML suspension Take 10 mLs (1 g total) by mouth 4 (four) times daily -  with meals and at bedtime. Patient not taking: Reported on 11/05/2015 09/14/15   Courteney Lyn Mackuen, MD  SYNTHROID 25 MCG tablet Take 25 mcg by mouth daily before breakfast.  04/15/14   Historical Provider, MD  zolpidem (AMBIEN CR) 12.5 MG CR tablet Take 12.5 mg by mouth at bedtime as needed for sleep.    Historical Provider, MD    Family History Family History  Problem Relation Age of Onset  . Drug abuse Mother   . Bipolar disorder Father   . Drug abuse Father   . Heart disease    . Arthritis    . Cancer    . Asthma    . Diabetes    . Depression Sister   . Anxiety disorder Sister   . Bipolar disorder Paternal Uncle     Social History Social History  Substance Use Topics  . Smoking status: Former Smoker    Packs/day: 0.25  . Smokeless tobacco: Never Used  . Alcohol use No     Allergies   Imitrex [sumatriptan base]   Review of Systems Review of Systems  Constitutional: Negative.   HENT: Negative.   Respiratory: Positive for cough.   Hematological: Bruises/bleeds easily.  All other systems reviewed and are negative.    Physical Exam Triage Vital Signs ED Triage Vitals [11/05/15 1804]  Enc Vitals Group      BP      Pulse Rate 84     Resp 18     Temp 98.2 F (36.8 C)     Temp Source Oral     SpO2 99 %     Weight      Height      Head Circumference      Peak Flow      Pain Score      Pain Loc      Pain Edu?      Excl. in [place]?    No data found.   Updated Vital Signs Pulse 84   Temp 98.2 F (36.8 C) (Oral)   Resp 18   SpO2 99%   Visual Acuity Right Eye Distance:   Left Eye Distance:   Bilateral Distance:    Right Eye Near:   Left Eye Near:    Bilateral Near:     Physical Exam  Constitutional: She appears well-developed and well-nourished.  HENT:  Mouth/Throat: Oropharynx is clear and moist.  Neck: Normal range of motion. Neck supple. No thyromegaly present.  Cardiovascular: Normal rate, regular rhythm, normal heart sounds and intact distal pulses.   Pulmonary/Chest: She has rhonchi.  Lymphadenopathy:    She has no cervical adenopathy.  Skin: Skin is warm.  Nursing note and vitals reviewed.    UC Treatments / Results  Labs (all labs ordered are listed, but only abnormal results are displayed) Labs Reviewed - No data to display I-stat wnl.  EKG  EKG Interpretation None       Radiology No results found.  Procedures Procedures (including critical care time)  Medications Ordered in UC Medications - No data to display   Initial Impression / Assessment and Plan / UC Course  I have reviewed the triage vital signs and the nursing notes.  Pertinent labs & imaging results that were available during my care of the patient were reviewed by me and considered in my medical decision making (see chart for details).  Clinical Course      Final Clinical Impressions(s) / UC Diagnoses   Final diagnoses:  None    New Prescriptions New Prescriptions   No medications on file     Billy Fischer, MD 11/05/15 [id number]

## 2015-11-05 NOTE — Discharge Instructions (Signed)
Take your medicine and stop smoking, see your doctor for refills.

## 2015-11-05 NOTE — ED Triage Notes (Signed)
Patient has multiple complaints.  Patient reports her main complaint is the generalized bruising she has.  Patient does have bruising to abdomen and patient has no explanation for this.  Patient has no pcp-reports pcp retired.  Patient has been out of synthroid for a month.  Every assessment question triggers a new set of concerns or complaints

## 2015-11-26 ENCOUNTER — Ambulatory Visit: Payer: Self-pay | Admitting: Gastroenterology

## 2015-11-26 ENCOUNTER — Ambulatory Visit: Payer: Self-pay | Admitting: Internal Medicine

## 2015-11-26 ENCOUNTER — Telehealth: Payer: Self-pay

## 2015-11-26 NOTE — Telephone Encounter (Signed)
Called pt to move pts appt up to 1:15pm. Left message for pt to call back.

## 2015-11-26 NOTE — Telephone Encounter (Signed)
Pts appt  Moved to [facility] Bogus PA today at 3pm per Dr. Henrene Pastor.

## 2015-12-02 ENCOUNTER — Encounter (HOSPITAL_COMMUNITY): Payer: Self-pay | Admitting: *Deleted

## 2015-12-02 ENCOUNTER — Emergency Department (HOSPITAL_COMMUNITY): Payer: Managed Care, Other (non HMO)

## 2015-12-02 DIAGNOSIS — Z87891 Personal history of nicotine dependence: Secondary | ICD-10-CM | POA: Insufficient documentation

## 2015-12-02 DIAGNOSIS — R079 Chest pain, unspecified: Secondary | ICD-10-CM | POA: Insufficient documentation

## 2015-12-02 DIAGNOSIS — Z5321 Procedure and treatment not carried out due to patient leaving prior to being seen by health care provider: Secondary | ICD-10-CM | POA: Insufficient documentation

## 2015-12-02 LAB — BASIC METABOLIC PANEL
Anion gap: 7 (ref 5–15)
BUN: 8 mg/dL (ref 6–20)
CHLORIDE: 103 mmol/L (ref 101–111)
CO2: 28 mmol/L (ref 22–32)
CREATININE: 0.7 mg/dL (ref 0.44–1.00)
Calcium: 9.6 mg/dL (ref 8.9–10.3)
GFR calc Af Amer: >60 mL/min (ref >60)
GFR calc non Af Amer: >60 mL/min (ref >60)
GLUCOSE: 105 mg/dL — AB (ref 65–99)
POTASSIUM: 4 mmol/L (ref 3.5–5.1)
Sodium: 138 mmol/L (ref 135–145)

## 2015-12-02 LAB — CBC
HEMATOCRIT: 38.9 % (ref 36.0–46.0)
Hemoglobin: 13.3 g/dL (ref 12.0–15.0)
MCH: 31.4 pg (ref 26.0–34.0)
MCHC: 34.2 g/dL (ref 30.0–36.0)
MCV: 92 fL (ref 78.0–100.0)
PLATELETS: 219 10*3/uL (ref 150–400)
RBC: 4.23 MIL/uL (ref 3.87–5.11)
RDW: 13.4 % (ref 11.5–15.5)
WBC: 9.1 10*3/uL (ref 4.0–10.5)

## 2015-12-02 NOTE — ED Triage Notes (Signed)
Pt c/o sharp chest pain that started x 3 hours ago; pt states she has vomited x 6 and no relief with tums

## 2015-12-03 ENCOUNTER — Emergency Department (HOSPITAL_COMMUNITY)
Admission: EM | Admit: 2015-12-03 | Discharge: 2015-12-03 | Disposition: A | Discharge status: Home / Self Care | Payer: Managed Care, Other (non HMO) | Attending: Emergency Medicine | Admitting: Emergency Medicine

## 2015-12-03 HISTORY — DX: Bipolar disorder, unspecified: F31.9

## 2015-12-03 LAB — TROPONIN I

## 2015-12-03 NOTE — ED Notes (Signed)
Pt not in room when nurse entered for rounding. Corene Cornea with registration says he thought he saw her leaving the ED lobby.

## 2015-12-03 NOTE — ED Notes (Signed)
Spoke with lab- they will run troponin with blood already in lab.

## 2016-03-03 ENCOUNTER — Emergency Department (HOSPITAL_COMMUNITY): Payer: BLUE CROSS/BLUE SHIELD

## 2016-03-03 ENCOUNTER — Emergency Department (HOSPITAL_COMMUNITY)
Admission: EM | Admit: 2016-03-03 | Discharge: 2016-03-03 | Disposition: A | Discharge status: Home / Self Care | Payer: BLUE CROSS/BLUE SHIELD | Attending: Emergency Medicine | Admitting: Emergency Medicine

## 2016-03-03 ENCOUNTER — Encounter (HOSPITAL_COMMUNITY): Payer: Self-pay | Admitting: *Deleted

## 2016-03-03 DIAGNOSIS — R0602 Shortness of breath: Secondary | ICD-10-CM | POA: Diagnosis present

## 2016-03-03 DIAGNOSIS — J209 Acute bronchitis, unspecified: Secondary | ICD-10-CM | POA: Insufficient documentation

## 2016-03-03 DIAGNOSIS — Z79899 Other long term (current) drug therapy: Secondary | ICD-10-CM | POA: Insufficient documentation

## 2016-03-03 DIAGNOSIS — J09X2 Influenza due to identified novel influenza A virus with other respiratory manifestations: Secondary | ICD-10-CM | POA: Insufficient documentation

## 2016-03-03 DIAGNOSIS — Z87891 Personal history of nicotine dependence: Secondary | ICD-10-CM | POA: Diagnosis not present

## 2016-03-03 DIAGNOSIS — J101 Influenza due to other identified influenza virus with other respiratory manifestations: Secondary | ICD-10-CM

## 2016-03-03 LAB — CBC WITH DIFFERENTIAL/PLATELET
Basophils Absolute: 0 10*3/uL (ref 0.0–0.1)
Basophils Relative: 1 %
EOS PCT: 12 %
Eosinophils Absolute: 0.6 10*3/uL (ref 0.0–0.7)
HCT: 39.3 % (ref 36.0–46.0)
Hemoglobin: 13.9 g/dL (ref 12.0–15.0)
LYMPHS ABS: 1 10*3/uL (ref 0.7–4.0)
LYMPHS PCT: 20 %
MCH: 31.2 pg (ref 26.0–34.0)
MCHC: 35.4 g/dL (ref 30.0–36.0)
MCV: 88.1 fL (ref 78.0–100.0)
MONOS PCT: 12 %
Monocytes Absolute: 0.6 10*3/uL (ref 0.1–1.0)
Neutro Abs: 2.8 10*3/uL (ref 1.7–7.7)
Neutrophils Relative %: 55 %
PLATELETS: 196 10*3/uL (ref 150–400)
RBC: 4.46 MIL/uL (ref 3.87–5.11)
RDW: 13.8 % (ref 11.5–15.5)
WBC: 5.1 10*3/uL (ref 4.0–10.5)

## 2016-03-03 LAB — BASIC METABOLIC PANEL
Anion gap: 7 (ref 5–15)
BUN: 10 mg/dL (ref 6–20)
CHLORIDE: 106 mmol/L (ref 101–111)
CO2: 21 mmol/L — AB (ref 22–32)
CREATININE: 0.74 mg/dL (ref 0.44–1.00)
Calcium: 8.6 mg/dL — ABNORMAL LOW (ref 8.9–10.3)
GFR calc Af Amer: >60 mL/min (ref >60)
GFR calc non Af Amer: >60 mL/min (ref >60)
GLUCOSE: 104 mg/dL — AB (ref 65–99)
POTASSIUM: 3.7 mmol/L (ref 3.5–5.1)
Sodium: 134 mmol/L — ABNORMAL LOW (ref 135–145)

## 2016-03-03 LAB — URINALYSIS, ROUTINE W REFLEX MICROSCOPIC
BILIRUBIN URINE: NEGATIVE
GLUCOSE, UA: NEGATIVE mg/dL
Hgb urine dipstick: NEGATIVE
Ketones, ur: 5 mg/dL — AB
Leukocytes, UA: NEGATIVE
Nitrite: NEGATIVE
PROTEIN: NEGATIVE mg/dL
SPECIFIC GRAVITY, URINE: 1.01 (ref 1.005–1.030)
pH: 6 (ref 5.0–8.0)

## 2016-03-03 MED ORDER — IPRATROPIUM-ALBUTEROL 0.5-2.5 (3) MG/3ML IN SOLN
3.0000 mL | Freq: Once | RESPIRATORY_TRACT | Status: AC
  Administered 2016-03-03: 3 mL via RESPIRATORY_TRACT
  Filled 2016-03-03: qty 3

## 2016-03-03 MED ORDER — PREDNISONE 50 MG PO TABS
60.0000 mg | ORAL_TABLET | Freq: Once | ORAL | Status: AC
  Administered 2016-03-03: 60 mg via ORAL
  Filled 2016-03-03: qty 1

## 2016-03-03 MED ORDER — PREDNISONE 50 MG PO TABS: 50.0000 mg | ORAL_TABLET | Freq: Every day | ORAL | 0 refills | Status: DC

## 2016-03-03 MED ORDER — ALBUTEROL SULFATE HFA 108 (90 BASE) MCG/ACT IN AERS: 2.0000 | INHALATION_SPRAY | RESPIRATORY_TRACT | 0 refills | Status: DC | PRN

## 2016-03-03 MED ORDER — SODIUM CHLORIDE 0.9 % IV BOLUS (SEPSIS)
1000.0000 mL | Freq: Once | INTRAVENOUS | Status: AC
  Administered 2016-03-03: 1000 mL via INTRAVENOUS

## 2016-03-03 NOTE — ED Triage Notes (Signed)
Pt states she was dx with the flu A yesterday and tonight pt states when she was lying in bed she became sob

## 2016-03-03 NOTE — ED Provider Notes (Signed)
[facility] DEPT Provider Note   CSN: [id number] Arrival date & time: 03/03/16  0011  By signing my name below, I, Jackie Smith, attest that this documentation has been prepared under the direction and in the presence of Delora Fuel, MD. Electronically Signed: Collene Smith, Scribe. 03/03/16. 1:01 AM.  History   Chief Complaint Chief Complaint  Patient presents with  . Shortness of Breath    HPI Comments: Jackie Smith is a 31 y.o. female who presents to the Emergency Department complaining of sudden-onset, constant shortness of breath that began two days ago. Patient states she was seen by her PCP yesterday and diagnosed with the flu, referred her to the ED if breathing becomes worse. Patient has associated central chest pain, poor appetite, cough with blood streaks and yellow sputum, congestion, diarrhea, and chills. Patient states she has been gasping for air. Patient describes her chest pain as an 8/10. Patient reports being placed on a z-pak with minimal relief. Patient denies any headache, nausea, vomiting, or any other symptoms.   The history is provided by the patient. No language interpreter was used.    Past Medical History:  Diagnosis Date  . Acid reflux   . Bipolar 1 disorder ([place])   . Frequent UTI   . Gastritis   . History of benign breast tumor   . Infections of kidney   . Irritable bowel syndrome (IBS)   . Thyroid disease     Patient Active Problem List   Diagnosis Date Noted  . Major depression 05/15/2014    Past Surgical History:  Procedure Laterality Date  . c sections    . DILATION AND CURETTAGE OF UTERUS    . INDUCED ABORTION    . LASER ABLATION OF THE CERVIX    . TUBAL LIGATION      OB History    No data available       Home Medications    Prior to Admission medications   Medication Sig Start Date End Date Taking? Authorizing Provider  albuterol (PROVENTIL HFA;VENTOLIN HFA) 108 (90 Base) MCG/ACT inhaler Inhale 2 puffs into the  lungs every 4 (four) hours as needed for wheezing or shortness of breath. Patient not taking: Reported on 11/05/2015 07/09/15   Jackie Napoleon, NP  ALPRAZolam Duanne Moron) 1 MG tablet Take 1 tablet (1 mg total) by mouth 3 (three) times daily. 05/15/14   Jackie Spring, MD  azithromycin (ZITHROMAX Z-PAK) 250 MG tablet 2 po day one, then 1 daily x 4 days Patient not taking: Reported on 11/05/2015 07/11/15   Julianne Rice, MD  benzonatate (TESSALON) 100 MG capsule Take 1 capsule (100 mg total) by mouth every 8 (eight) hours. Patient not taking: Reported on 11/05/2015 12/12/14   Jackie Etienne, DO  desvenlafaxine (PRISTIQ) 100 MG 24 hr tablet Take 1 tablet (100 mg total) by mouth daily. Patient not taking: Reported on 11/05/2015 05/15/14   Jackie Spring, MD  levothyroxine (SYNTHROID, LEVOTHROID) 25 MCG tablet Take 1 tablet (25 mcg total) by mouth daily before breakfast. 11/05/15   Jackie Fischer, MD  omeprazole (PRILOSEC) 20 MG capsule Take 1 capsule (20 mg total) by mouth 2 (two) times daily before a meal. 09/14/15   Jackie Lyn Mackuen, MD  pantoprazole (PROTONIX) 20 MG tablet Take 1 tablet (20 mg total) by mouth as needed. Patient not taking: Reported on 11/05/2015 07/11/15   Julianne Rice, MD  predniSONE (DELTASONE) 20 MG tablet Take 3 tabs po on first day, 2 tabs second day, 2  tabs third day, 1 tab fourth day, 1 tab 5th day. Take with food. Patient not taking: Reported on 11/05/2015 07/09/15   Jackie Napoleon, NP  sertraline (ZOLOFT) 100 MG tablet Take 100 mg by mouth 2 (two) times daily.     Historical Provider, MD  sucralfate (CARAFATE) 1 GM/10ML suspension Take 10 mLs (1 g total) by mouth 4 (four) times daily -  with meals and at bedtime. Patient not taking: Reported on 11/05/2015 09/14/15   Jackie Lyn Mackuen, MD  zolpidem (AMBIEN CR) 12.5 MG CR tablet Take 12.5 mg by mouth at bedtime as needed for sleep.    Historical Provider, MD    Family History Family History  Problem Relation Age of Onset  . Drug  abuse Mother   . Bipolar disorder Father   . Drug abuse Father   . Heart disease    . Arthritis    . Cancer    . Asthma    . Diabetes    . Depression Sister   . Anxiety disorder Sister   . Bipolar disorder Paternal Uncle     Social History Social History  Substance Use Topics  . Smoking status: Former Smoker    Packs/day: 0.25  . Smokeless tobacco: Never Used  . Alcohol use No     Allergies   Imitrex [sumatriptan base]   Review of Systems Review of Systems  Constitutional: Positive for chills. Negative for fever.  Respiratory: Positive for cough and wheezing.   Cardiovascular: Positive for chest pain.  Gastrointestinal: Positive for diarrhea. Negative for nausea and vomiting.  All other systems reviewed and are negative.    Physical Exam Updated Vital Signs BP 117/77 (BP Location: Left Arm)   Pulse 103   Temp 98.6 F (37 C) (Oral)   Resp 20   Ht 5\' 4"  (1.626 m)   Wt 220 lb (99.8 kg)   SpO2 98%   BMI 37.76 kg/m   Physical Exam  Constitutional: She is oriented to person, place, and time. She appears well-developed and well-nourished.  HENT:  Head: Normocephalic and atraumatic.  Mild tenderness to palpation of frontal and maxillary sinuses.   Eyes: EOM are normal. Pupils are equal, round, and reactive to light.  Neck: Normal range of motion. Neck supple. No JVD present.  Cardiovascular: Normal rate, regular rhythm and normal heart sounds.   No murmur heard. Pulmonary/Chest: Effort normal. She has wheezes. She has no rales. She exhibits no tenderness.  Course breath sound with diffuse expiratory wheezes.   Abdominal: Soft. Bowel sounds are normal. She exhibits no distension and no mass. There is no tenderness.  Musculoskeletal: Normal range of motion. She exhibits no edema.  Lymphadenopathy:    She has no cervical adenopathy.  Neurological: She is alert and oriented to person, place, and time. No cranial nerve deficit. She exhibits normal muscle tone.  Coordination normal.  Skin: Skin is warm and dry. No rash noted.  Psychiatric: She has a normal mood and affect. Her behavior is normal. Judgment and thought content normal.  Nursing note and vitals reviewed.    ED Treatments / Results  DIAGNOSTIC STUDIES: Oxygen Saturation is 98% on RA, normal by my interpretation.    COORDINATION OF CARE: 1:00 AM Discussed treatment plan with pt at bedside and pt agreed to plan, which includes a breathing treatment, antibiotics, blood work, and a chest x-ray.   Labs (all labs ordered are listed, but only abnormal results are displayed) Labs Reviewed  BASIC METABOLIC PANEL - Abnormal;  Notable for the following:       Result Value   Sodium 134 (*)    CO2 21 (*)    Glucose, Bld 104 (*)    Calcium 8.6 (*)    All other components within normal limits  URINALYSIS, ROUTINE W REFLEX MICROSCOPIC - Abnormal; Notable for the following:    APPearance CLOUDY (*)    Ketones, ur 5 (*)    All other components within normal limits  CBC WITH DIFFERENTIAL/PLATELET    Radiology Dg Chest 2 View  Result Date: 03/03/2016 CLINICAL DATA:  Increasing shortness of breath tonight. Generalized chest pain, fever, and shortness of breath for 3 days. Diagnosed with flu yesterday. EXAM: CHEST  2 VIEW COMPARISON:  01/13/2016 FINDINGS: The heart size and mediastinal contours are within normal limits. Both lungs are clear. The visualized skeletal structures are unremarkable. IMPRESSION: No active cardiopulmonary disease. Electronically Signed   By: Lucienne Capers M.D.   On: 03/03/2016 01:22    Procedures Procedures (including critical care time)  Medications Ordered in ED Medications  ipratropium-albuterol (DUONEB) 0.5-2.5 (3) MG/3ML nebulizer solution 3 mL (3 mLs Nebulization Given 03/03/16 0130)  sodium chloride 0.9 % bolus 1,000 mL (0 mLs Intravenous Stopped 03/03/16 0224)  predniSONE (DELTASONE) tablet 60 mg (60 mg Oral Given 03/03/16 0158)  ipratropium-albuterol  (DUONEB) 0.5-2.5 (3) MG/3ML nebulizer solution 3 mL (3 mLs Nebulization Given 03/03/16 0200)     Initial Impression / Assessment and Plan / ED Course  I have reviewed the triage vital signs and the nursing notes.  Pertinent labs & imaging results that were available during my care of the patient were reviewed by me and considered in my medical decision making (see chart for details).  Influenza A with significant wheezing. She is already on an antibiotic (azithromycin. Old records are reviewed, and she does have prior ED visit for bronchospasm. She is sent for chest x-ray to rule out pneumonia and this is negative and she is concerned about dehydration so electrolytes and urinalysis checked which are unremarkable. She is given IV fluids and oral prednisone and nebulized albuterol with ipratropium. Following this, she states that she feels significantly better but not back to baseline. On reexam, I hear no evidence of bronchospasm, but she is given a second nebulizer treatment with further subjective improvement. She is discharged with prescriptions for inhaler and for prednisone. She is to continue taking her antibiotic until it is completed. Soft with PCP as needed.  Final Clinical Impressions(s) / ED Diagnoses   Final diagnoses:  Bronchospasm with bronchitis, acute  Influenza A    New Prescriptions New Prescriptions   PREDNISONE (DELTASONE) 50 MG TABLET    Take 1 tablet (50 mg total) by mouth daily.   I personally performed the services described in this documentation, which was scribed in my presence. The recorded information has been reviewed and is accurate.       Delora Fuel, MD [id number]

## 2016-04-06 DIAGNOSIS — E559 Vitamin D deficiency, unspecified: Secondary | ICD-10-CM | POA: Diagnosis not present

## 2016-04-06 DIAGNOSIS — K219 Gastro-esophageal reflux disease without esophagitis: Secondary | ICD-10-CM | POA: Diagnosis not present

## 2016-04-06 DIAGNOSIS — E039 Hypothyroidism, unspecified: Secondary | ICD-10-CM | POA: Diagnosis not present

## 2016-04-27 DIAGNOSIS — F331 Major depressive disorder, recurrent, moderate: Secondary | ICD-10-CM | POA: Diagnosis not present

## 2016-04-27 DIAGNOSIS — F3289 Other specified depressive episodes: Secondary | ICD-10-CM | POA: Diagnosis not present

## 2016-04-27 DIAGNOSIS — F411 Generalized anxiety disorder: Secondary | ICD-10-CM | POA: Diagnosis not present

## 2016-06-24 ENCOUNTER — Encounter (HOSPITAL_COMMUNITY): Payer: Self-pay | Admitting: *Deleted

## 2016-06-24 ENCOUNTER — Emergency Department (HOSPITAL_COMMUNITY): Payer: BLUE CROSS/BLUE SHIELD

## 2016-06-24 ENCOUNTER — Emergency Department (HOSPITAL_COMMUNITY)
Admission: EM | Admit: 2016-06-24 | Discharge: 2016-06-25 | Disposition: A | Discharge status: Home / Self Care | Payer: BLUE CROSS/BLUE SHIELD | Attending: Emergency Medicine | Admitting: Emergency Medicine

## 2016-06-24 DIAGNOSIS — R079 Chest pain, unspecified: Secondary | ICD-10-CM | POA: Diagnosis not present

## 2016-06-24 DIAGNOSIS — R0789 Other chest pain: Secondary | ICD-10-CM | POA: Diagnosis not present

## 2016-06-24 DIAGNOSIS — K219 Gastro-esophageal reflux disease without esophagitis: Secondary | ICD-10-CM | POA: Diagnosis not present

## 2016-06-24 DIAGNOSIS — Z87891 Personal history of nicotine dependence: Secondary | ICD-10-CM | POA: Insufficient documentation

## 2016-06-24 DIAGNOSIS — R05 Cough: Secondary | ICD-10-CM | POA: Diagnosis not present

## 2016-06-24 DIAGNOSIS — Z79899 Other long term (current) drug therapy: Secondary | ICD-10-CM | POA: Diagnosis not present

## 2016-06-24 DIAGNOSIS — R0602 Shortness of breath: Secondary | ICD-10-CM | POA: Diagnosis not present

## 2016-06-24 LAB — CBC WITH DIFFERENTIAL/PLATELET
Basophils Absolute: 0 10*3/uL (ref 0.0–0.1)
Basophils Relative: 0 %
EOS ABS: 0.3 10*3/uL (ref 0.0–0.7)
EOS PCT: 3 %
HCT: 43.8 % (ref 36.0–46.0)
Hemoglobin: 15.3 g/dL — ABNORMAL HIGH (ref 12.0–15.0)
LYMPHS ABS: 2.4 10*3/uL (ref 0.7–4.0)
LYMPHS PCT: 24 %
MCH: 31 pg (ref 26.0–34.0)
MCHC: 34.9 g/dL (ref 30.0–36.0)
MCV: 88.7 fL (ref 78.0–100.0)
MONO ABS: 0.9 10*3/uL (ref 0.1–1.0)
Monocytes Relative: 8 %
Neutro Abs: 6.6 10*3/uL (ref 1.7–7.7)
Neutrophils Relative %: 65 %
PLATELETS: 194 10*3/uL (ref 150–400)
RBC: 4.94 MIL/uL (ref 3.87–5.11)
RDW: 13.5 % (ref 11.5–15.5)
WBC: 10.2 10*3/uL (ref 4.0–10.5)

## 2016-06-24 LAB — D-DIMER, QUANTITATIVE: D-Dimer, Quant: 0.33 ug/mL-FEU (ref 0.00–0.50)

## 2016-06-24 LAB — BASIC METABOLIC PANEL
Anion gap: 10 (ref 5–15)
BUN: 10 mg/dL (ref 6–20)
CALCIUM: 9.7 mg/dL (ref 8.9–10.3)
CO2: 25 mmol/L (ref 22–32)
CREATININE: 0.82 mg/dL (ref 0.44–1.00)
Chloride: 104 mmol/L (ref 101–111)
GFR calc Af Amer: >60 mL/min (ref >60)
GLUCOSE: 120 mg/dL — AB (ref 65–99)
Potassium: 3.4 mmol/L — ABNORMAL LOW (ref 3.5–5.1)
Sodium: 139 mmol/L (ref 135–145)

## 2016-06-24 LAB — TROPONIN I: Troponin I: <0.03 ng/mL (ref <0.03)

## 2016-06-24 MED ORDER — FAMOTIDINE 20 MG PO TABS: 20.0000 mg | ORAL_TABLET | Freq: Two times a day (BID) | ORAL | 0 refills | Status: DC | PRN

## 2016-06-24 MED ORDER — GI COCKTAIL ~~LOC~~
30.0000 mL | Freq: Once | ORAL | Status: AC
  Administered 2016-06-24: 30 mL via ORAL
  Filled 2016-06-24: qty 30

## 2016-06-24 NOTE — ED Provider Notes (Signed)
[facility] DEPT Provider Note   CSN: [id number] Arrival date & time: 06/24/16  2041     History   Chief Complaint Chief Complaint  Patient presents with  . Shortness of Breath    HPI Jackie Smith is a 31 y.o. female presenting with chest pain, shortness of breath associated with lightheadedness, numbness and tingling in her fingers and toes along with worsened acid reflux symptoms.  She describes worsened intermittent mid sternal burning she recognizes as her reflux which responds to prn Tums, but has had a more constant, aching left chest pain present for the past several days which started gradually and has been associated with intermittent episodes of shortness of breath which she describes as hyperventilating.  She endorses increased personal stressors and has a history of panic episodes but does not describe a typical full blown panic attack.  She has found no alleviators for the constant pain which does not radiate. Denies cough, fevers, palpitations, wheezing, extremity pain or swelling. Her father has afib so she is worried about that possibility.  HPI  Past Medical History:  Diagnosis Date  . Acid reflux   . Bipolar 1 disorder ([place])   . Frequent UTI   . Gastritis   . History of benign breast tumor   . Infections of kidney   . Irritable bowel syndrome (IBS)   . Thyroid disease     Patient Active Problem List   Diagnosis Date Noted  . Major depression 05/15/2014    Past Surgical History:  Procedure Laterality Date  . c sections    . DILATION AND CURETTAGE OF UTERUS    . INDUCED ABORTION    . LASER ABLATION OF THE CERVIX    . TUBAL LIGATION      OB History    No data available       Home Medications    Prior to Admission medications   Medication Sig Start Date End Date Taking? Authorizing Provider  albuterol (PROVENTIL HFA;VENTOLIN HFA) 108 (90 Base) MCG/ACT inhaler Inhale 2 puffs into the lungs every 4 (four) hours as needed for wheezing or  shortness of breath (or coughing). 7/34/19  Yes Delora Fuel, MD  ALPRAZolam Duanne Moron) 1 MG tablet Take 1 mg by mouth 3 (three) times daily.   Yes [provider]  Cholecalciferol (VITAMIN D3) 5000 units CAPS Take 1 capsule by mouth daily.   Yes [provider]  levothyroxine (SYNTHROID, LEVOTHROID) 25 MCG tablet Take 1 tablet (25 mcg total) by mouth daily before breakfast. Patient taking differently: Take 50 mcg by mouth daily before breakfast.  11/05/15  Yes Kindl, Nelda Severe, MD  zolpidem (AMBIEN) 10 MG tablet Take 10 mg by mouth at bedtime. 01/28/16  Yes [provider]  famotidine (PEPCID) 20 MG tablet Take 1 tablet (20 mg total) by mouth 2 (two) times daily as needed for heartburn or indigestion. 06/24/16   Evalee Jefferson, PA-C    Family History Family History  Problem Relation Age of Onset  . Drug abuse Mother   . Bipolar disorder Father   . Drug abuse Father   . Heart disease Unknown   . Arthritis Unknown   . Cancer Unknown   . Asthma Unknown   . Diabetes Unknown   . Depression Sister   . Anxiety disorder Sister   . Bipolar disorder Paternal Uncle     Social History Social History  Substance Use Topics  . Smoking status: Former Smoker    Packs/day: 0.25  . Smokeless  tobacco: Never Used  . Alcohol use No     Allergies   Imitrex [sumatriptan base]   Review of Systems Review of Systems  Constitutional: Negative for chills and fever.  HENT: Negative for congestion and sore throat.   Eyes: Negative.   Respiratory: Positive for shortness of breath. Negative for chest tightness and wheezing.   Cardiovascular: Positive for chest pain. Negative for palpitations and leg swelling.  Gastrointestinal: Negative for abdominal pain and nausea.  Genitourinary: Negative.   Musculoskeletal: Negative for arthralgias, joint swelling and neck pain.  Skin: Negative.  Negative for rash and wound.  Neurological: Negative for dizziness, weakness, light-headedness,  numbness and headaches.  Psychiatric/Behavioral: Negative.      Physical Exam Updated Vital Signs BP (!) 154/68 (BP Location: Left Arm)   Pulse 87   Temp 98 F (36.7 C) (Oral)   Resp (!) 28   Ht 5\' 2"  (1.575 m)   SpO2 99%   Physical Exam  Constitutional: She appears well-developed and well-nourished.  HENT:  Head: Normocephalic and atraumatic.  Eyes: Conjunctivae are normal.  Neck: Normal range of motion.  Cardiovascular: Normal rate, regular rhythm, normal heart sounds and intact distal pulses.   No murmur heard. Pulses:      Radial pulses are 2+ on the right side, and 2+ on the left side.       Dorsalis pedis pulses are 2+ on the right side, and 2+ on the left side.  Pulmonary/Chest: Effort normal and breath sounds normal. She has no wheezes. She exhibits no tenderness.  Abdominal: Soft. Bowel sounds are normal. There is no tenderness.  Musculoskeletal: Normal range of motion. She exhibits no edema.  Neurological: She is alert.  Skin: Skin is warm and dry.  Psychiatric: She has a normal mood and affect.  Nursing note and vitals reviewed.    ED Treatments / Results  Labs (all labs ordered are listed, but only abnormal results are displayed)  Results for orders placed or performed during the hospital encounter of 06/24/16  CBC with Differential  Result Value Ref Range   WBC 10.2 4.0 - 10.5 K/uL   RBC 4.94 3.87 - 5.11 MIL/uL   Hemoglobin 15.3 (H) 12.0 - 15.0 g/dL   HCT 43.8 36.0 - 46.0 %   MCV 88.7 78.0 - 100.0 fL   MCH 31.0 26.0 - 34.0 pg   MCHC 34.9 30.0 - 36.0 g/dL   RDW 13.5 11.5 - 15.5 %   Platelets 194 150 - 400 K/uL   Neutrophils Relative % 65 %   Neutro Abs 6.6 1.7 - 7.7 K/uL   Lymphocytes Relative 24 %   Lymphs Abs 2.4 0.7 - 4.0 K/uL   Monocytes Relative 8 %   Monocytes Absolute 0.9 0.1 - 1.0 K/uL   Eosinophils Relative 3 %   Eosinophils Absolute 0.3 0.0 - 0.7 K/uL   Basophils Relative 0 %   Basophils Absolute 0.0 0.0 - 0.1 K/uL  Basic metabolic  panel  Result Value Ref Range   Sodium 139 135 - 145 mmol/L   Potassium 3.4 (L) 3.5 - 5.1 mmol/L   Chloride 104 101 - 111 mmol/L   CO2 25 22 - 32 mmol/L   Glucose, Bld 120 (H) 65 - 99 mg/dL   BUN 10 6 - 20 mg/dL   Creatinine, Ser 0.82 0.44 - 1.00 mg/dL   Calcium 9.7 8.9 - 10.3 mg/dL   GFR calc non Af Amer >60 >60 mL/min   GFR calc Af Amer >60 >  60 mL/min   Anion gap 10 5 - 15  Troponin I  Result Value Ref Range   Troponin I <0.03 <0.03 ng/mL  D-dimer, quantitative (not at [facility])  Result Value Ref Range   D-Dimer, Quant 0.33 0.00 - 0.50 ug/mL-FEU   Dg Chest 2 View  Result Date: 06/24/2016 CLINICAL DATA:  Nonproductive cough for 3 days. Chest pain. Shortness of breath tonight. EXAM: CHEST  2 VIEW COMPARISON:  03/03/2016 FINDINGS: The cardiomediastinal contours are normal. The lungs are clear. Pulmonary vasculature is normal. No consolidation, pleural effusion, or pneumothorax. No acute osseous abnormalities are seen. IMPRESSION: No acute abnormality. Electronically Signed   By: Jeb Levering M.D.   On: 06/24/2016 22:34     EKG  EKG Interpretation  Date/Time:  Friday June 24 2016 20:58:08 EDT Ventricular Rate:  99 PR Interval:  128 QRS Duration: 74 QT Interval:  350 QTC Calculation: 449 R Axis:   71 Text Interpretation:  Normal sinus rhythm Low voltage QRS Borderline ECG Confirmed by Nat Christen [phone]) on 06/24/2016 10:05:22 PM       Radiology Dg Chest 2 View  Result Date: 06/24/2016 CLINICAL DATA:  Nonproductive cough for 3 days. Chest pain. Shortness of breath tonight. EXAM: CHEST  2 VIEW COMPARISON:  03/03/2016 FINDINGS: The cardiomediastinal contours are normal. The lungs are clear. Pulmonary vasculature is normal. No consolidation, pleural effusion, or pneumothorax. No acute osseous abnormalities are seen. IMPRESSION: No acute abnormality. Electronically Signed   By: Jeb Levering M.D.   On: 06/24/2016 22:34    Procedures Procedures (including critical care  time)  Medications Ordered in ED Medications  gi cocktail (Maalox,Lidocaine,Donnatal) (30 mLs Oral Given 06/24/16 2230)     Initial Impression / Assessment and Plan / ED Course  I have reviewed the triage vital signs and the nursing notes.  Pertinent labs & imaging results that were available during my care of the patient were reviewed by me and considered in my medical decision making (see chart for details).     Labs, ekg and cxr reviewed and discussed with patient. Pain improved at time of dc.  Suspect anxiety as driving component as most of her concerns. Advised increased gerd tx to pepcid bid and f/u with pcp for recheck if sx persist.  No sig cardiac risk factors. Perc negative with neg d dimer, ruling out PE.    Discussed with Dr. Lacinda Axon prior to dc home.  Final Clinical Impressions(s) / ED Diagnoses   Final diagnoses:  Other chest pain  Gastroesophageal reflux disease, esophagitis presence not specified    New Prescriptions New Prescriptions   FAMOTIDINE (PEPCID) 20 MG TABLET    Take 1 tablet (20 mg total) by mouth 2 (two) times daily as needed for heartburn or indigestion.     Evalee Jefferson, PA-C 06/25/16 1411    Nat Christen, MD 06/25/16 [phone]

## 2016-06-24 NOTE — ED Notes (Signed)
Pt reports a history of panic attacks of which she takes xanax tid- she reports chest pain for the last 3 days - reports that her father has a hx of a fib  Has taken no medication for pain  Has no called Dr Reola Mosher

## 2016-06-24 NOTE — ED Triage Notes (Signed)
Chest pain with shortness of breath ?

## 2016-06-24 NOTE — Discharge Instructions (Signed)
You may want to consider pepcid twice daily which can help with acid reflux.  As discussed, your labs, ekg and xrays are normal.  Your symptoms may be stress related, but if they persist, I encourage you to follow up with your primary doctor for a recheck of your symptoms.

## 2016-06-24 NOTE — ED Notes (Signed)
Pt states she "cannot breathe". O2 sats on room air=100%. Pt has good air exchange and is able to talk without any distress noted.

## 2016-07-04 DIAGNOSIS — Z713 Dietary counseling and surveillance: Secondary | ICD-10-CM | POA: Diagnosis not present

## 2016-07-04 DIAGNOSIS — K219 Gastro-esophageal reflux disease without esophagitis: Secondary | ICD-10-CM | POA: Diagnosis not present

## 2016-07-04 DIAGNOSIS — G4700 Insomnia, unspecified: Secondary | ICD-10-CM | POA: Diagnosis not present

## 2016-07-04 DIAGNOSIS — E039 Hypothyroidism, unspecified: Secondary | ICD-10-CM | POA: Diagnosis not present

## 2016-07-04 DIAGNOSIS — E559 Vitamin D deficiency, unspecified: Secondary | ICD-10-CM | POA: Diagnosis not present

## 2016-07-11 DIAGNOSIS — F411 Generalized anxiety disorder: Secondary | ICD-10-CM | POA: Diagnosis not present

## 2016-07-11 DIAGNOSIS — F331 Major depressive disorder, recurrent, moderate: Secondary | ICD-10-CM | POA: Diagnosis not present

## 2016-07-11 DIAGNOSIS — F410 Panic disorder [episodic paroxysmal anxiety] without agoraphobia: Secondary | ICD-10-CM | POA: Diagnosis not present

## 2016-08-10 DIAGNOSIS — F411 Generalized anxiety disorder: Secondary | ICD-10-CM | POA: Diagnosis not present

## 2016-08-10 DIAGNOSIS — F331 Major depressive disorder, recurrent, moderate: Secondary | ICD-10-CM | POA: Diagnosis not present

## 2016-08-10 DIAGNOSIS — F410 Panic disorder [episodic paroxysmal anxiety] without agoraphobia: Secondary | ICD-10-CM | POA: Diagnosis not present

## 2016-08-10 DIAGNOSIS — F439 Reaction to severe stress, unspecified: Secondary | ICD-10-CM | POA: Diagnosis not present

## 2016-10-19 DIAGNOSIS — F331 Major depressive disorder, recurrent, moderate: Secondary | ICD-10-CM | POA: Diagnosis not present

## 2016-11-01 DIAGNOSIS — F331 Major depressive disorder, recurrent, moderate: Secondary | ICD-10-CM | POA: Diagnosis not present

## 2016-11-17 DIAGNOSIS — F331 Major depressive disorder, recurrent, moderate: Secondary | ICD-10-CM | POA: Diagnosis not present

## 2016-12-05 DIAGNOSIS — F331 Major depressive disorder, recurrent, moderate: Secondary | ICD-10-CM | POA: Diagnosis not present

## 2017-01-31 DIAGNOSIS — G479 Sleep disorder, unspecified: Secondary | ICD-10-CM | POA: Diagnosis not present

## 2017-01-31 DIAGNOSIS — F411 Generalized anxiety disorder: Secondary | ICD-10-CM | POA: Diagnosis not present

## 2017-01-31 DIAGNOSIS — F331 Major depressive disorder, recurrent, moderate: Secondary | ICD-10-CM | POA: Diagnosis not present

## 2017-03-31 ENCOUNTER — Emergency Department (HOSPITAL_COMMUNITY)
Admission: EM | Admit: 2017-03-31 | Discharge: 2017-03-31 | Disposition: A | Discharge status: Home / Self Care | Payer: Medicaid Other | Attending: Emergency Medicine | Admitting: Emergency Medicine

## 2017-03-31 ENCOUNTER — Emergency Department (HOSPITAL_COMMUNITY): Payer: Medicaid Other

## 2017-03-31 ENCOUNTER — Other Ambulatory Visit: Payer: Self-pay

## 2017-03-31 ENCOUNTER — Encounter (HOSPITAL_COMMUNITY): Payer: Self-pay

## 2017-03-31 DIAGNOSIS — Z87891 Personal history of nicotine dependence: Secondary | ICD-10-CM | POA: Diagnosis not present

## 2017-03-31 DIAGNOSIS — Z3202 Encounter for pregnancy test, result negative: Secondary | ICD-10-CM | POA: Diagnosis not present

## 2017-03-31 DIAGNOSIS — R1030 Lower abdominal pain, unspecified: Secondary | ICD-10-CM | POA: Diagnosis present

## 2017-03-31 DIAGNOSIS — Z79899 Other long term (current) drug therapy: Secondary | ICD-10-CM | POA: Insufficient documentation

## 2017-03-31 DIAGNOSIS — R102 Pelvic and perineal pain: Secondary | ICD-10-CM

## 2017-03-31 LAB — CBC WITH DIFFERENTIAL/PLATELET
BASOS PCT: 0 %
Basophils Absolute: 0 10*3/uL (ref 0.0–0.1)
Eosinophils Absolute: 0.1 10*3/uL (ref 0.0–0.7)
Eosinophils Relative: 1 %
HEMATOCRIT: 41.3 % (ref 36.0–46.0)
Hemoglobin: 13.5 g/dL (ref 12.0–15.0)
Lymphocytes Relative: 25 %
Lymphs Abs: 1.6 10*3/uL (ref 0.7–4.0)
MCH: 30.2 pg (ref 26.0–34.0)
MCHC: 32.7 g/dL (ref 30.0–36.0)
MCV: 92.4 fL (ref 78.0–100.0)
MONOS PCT: 6 %
Monocytes Absolute: 0.4 10*3/uL (ref 0.1–1.0)
NEUTROS ABS: 4.4 10*3/uL (ref 1.7–7.7)
Neutrophils Relative %: 68 %
Platelets: 203 10*3/uL (ref 150–400)
RBC: 4.47 MIL/uL (ref 3.87–5.11)
RDW: 14.2 % (ref 11.5–15.5)
WBC: 6.5 10*3/uL (ref 4.0–10.5)

## 2017-03-31 LAB — COMPREHENSIVE METABOLIC PANEL
ALK PHOS: 68 U/L (ref 38–126)
ALT: 33 U/L (ref 14–54)
AST: 25 U/L (ref 15–41)
Albumin: 4.2 g/dL (ref 3.5–5.0)
Anion gap: 12 (ref 5–15)
BUN: 11 mg/dL (ref 6–20)
CALCIUM: 9.8 mg/dL (ref 8.9–10.3)
CHLORIDE: 103 mmol/L (ref 101–111)
CO2: 25 mmol/L (ref 22–32)
Creatinine, Ser: 0.83 mg/dL (ref 0.44–1.00)
Glucose, Bld: 144 mg/dL — ABNORMAL HIGH (ref 65–99)
Potassium: 3.4 mmol/L — ABNORMAL LOW (ref 3.5–5.1)
SODIUM: 140 mmol/L (ref 135–145)
Total Bilirubin: 0.7 mg/dL (ref 0.3–1.2)
Total Protein: 7.5 g/dL (ref 6.5–8.1)

## 2017-03-31 LAB — URINALYSIS, ROUTINE W REFLEX MICROSCOPIC
BILIRUBIN URINE: NEGATIVE
Glucose, UA: NEGATIVE mg/dL
Hgb urine dipstick: NEGATIVE
KETONES UR: NEGATIVE mg/dL
Leukocytes, UA: NEGATIVE
NITRITE: NEGATIVE
Protein, ur: NEGATIVE mg/dL
Specific Gravity, Urine: 1.019 (ref 1.005–1.030)
pH: 6 (ref 5.0–8.0)

## 2017-03-31 LAB — POC URINE PREG, ED: PREG TEST UR: NEGATIVE

## 2017-03-31 MED ORDER — MORPHINE SULFATE (PF) 4 MG/ML IV SOLN
4.0000 mg | Freq: Once | INTRAVENOUS | Status: AC
  Administered 2017-03-31: 4 mg via INTRAVENOUS
  Filled 2017-03-31: qty 1

## 2017-03-31 MED ORDER — IOPAMIDOL (ISOVUE-300) INJECTION 61%
100.0000 mL | Freq: Once | INTRAVENOUS | Status: AC | PRN
  Administered 2017-03-31: 100 mL via INTRAVENOUS

## 2017-03-31 MED ORDER — SODIUM CHLORIDE 0.9 % IV BOLUS (SEPSIS)
1000.0000 mL | Freq: Once | INTRAVENOUS | Status: AC
  Administered 2017-03-31: 1000 mL via INTRAVENOUS

## 2017-03-31 MED ORDER — HYDROCODONE-ACETAMINOPHEN 5-325 MG PO TABS: 1.0000 | ORAL_TABLET | Freq: Four times a day (QID) | ORAL | 0 refills | Status: DC | PRN

## 2017-03-31 MED ORDER — ONDANSETRON HCL 4 MG/2ML IJ SOLN
4.0000 mg | Freq: Once | INTRAMUSCULAR | Status: AC
  Administered 2017-03-31: 4 mg via INTRAVENOUS
  Filled 2017-03-31: qty 2

## 2017-03-31 NOTE — ED Triage Notes (Signed)
Pt states she just finished cipro for kidney infection, states she has since started having lower abd pain x 3 days, vomiting x 1 week.

## 2017-03-31 NOTE — ED Provider Notes (Signed)
[facility] EMERGENCY DEPARTMENT Provider Note   CSN: [id number] Arrival date & time: 03/31/17  0021     History   Chief Complaint Chief Complaint  Patient presents with  . Abdominal Pain    HPI Jackie Smith is a 32 y.o. female.  Patient is a 32 year old female with past medical history of bipolar, irritable bowel, uterine ablation and D&C.  She presents today for evaluation of lower abdominal pain.  This is been ongoing for the past week.  She denies any vomiting, but has felt "queasy".  She denies any constipation or diarrhea.  She denies any fevers or chills.  She was seen last week by her primary care doctor and diagnosed with a urinary tract infection.  She was started on Cipro, however her dysuria has improved.  She states that she does not have periods since her D&C and she denies any vaginal bleeding or discharge.   The history is provided by the patient.  Abdominal Pain   This is a new problem. Episode onset: 1 week ago. The problem occurs constantly. The problem has been gradually worsening. The pain is located in the RLQ and LLQ. The quality of the pain is cramping. The pain is moderate. Pertinent negatives include fever, flatus, hematochezia, vomiting, constipation and dysuria. Nothing aggravates the symptoms. Nothing relieves the symptoms.    Past Medical History:  Diagnosis Date  . Acid reflux   . Bipolar 1 disorder ([place])   . Frequent UTI   . Gastritis   . History of benign breast tumor   . Infections of kidney   . Irritable bowel syndrome (IBS)   . Thyroid disease     Patient Active Problem List   Diagnosis Date Noted  . Major depression 05/15/2014    Past Surgical History:  Procedure Laterality Date  . c sections    . DILATION AND CURETTAGE OF UTERUS    . INDUCED ABORTION    . LASER ABLATION OF THE CERVIX    . TUBAL LIGATION      OB History    No data available       Home Medications    Prior to Admission medications   Medication  Sig Start Date End Date Taking? Authorizing Provider  ALPRAZolam Duanne Moron) 1 MG tablet Take 1 mg by mouth 3 (three) times daily.   Yes [provider]  ARIPiprazole (ABILIFY) 20 MG tablet Take 20 mg by mouth daily.   Yes [provider]  Cholecalciferol (VITAMIN D3) 5000 units CAPS Take 1 capsule by mouth daily.   Yes [provider]  lamoTRIgine (LAMICTAL) 100 MG tablet Take 100 mg by mouth daily.   Yes [provider]  QUEtiapine (SEROQUEL) 50 MG tablet Take 50 mg by mouth at bedtime.   Yes [provider]  albuterol (PROVENTIL HFA;VENTOLIN HFA) 108 (90 Base) MCG/ACT inhaler Inhale 2 puffs into the lungs every 4 (four) hours as needed for wheezing or shortness of breath (or coughing). 7/49/44   Delora Fuel, MD  famotidine (PEPCID) 20 MG tablet Take 1 tablet (20 mg total) by mouth 2 (two) times daily as needed for heartburn or indigestion. 06/24/16   Evalee Jefferson, PA-C  levothyroxine (SYNTHROID, LEVOTHROID) 25 MCG tablet Take 1 tablet (25 mcg total) by mouth daily before breakfast. Patient taking differently: Take 50 mcg by mouth daily before breakfast.  11/05/15   Billy Fischer, MD  zolpidem (AMBIEN) 10 MG tablet Take 10 mg by mouth at bedtime. 01/28/16  [provider]    Family History Family History  Problem Relation Age of Onset  . Drug abuse Mother   . Bipolar disorder Father   . Drug abuse Father   . Heart disease Unknown   . Arthritis Unknown   . Cancer Unknown   . Asthma Unknown   . Diabetes Unknown   . Depression Sister   . Anxiety disorder Sister   . Bipolar disorder Paternal Uncle     Social History Social History   Tobacco Use  . Smoking status: Former Smoker    Packs/day: 0.25  . Smokeless tobacco: Never Used  Substance Use Topics  . Alcohol use: No  . Drug use: No     Allergies   Imitrex [sumatriptan base]   Review of Systems Review of Systems  Constitutional: Negative for fever.  Gastrointestinal:  Positive for abdominal pain. Negative for constipation, flatus, hematochezia and vomiting.  Genitourinary: Negative for dysuria.  All other systems reviewed and are negative.    Physical Exam Updated Vital Signs BP 108/78 (BP Location: Right Arm)   Pulse (!) 102   Temp 98.7 F (37.1 C) (Oral)   Resp 16   Ht 5\' 4"  (1.626 m)   Wt 108.9 kg (240 lb)   SpO2 98%   BMI 41.20 kg/m   Physical Exam  Constitutional: She is oriented to person, place, and time. She appears well-developed and well-nourished. No distress.  HENT:  Head: Normocephalic and atraumatic.  Neck: Normal range of motion. Neck supple.  Cardiovascular: Normal rate and regular rhythm. Exam reveals no gallop and no friction rub.  No murmur heard. Pulmonary/Chest: Effort normal and breath sounds normal. No respiratory distress. She has no wheezes.  Abdominal: Soft. Bowel sounds are normal. She exhibits no distension. There is tenderness in the right lower quadrant, suprapubic area and left lower quadrant. There is no rigidity, no rebound, no guarding and no CVA tenderness.  There is tenderness to palpation across the lower abdomen.  Musculoskeletal: Normal range of motion.  Neurological: She is alert and oriented to person, place, and time.  Skin: Skin is warm and dry. She is not diaphoretic.  Nursing note and vitals reviewed.    ED Treatments / Results  Labs (all labs ordered are listed, but only abnormal results are displayed) Labs Reviewed  URINALYSIS, ROUTINE W REFLEX MICROSCOPIC - Abnormal; Notable for the following components:      Result Value   Color, Urine AMBER (*)    APPearance CLOUDY (*)    All other components within normal limits  COMPREHENSIVE METABOLIC PANEL  CBC WITH DIFFERENTIAL/PLATELET  POC URINE PREG, ED    EKG  EKG Interpretation None       Radiology No results found.  Procedures Procedures (including critical care time)  Medications Ordered in ED Medications  sodium chloride  0.9 % bolus 1,000 mL (not administered)  ondansetron (ZOFRAN) injection 4 mg (not administered)  morphine 4 MG/ML injection 4 mg (not administered)     Initial Impression / Assessment and Plan / ED Course  I have reviewed the triage vital signs and the nursing notes.  Pertinent labs & imaging results that were available during my care of the patient were reviewed by me and considered in my medical decision making (see chart for details).  Patient presenting with lower abdominal pain, worsening over the past several days.  Her laboratory studies are reassuring, urinalysis is clear, and CT scan shows no evidence for acute intra-abdominal process.  I am  uncertain as to the exact etiology of her discomfort, however nothing appears emergent.  At this point, I feel as though she is appropriate for discharge.  She will be prescribed a small quantity of pain medication which she can take as needed.  She is to return as needed if her symptoms worsen or change.  Final Clinical Impressions(s) / ED Diagnoses   Final diagnoses:  None    ED Discharge Orders    None       Veryl Speak, MD 03/31/17 [phone]

## 2017-03-31 NOTE — Discharge Instructions (Signed)
Hydrocodone is prescribed as needed for pain.  Follow up with your primary doctor if symptoms are not improving in the next 2-3 days, and return to the emergency department if your symptoms significantly change or worsen in the meantime.

## 2017-05-10 DIAGNOSIS — J111 Influenza due to unidentified influenza virus with other respiratory manifestations: Secondary | ICD-10-CM | POA: Diagnosis not present

## 2017-05-10 DIAGNOSIS — N390 Urinary tract infection, site not specified: Secondary | ICD-10-CM | POA: Diagnosis not present

## 2017-05-10 DIAGNOSIS — J209 Acute bronchitis, unspecified: Secondary | ICD-10-CM | POA: Diagnosis not present

## 2017-07-02 ENCOUNTER — Other Ambulatory Visit: Payer: Self-pay

## 2017-07-02 ENCOUNTER — Emergency Department (HOSPITAL_COMMUNITY)
Admission: EM | Admit: 2017-07-02 | Discharge: 2017-07-02 | Disposition: A | Discharge status: Home / Self Care | Payer: Medicaid Other | Attending: Emergency Medicine | Admitting: Emergency Medicine

## 2017-07-02 ENCOUNTER — Encounter (HOSPITAL_COMMUNITY): Payer: Self-pay | Admitting: Emergency Medicine

## 2017-07-02 DIAGNOSIS — Z79899 Other long term (current) drug therapy: Secondary | ICD-10-CM | POA: Diagnosis not present

## 2017-07-02 DIAGNOSIS — Z87891 Personal history of nicotine dependence: Secondary | ICD-10-CM | POA: Insufficient documentation

## 2017-07-02 DIAGNOSIS — H60391 Other infective otitis externa, right ear: Secondary | ICD-10-CM | POA: Diagnosis not present

## 2017-07-02 DIAGNOSIS — H9201 Otalgia, right ear: Secondary | ICD-10-CM | POA: Insufficient documentation

## 2017-07-02 MED ORDER — IBUPROFEN 800 MG PO TABS
800.0000 mg | ORAL_TABLET | Freq: Once | ORAL | Status: AC
  Administered 2017-07-02: 800 mg via ORAL
  Filled 2017-07-02: qty 1

## 2017-07-02 MED ORDER — CEPHALEXIN 500 MG PO CAPS
500.0000 mg | ORAL_CAPSULE | Freq: Once | ORAL | Status: AC
  Administered 2017-07-02: 500 mg via ORAL
  Filled 2017-07-02: qty 1

## 2017-07-02 MED ORDER — IBUPROFEN 800 MG PO TABS: 800.0000 mg | ORAL_TABLET | Freq: Three times a day (TID) | ORAL | 0 refills | Status: DC

## 2017-07-02 MED ORDER — ONDANSETRON HCL 4 MG PO TABS
4.0000 mg | ORAL_TABLET | Freq: Once | ORAL | Status: AC
  Administered 2017-07-02: 4 mg via ORAL
  Filled 2017-07-02: qty 1

## 2017-07-02 MED ORDER — CEFDINIR 300 MG PO CAPS: 300.0000 mg | ORAL_CAPSULE | Freq: Two times a day (BID) | ORAL | 0 refills | Status: DC

## 2017-07-02 MED ORDER — NEOMYCIN-POLYMYXIN-HC 1 % OT SOLN
3.0000 [drp] | Freq: Once | OTIC | Status: AC
  Administered 2017-07-02: 3 [drp] via OTIC
  Filled 2017-07-02: qty 10

## 2017-07-02 MED ORDER — TRAMADOL HCL 50 MG PO TABS
100.0000 mg | ORAL_TABLET | Freq: Once | ORAL | Status: AC
  Administered 2017-07-02: 100 mg via ORAL
  Filled 2017-07-02: qty 2

## 2017-07-02 NOTE — Discharge Instructions (Signed)
You have an infected bump inside the right ear.  You have increased redness of the external canal, and some swollen lymph nodes around the right ear.  Please use 3 drops of Cortisporin 3 times daily over the next 5 to 7 days.  Please use Omnicef 2 times daily with a meal.  Use ibuprofen 3 times daily with a meal.  Please see Dr. Anastasio Champion for additional evaluation and management if not improving.

## 2017-07-02 NOTE — ED Triage Notes (Signed)
Patient c/o right ear pain x1 week that is progressively getting worse. Denies any fevers or sore throat. Per patient "bump in ear" and swelling behind ear." Denies any drainage.

## 2017-07-02 NOTE — ED Provider Notes (Signed)
[facility] EMERGENCY DEPARTMENT Provider Note   CSN: [id number] Arrival date & time: 07/02/17  1239     History   Chief Complaint Chief Complaint  Patient presents with  . Otalgia    HPI Jackie Smith is a 32 y.o. female.  Patient is a 32 year old female who presents to the emergency department with complaint of ear pain.  Patient states she has had problems with her right ear for 1 week.  The pain seems to be getting progressively worse and is beginning to interfere with her activities of daily living.  She denies fever or chills.  She has not seen drainage from her ear.  She has not had trauma that she is aware of.  She states she has not been doing a lot of swimming.  It is of note that she uses a Q-tip, and she says she uses them almost every day.  She now has a bump in her ear, and she also says she has some swelling behind and underneath the right ear.  She presents for evaluation of this issue.       Past Medical History:  Diagnosis Date  . Acid reflux   . Bipolar 1 disorder ([place])   . Frequent UTI   . Gastritis   . History of benign breast tumor   . Infections of kidney   . Irritable bowel syndrome (IBS)   . Thyroid disease     Patient Active Problem List   Diagnosis Date Noted  . Major depression 05/15/2014    Past Surgical History:  Procedure Laterality Date  . c sections    . DILATION AND CURETTAGE OF UTERUS    . INDUCED ABORTION    . LASER ABLATION OF THE CERVIX    . TUBAL LIGATION       OB History    Gravida  3   Para  2   Term  1   Preterm  1   AB  1   Living  3     SAB  1   TAB      Ectopic      Multiple      Live Births               Home Medications    Prior to Admission medications   Medication Sig Start Date End Date Taking? Authorizing Provider  albuterol (PROVENTIL HFA;VENTOLIN HFA) 108 (90 Base) MCG/ACT inhaler Inhale 2 puffs into the lungs every 4 (four) hours as needed for wheezing or shortness of  breath (or coughing). 0/09/38   Delora Fuel, MD  ALPRAZolam Duanne Moron) 1 MG tablet Take 1 mg by mouth 3 (three) times daily.    [provider]  ARIPiprazole (ABILIFY) 20 MG tablet Take 20 mg by mouth daily.    [provider]  Cholecalciferol (VITAMIN D3) 5000 units CAPS Take 1 capsule by mouth daily.    [provider]  famotidine (PEPCID) 20 MG tablet Take 1 tablet (20 mg total) by mouth 2 (two) times daily as needed for heartburn or indigestion. 06/24/16   Evalee Jefferson, PA-C  HYDROcodone-acetaminophen (NORCO) 5-325 MG tablet Take 1-2 tablets by mouth every 6 (six) hours as needed. 03/31/17   Veryl Speak, MD  lamoTRIgine (LAMICTAL) 100 MG tablet Take 100 mg by mouth daily.    [provider]  levothyroxine (SYNTHROID, LEVOTHROID) 25 MCG tablet Take 1 tablet (25 mcg total) by mouth daily before breakfast. Patient taking differently: Take 50 mcg by mouth  daily before breakfast.  11/05/15   Billy Fischer, MD  QUEtiapine (SEROQUEL) 50 MG tablet Take 50 mg by mouth at bedtime.    [provider]  zolpidem (AMBIEN) 10 MG tablet Take 10 mg by mouth at bedtime. 01/28/16   [provider]    Family History Family History  Problem Relation Age of Onset  . Drug abuse Mother   . Bipolar disorder Father   . Drug abuse Father   . Heart disease Unknown   . Arthritis Unknown   . Cancer Unknown   . Asthma Unknown   . Diabetes Unknown   . Depression Sister   . Anxiety disorder Sister   . Bipolar disorder Paternal Uncle     Social History Social History   Tobacco Use  . Smoking status: Former Smoker    Packs/day: 0.25    Types: Cigarettes  . Smokeless tobacco: Never Used  Substance Use Topics  . Alcohol use: No  . Drug use: No     Allergies   Imitrex [sumatriptan base]   Review of Systems Review of Systems  Constitutional: Negative for activity change.       All ROS Neg except as noted in HPI  HENT: Positive for ear pain. Negative  for nosebleeds.   Eyes: Negative for photophobia and discharge.  Respiratory: Negative for cough, shortness of breath and wheezing.   Cardiovascular: Negative for chest pain and palpitations.  Gastrointestinal: Negative for abdominal pain and blood in stool.  Genitourinary: Negative for dysuria, frequency and hematuria.  Musculoskeletal: Negative for arthralgias, back pain and neck pain.  Skin: Negative.   Neurological: Negative for dizziness, seizures and speech difficulty.  Psychiatric/Behavioral: Negative for confusion and hallucinations.     Physical Exam Updated Vital Signs BP 118/76 (BP Location: Right Arm)   Pulse 81   Temp 98.7 F (37.1 C) (Oral)   Resp 18   Ht 5' 3.5" (1.613 m)   Wt 98.9 kg (218 lb)   SpO2 97%   BMI 38.01 kg/m   Physical Exam  Constitutional: She is oriented to person, place, and time. She appears well-developed and well-nourished.  Non-toxic appearance.  HENT:  Head: Normocephalic.  Right Ear: Tympanic membrane and external ear normal.  Left Ear: Tympanic membrane and external ear normal.  The external ear is within normal limits.  The external auditory canal has a red raised bump with a pus head present.  There is increased redness of the external auditory canal.  There is one small area of increased redness at the tympanic membrane.  There is no bulging of the tympanic membrane at this time.  There is a preauricular lymph node present at the area of the lobe and extending underneath the lobe.  This is mildly tender to touch.  Eyes: Pupils are equal, round, and reactive to light. EOM and lids are normal.  Neck: Normal range of motion. Neck supple. Carotid bruit is not present.  Cardiovascular: Normal rate, regular rhythm, normal heart sounds, intact distal pulses and normal pulses.  Pulmonary/Chest: Breath sounds normal. No respiratory distress.  Abdominal: Soft. Bowel sounds are normal. There is no tenderness. There is no guarding.    Musculoskeletal: Normal range of motion.  Lymphadenopathy:       Head (right side): No submandibular adenopathy present.       Head (left side): No submandibular adenopathy present.    She has no cervical adenopathy.  Neurological: She is alert and oriented to person, place, and time. She  has normal strength. No cranial nerve deficit or sensory deficit.  Skin: Skin is warm and dry.  Psychiatric: She has a normal mood and affect. Her speech is normal.  Nursing note and vitals reviewed.    ED Treatments / Results  Labs (all labs ordered are listed, but only abnormal results are displayed) Labs Reviewed - No data to display  EKG None  Radiology No results found.  Procedures Procedures (including critical care time)  Medications Ordered in ED Medications - No data to display   Initial Impression / Assessment and Plan / ED Course  I have reviewed the triage vital signs and the nursing notes.  Pertinent labs & imaging results that were available during my care of the patient were reviewed by me and considered in my medical decision making (see chart for details).      Final Clinical Impressions(s) / ED Diagnoses MDM  Vital signs within normal limits.  Pulse oximetry is 97% on room air.  Within normal limits by my interpretation.  The patient has a bump with pus had in the external auditory canal.  There is increased redness of the external auditory canal.  There are swollen lymph nodes in the preauricular area.  Patient will be treated with ibuprofen and Omnicef.  I have strongly encouraged the patient to stop using Q-tips.  Patient is to follow-up with Dr. Anastasio Champion for follow-up of this problem.   Final diagnoses:  Otalgia of right ear  Infection of external ear, right    ED Discharge Orders        Ordered    cefdinir (OMNICEF) 300 MG capsule  2 times daily     07/02/17 1431    ibuprofen (ADVIL,MOTRIN) 800 MG tablet  3 times daily     07/02/17 1431       Lily Kocher, PA-C 07/02/17 1444    Virgel Manifold, MD 07/02/17 1512

## 2017-10-08 ENCOUNTER — Other Ambulatory Visit: Payer: Self-pay

## 2017-10-08 ENCOUNTER — Encounter (HOSPITAL_COMMUNITY): Payer: Self-pay | Admitting: Emergency Medicine

## 2017-10-08 ENCOUNTER — Emergency Department (HOSPITAL_COMMUNITY)
Admission: EM | Admit: 2017-10-08 | Discharge: 2017-10-08 | Disposition: A | Discharge status: Home / Self Care | Payer: Managed Care, Other (non HMO) | Attending: Emergency Medicine | Admitting: Emergency Medicine

## 2017-10-08 ENCOUNTER — Emergency Department (HOSPITAL_COMMUNITY): Payer: Managed Care, Other (non HMO)

## 2017-10-08 DIAGNOSIS — J209 Acute bronchitis, unspecified: Secondary | ICD-10-CM | POA: Insufficient documentation

## 2017-10-08 DIAGNOSIS — R05 Cough: Secondary | ICD-10-CM | POA: Diagnosis present

## 2017-10-08 DIAGNOSIS — Z79899 Other long term (current) drug therapy: Secondary | ICD-10-CM | POA: Insufficient documentation

## 2017-10-08 DIAGNOSIS — Z87891 Personal history of nicotine dependence: Secondary | ICD-10-CM | POA: Insufficient documentation

## 2017-10-08 MED ORDER — ALBUTEROL SULFATE HFA 108 (90 BASE) MCG/ACT IN AERS: 2.0000 | INHALATION_SPRAY | RESPIRATORY_TRACT | 0 refills | Status: DC | PRN

## 2017-10-08 MED ORDER — AZITHROMYCIN 250 MG PO TABS: 250.0000 mg | ORAL_TABLET | Freq: Every day | ORAL | 0 refills | Status: DC

## 2017-10-08 NOTE — Discharge Instructions (Signed)
You were evaluated in the emergency department for a few days of cough and congestion.  Your chest x-ray did not show an obvious pneumonia.  This is likely an upper respiratory infection/bronchitis.  We are prescribing you an antibiotic and inhaler to use.  You should limit your smoking keep well-hydrated and follow-up with your doctor.  Return if any worsening.

## 2017-10-08 NOTE — ED Triage Notes (Signed)
Pt c/o cough, congestion, with increased SOB. Denies nebulizer or inhaler use.

## 2017-10-08 NOTE — ED Provider Notes (Signed)
[facility] EMERGENCY DEPARTMENT Provider Note   CSN: [id number] Arrival date & time: 10/08/17  1522     History   Chief Complaint Chief Complaint  Patient presents with  . Cough     HPI Jackie Smith is a 32 y.o. female.  She is presenting to the emergency department with few days of upper respiratory symptoms with some nasal congestion and cough but it worsened today where she is feeling increased shortness of breath.  Cough is productive of some yellow sputum and she is also getting some nasal secretions that are yellow.  No fevers no chills.  She is an active smoker.  No sick contacts but she has had some relatives that have been in the hospital and so she has been in and out of hospital for the past few weeks.  No abdominal pain vomiting chest pain.  The history is provided by the patient.  Cough  This is a new problem. The current episode started yesterday. The problem occurs every few minutes. The problem has not changed since onset.The cough is productive of sputum. There has been no fever. Associated symptoms include rhinorrhea, sore throat and shortness of breath. Pertinent negatives include no chest pain, no chills, no sweats, no ear congestion, no ear pain, no headaches and no wheezing. She has tried nothing for the symptoms. The treatment provided no relief. She is a smoker. Her past medical history does not include COPD or emphysema.    Past Medical History:  Diagnosis Date  . Acid reflux   . Bipolar 1 disorder ([place])   . Frequent UTI   . Gastritis   . History of benign breast tumor   . Infections of kidney   . Irritable bowel syndrome (IBS)   . Thyroid disease     Patient Active Problem List   Diagnosis Date Noted  . Major depression 05/15/2014    Past Surgical History:  Procedure Laterality Date  . c sections    . DILATION AND CURETTAGE OF UTERUS    . INDUCED ABORTION    . LASER ABLATION OF THE CERVIX    . TUBAL LIGATION       OB History    Gravida  3   Para  2   Term  1   Preterm  1   AB  1   Living  3     SAB  1   TAB      Ectopic      Multiple      Live Births               Home Medications    Prior to Admission medications   Medication Sig Start Date End Date Taking? Authorizing Provider  albuterol (PROVENTIL HFA;VENTOLIN HFA) 108 (90 Base) MCG/ACT inhaler Inhale 2 puffs into the lungs every 4 (four) hours as needed for wheezing or shortness of breath (or coughing). 8/84/16   Delora Fuel, MD  ALPRAZolam Duanne Moron) 1 MG tablet Take 1 mg by mouth 3 (three) times daily.    [provider]  ARIPiprazole (ABILIFY) 20 MG tablet Take 20 mg by mouth daily.    [provider]  cefdinir (OMNICEF) 300 MG capsule Take 1 capsule (300 mg total) by mouth 2 (two) times daily. 07/02/17   Lily Kocher, PA-C  Cholecalciferol (VITAMIN D3) 5000 units CAPS Take 1 capsule by mouth daily.    [provider]  famotidine (PEPCID) 20 MG tablet Take 1 tablet (20 mg total) by  mouth 2 (two) times daily as needed for heartburn or indigestion. 06/24/16   Evalee Jefferson, PA-C  HYDROcodone-acetaminophen (NORCO) 5-325 MG tablet Take 1-2 tablets by mouth every 6 (six) hours as needed. 03/31/17   Veryl Speak, MD  ibuprofen (ADVIL,MOTRIN) 800 MG tablet Take 1 tablet (800 mg total) by mouth 3 (three) times daily. 07/02/17   Lily Kocher, PA-C  lamoTRIgine (LAMICTAL) 100 MG tablet Take 100 mg by mouth daily.    [provider]  levothyroxine (SYNTHROID, LEVOTHROID) 25 MCG tablet Take 1 tablet (25 mcg total) by mouth daily before breakfast. Patient taking differently: Take 50 mcg by mouth daily before breakfast.  11/05/15   Billy Fischer, MD  QUEtiapine (SEROQUEL) 50 MG tablet Take 50 mg by mouth at bedtime.    [provider]  zolpidem (AMBIEN) 10 MG tablet Take 10 mg by mouth at bedtime. 01/28/16   [provider]    Family History Family History  Problem Relation Age of Onset  . Drug  abuse Mother   . Bipolar disorder Father   . Drug abuse Father   . Heart disease Unknown   . Arthritis Unknown   . Cancer Unknown   . Asthma Unknown   . Diabetes Unknown   . Depression Sister   . Anxiety disorder Sister   . Bipolar disorder Paternal Uncle     Social History Social History   Tobacco Use  . Smoking status: Former Smoker    Packs/day: 0.25    Types: Cigarettes  . Smokeless tobacco: Never Used  Substance Use Topics  . Alcohol use: No  . Drug use: No     Allergies   Imitrex [sumatriptan base]   Review of Systems Review of Systems  Constitutional: Negative for chills and fever.  HENT: Positive for rhinorrhea, sinus pressure and sore throat. Negative for ear pain.   Eyes: Negative for visual disturbance.  Respiratory: Positive for cough and shortness of breath. Negative for wheezing.   Cardiovascular: Negative for chest pain.  Gastrointestinal: Negative for abdominal pain.  Genitourinary: Negative for dysuria.  Musculoskeletal: Negative for neck pain.  Skin: Negative for rash.  Neurological: Negative for headaches.     Physical Exam Updated Vital Signs BP 112/67 (BP Location: Right Arm)   Pulse 74   Temp 98.9 F (37.2 C) (Oral)   Resp 16   Ht 5\' 3"  (1.6 m)   Wt 95.3 kg   SpO2 99%   BMI 37.20 kg/m   Physical Exam  Constitutional: She appears well-developed and well-nourished.  HENT:  Head: Normocephalic and atraumatic.  Right Ear: Tympanic membrane and ear canal normal.  Left Ear: Tympanic membrane and ear canal normal.  Mouth/Throat: Uvula is midline, oropharynx is clear and moist and mucous membranes are normal.  Eyes: Conjunctivae are normal.  Neck: Neck supple.  Cardiovascular: Normal rate, regular rhythm, normal heart sounds and intact distal pulses.  Pulmonary/Chest: Effort normal and breath sounds normal. She has no wheezes. She has no rales.  Abdominal: Soft. She exhibits no mass. There is no tenderness. There is no guarding.    Musculoskeletal: Normal range of motion. She exhibits no edema, tenderness or deformity.  Neurological: She is alert. GCS eye subscore is 4. GCS verbal subscore is 5. GCS motor subscore is 6.  Skin: Skin is warm and dry. Capillary refill takes less than 2 seconds.  Psychiatric: She has a normal mood and affect.  Nursing note and vitals reviewed.    ED Treatments / Results  Labs (all labs ordered are listed, but only abnormal results are displayed) Labs Reviewed - No data to display  EKG None  Radiology Dg Chest 2 View  Result Date: 10/08/2017 CLINICAL DATA:  Acute cough, congestion and shortness of breath. EXAM: CHEST - 2 VIEW COMPARISON:  06/24/2016 and prior radiograph FINDINGS: The cardiomediastinal silhouette is unremarkable. There is no evidence of focal airspace disease, pulmonary edema, suspicious pulmonary nodule/mass, pleural effusion, or pneumothorax. No acute bony abnormalities are identified. IMPRESSION: No active cardiopulmonary disease. Electronically Signed   By: Margarette Canada M.D.   On: 10/08/2017 16:23    Procedures Procedures (including critical care time)  Medications Ordered in ED Medications - No data to display   Initial Impression / Assessment and Plan / ED Course  I have reviewed the triage vital signs and the nursing notes.  Pertinent labs & imaging results that were available during my care of the patient were reviewed by me and considered in my medical decision making (see chart for details).  Clinical Course as of Oct 09 2127  Sun Oct 09, 3731  7124 32 year old smoker here with few days of upper respiratory infection and worsening cough shortness of breath.  She has clear lungs on exam satting 91%.  Will check chest x-ray but doubt pneumonia and think PE very unlikely with her normal vital signs.   [MB]  3545 Patient's x-ray unremarkable.  Her vitals here also satting 99%.  Reviewed this with the patient and she is comfortable going home with a  prescription for antibiotic and inhaler.  She understands to follow-up with her doctor and return if any worsening.   [MB]    Clinical Course User Index [MB] Hayden Rasmussen, MD     Final Clinical Impressions(s) / ED Diagnoses   Final diagnoses:  Acute bronchitis, unspecified organism    ED Discharge Orders         Ordered    albuterol (PROVENTIL HFA;VENTOLIN HFA) 108 (90 Base) MCG/ACT inhaler  Every 4 hours PRN,   Status:  Discontinued     10/08/17 1640    azithromycin (ZITHROMAX) 250 MG tablet  Daily     10/08/17 1640    albuterol (PROVENTIL HFA;VENTOLIN HFA) 108 (90 Base) MCG/ACT inhaler  Every 4 hours PRN,   Status:  Discontinued     10/08/17 1640           Hayden Rasmussen, MD 10/08/17 2129

## 2017-10-27 ENCOUNTER — Emergency Department (HOSPITAL_COMMUNITY): Payer: Managed Care, Other (non HMO)

## 2017-10-27 ENCOUNTER — Encounter (HOSPITAL_COMMUNITY): Payer: Self-pay

## 2017-10-27 ENCOUNTER — Emergency Department (HOSPITAL_COMMUNITY)
Admission: EM | Admit: 2017-10-27 | Discharge: 2017-10-27 | Disposition: A | Discharge status: Home / Self Care | Payer: Managed Care, Other (non HMO) | Attending: Emergency Medicine | Admitting: Emergency Medicine

## 2017-10-27 ENCOUNTER — Other Ambulatory Visit: Payer: Self-pay

## 2017-10-27 DIAGNOSIS — R51 Headache: Secondary | ICD-10-CM | POA: Diagnosis not present

## 2017-10-27 DIAGNOSIS — R0789 Other chest pain: Secondary | ICD-10-CM | POA: Diagnosis not present

## 2017-10-27 DIAGNOSIS — R0602 Shortness of breath: Secondary | ICD-10-CM | POA: Diagnosis not present

## 2017-10-27 DIAGNOSIS — R079 Chest pain, unspecified: Secondary | ICD-10-CM | POA: Diagnosis not present

## 2017-10-27 DIAGNOSIS — Z87891 Personal history of nicotine dependence: Secondary | ICD-10-CM | POA: Insufficient documentation

## 2017-10-27 DIAGNOSIS — Z79899 Other long term (current) drug therapy: Secondary | ICD-10-CM | POA: Diagnosis not present

## 2017-10-27 DIAGNOSIS — F319 Bipolar disorder, unspecified: Secondary | ICD-10-CM | POA: Diagnosis not present

## 2017-10-27 DIAGNOSIS — R05 Cough: Secondary | ICD-10-CM | POA: Diagnosis not present

## 2017-10-27 LAB — D-DIMER, QUANTITATIVE: D-Dimer, Quant: <0.27 ug/mL-FEU (ref 0.00–0.50)

## 2017-10-27 LAB — BASIC METABOLIC PANEL
Anion gap: 7 (ref 5–15)
BUN: 17 mg/dL (ref 6–20)
CHLORIDE: 105 mmol/L (ref 98–111)
CO2: 26 mmol/L (ref 22–32)
Calcium: 9.3 mg/dL (ref 8.9–10.3)
Creatinine, Ser: 0.77 mg/dL (ref 0.44–1.00)
GFR calc non Af Amer: >60 mL/min (ref >60)
Glucose, Bld: 104 mg/dL — ABNORMAL HIGH (ref 70–99)
Potassium: 4 mmol/L (ref 3.5–5.1)
SODIUM: 138 mmol/L (ref 135–145)

## 2017-10-27 LAB — TROPONIN I: Troponin I: <0.03 ng/mL (ref <0.03)

## 2017-10-27 MED ORDER — GI COCKTAIL ~~LOC~~
30.0000 mL | Freq: Once | ORAL | Status: AC
  Administered 2017-10-27: 30 mL via ORAL
  Filled 2017-10-27: qty 30

## 2017-10-27 MED ORDER — OMEPRAZOLE 20 MG PO CPDR: 20.0000 mg | DELAYED_RELEASE_CAPSULE | Freq: Every day | ORAL | 0 refills | Status: DC

## 2017-10-27 MED ORDER — KETOROLAC TROMETHAMINE 60 MG/2ML IM SOLN
60.0000 mg | Freq: Once | INTRAMUSCULAR | Status: AC
  Administered 2017-10-27: 60 mg via INTRAMUSCULAR
  Filled 2017-10-27: qty 2

## 2017-10-27 MED ORDER — IPRATROPIUM-ALBUTEROL 0.5-2.5 (3) MG/3ML IN SOLN
3.0000 mL | Freq: Once | RESPIRATORY_TRACT | Status: AC
  Administered 2017-10-27: 3 mL via RESPIRATORY_TRACT
  Filled 2017-10-27: qty 3

## 2017-10-27 NOTE — ED Provider Notes (Signed)
[facility] Provider Note   CSN: [id number] Arrival date & time: 10/27/17  0351     History   Chief Complaint Chief Complaint  Patient presents with  . Shortness of Breath    HPI Jackie Smith is a 32 y.o. female.   Patient presents to the ED after waking from sleep with a coughing spell and now having chest pain, shortness of breath and headache.  States she went to bed feeling well and was not having any issues with her breathing.  She is a smoker but does not use inhalers on a regular basis. No history of asthma or COPD.  She was seen in the ED on September 22 and treated for bronchitis but states this was doing much better.  States she woke up with a "deep cough" that was nonproductive and is not having pain across her anterior chest with a diffuse frontal headache.  This headache is similar to previous migraines.  Denies fevers, chills, nausea, vomiting.  She tried an albuterol inhaler at home without relief.  Denies any leg pain, leg swelling, birth control use.  The history is provided by the patient.  Shortness of Breath  Associated symptoms include headaches, cough and chest pain. Pertinent negatives include no fever, no rhinorrhea, no neck pain, no vomiting, no abdominal pain and no leg swelling.    Past Medical History:  Diagnosis Date  . Acid reflux   . Bipolar 1 disorder ([place])   . Frequent UTI   . Gastritis   . History of benign breast tumor   . Infections of kidney   . Irritable bowel syndrome (IBS)   . Thyroid disease     Patient Active Problem List   Diagnosis Date Noted  . Major depression 05/15/2014    Past Surgical History:  Procedure Laterality Date  . c sections    . DILATION AND CURETTAGE OF UTERUS    . INDUCED ABORTION    . LASER ABLATION OF THE CERVIX    . TUBAL LIGATION       OB History    Gravida  3   Para  2   Term  1   Preterm  1   AB  1   Living  3     SAB  1   TAB      Ectopic      Multiple      Live Births               Home Medications    Prior to Admission medications   Medication Sig Start Date End Date Taking? Authorizing Provider  albuterol (PROVENTIL HFA;VENTOLIN HFA) 108 (90 Base) MCG/ACT inhaler Inhale into the lungs every 6 (six) hours as needed for wheezing or shortness of breath.   Yes [provider]  ALPRAZolam Duanne Moron) 1 MG tablet Take 1 mg by mouth 3 (three) times daily.    [provider]  ARIPiprazole (ABILIFY) 20 MG tablet Take 20 mg by mouth daily.    [provider]  azithromycin (ZITHROMAX) 250 MG tablet Take 1 tablet (250 mg total) by mouth daily. Take first 2 tablets together, then 1 every day until finished. 10/08/17   Hayden Rasmussen, MD  Cholecalciferol (VITAMIN D3) 5000 units CAPS Take 1 capsule by mouth daily.    [provider]  HYDROcodone-acetaminophen (NORCO) 5-325 MG tablet Take 1-2 tablets by mouth every 6 (six) hours as needed. 03/31/17   Veryl Speak, MD  ibuprofen (ADVIL,MOTRIN)  800 MG tablet Take 1 tablet (800 mg total) by mouth 3 (three) times daily. 07/02/17   Lily Kocher, PA-C  lamoTRIgine (LAMICTAL) 100 MG tablet Take 100 mg by mouth daily.    [provider]  QUEtiapine (SEROQUEL) 50 MG tablet Take 50 mg by mouth at bedtime.    [provider]  zolpidem (AMBIEN) 10 MG tablet Take 10 mg by mouth at bedtime. 01/28/16   [provider]    Family History Family History  Problem Relation Age of Onset  . Drug abuse Mother   . Bipolar disorder Father   . Drug abuse Father   . Heart disease Unknown   . Arthritis Unknown   . Cancer Unknown   . Asthma Unknown   . Diabetes Unknown   . Depression Sister   . Anxiety disorder Sister   . Bipolar disorder Paternal Uncle     Social History Social History   Tobacco Use  . Smoking status: Former Smoker    Packs/day: 0.25    Types: Cigarettes  . Smokeless tobacco: Never Used  Substance Use Topics  . Alcohol  use: No  . Drug use: No     Allergies   Imitrex [sumatriptan base]   Review of Systems Review of Systems  Constitutional: Negative for activity change, appetite change and fever.  HENT: Negative for congestion and rhinorrhea.   Eyes: Negative for visual disturbance.  Respiratory: Positive for cough, chest tightness and shortness of breath.   Cardiovascular: Positive for chest pain. Negative for leg swelling.  Gastrointestinal: Negative for abdominal pain, nausea and vomiting.  Genitourinary: Negative for dysuria, hematuria, vaginal bleeding and vaginal discharge.  Musculoskeletal: Negative for arthralgias, back pain, myalgias and neck pain.  Neurological: Positive for headaches. Negative for dizziness and weakness.   all other systems are negative except as noted in the HPI and PMH.     Physical Exam Updated Vital Signs BP 112/70 (BP Location: Right Arm)   Pulse 92   Temp 97.7 F (36.5 C) (Oral)   Resp 17   Ht 5\' 4"  (1.626 m)   Wt 95.3 kg   SpO2 96%   BMI 36.05 kg/m   Physical Exam  Constitutional: She is oriented to person, place, and time. She appears well-developed and well-nourished. No distress.  Speaking in full sentences. No distress  HENT:  Head: Normocephalic and atraumatic.  Mouth/Throat: Oropharynx is clear and moist. No oropharyngeal exudate.  Eyes: Pupils are equal, round, and reactive to light. Conjunctivae and EOM are normal.  Neck: Normal range of motion. Neck supple.  No meningismus.  Cardiovascular: Normal rate, regular rhythm, normal heart sounds and intact distal pulses.  No murmur heard. Pulmonary/Chest: Effort normal and breath sounds normal. No respiratory distress. She has no wheezes. She exhibits no tenderness.  Lungs clear, no wheezing Chest pain not reproducible.  Abdominal: Soft. There is no tenderness. There is no rebound and no guarding.  Musculoskeletal: Normal range of motion. She exhibits no edema or tenderness.  Neurological: She  is alert and oriented to person, place, and time. No cranial nerve deficit. She exhibits normal muscle tone. Coordination normal.  No ataxia on finger to nose bilaterally. No pronator drift. 5/5 strength throughout. CN 2-12 intact.Equal grip strength. Sensation intact.   Skin: Skin is warm. Capillary refill takes less than 2 seconds. No rash noted.  Psychiatric: She has a normal mood and affect. Her behavior is normal.  Nursing note and vitals reviewed.    ED Treatments / Results  Labs (all labs ordered are listed, but only abnormal results are displayed) Labs Reviewed  BASIC METABOLIC PANEL - Abnormal; Notable for the following components:      Result Value   Glucose, Bld 104 (*)    All other components within normal limits  D-DIMER, QUANTITATIVE (NOT AT [facility])  TROPONIN I    EKG EKG Interpretation  Date/Time:  Friday October 27 2017 04:06:59 EDT Ventricular Rate:  90 PR Interval:    QRS Duration: 83 QT Interval:  355 QTC Calculation: 435 R Axis:   77 Text Interpretation:  Sinus rhythm Short PR interval Low voltage, precordial leads Nonspecific T abnrm, anterolateral leads No significant change was found Confirmed by Ezequiel Essex [phone]) on 10/27/2017 4:12:00 AM   Radiology Dg Chest 2 View  Result Date: 10/27/2017 CLINICAL DATA:  Nonproductive cough. Shortness of breath. Headache. Chest pain. Previous smoker. EXAM: CHEST - 2 VIEW COMPARISON:  10/08/2017 FINDINGS: The heart size and mediastinal contours are within normal limits. Both lungs are clear. The visualized skeletal structures are unremarkable. IMPRESSION: No active cardiopulmonary disease. Electronically Signed   By: Lucienne Capers M.D.   On: 10/27/2017 05:42   Ct Head Wo Contrast  Result Date: 10/27/2017 CLINICAL DATA:  Acute onset severe headache EXAM: CT HEAD WITHOUT CONTRAST TECHNIQUE: Contiguous axial images were obtained from the base of the skull through the vertex without intravenous contrast.  COMPARISON:  December 22, 2009 FINDINGS: Brain: The ventricles are normal in size and configuration. There is no intracranial mass, hemorrhage, extra-axial fluid collection, or midline shift. Brain parenchyma appears unremarkable. No evident acute infarct. Vascular: No hyperdense vessel. No appreciable vascular calcification. Skull: Bony calvarium appears intact. Sinuses/Orbits: There is mucosal thickening in several ethmoid air cells. Other visualized paranasal sinuses are clear. Orbits appear symmetric bilaterally. Other: Visualized mastoid air cells are clear. IMPRESSION: Mucosal thickening in several ethmoid air cells. Study otherwise unremarkable. Electronically Signed   By: Lowella Grip III M.D.   On: 10/27/2017 07:07    Procedures Procedures (including critical care time)  Medications Ordered in ED Medications  ipratropium-albuterol (DUONEB) 0.5-2.5 (3) MG/3ML nebulizer solution 3 mL (has no administration in time range)  gi cocktail (Maalox,Lidocaine,Donnatal) (has no administration in time range)     Initial Impression / Assessment and Plan / ED Course  I have reviewed the triage vital signs and the nursing notes.  Pertinent labs & imaging results that were available during my care of the patient were reviewed by me and considered in my medical decision making (see chart for details).    Patient awoke from sleep with a coughing fit and now has chest pain shortness of breath and headache.  Felt well when she went to bed.  She is in no distress with clear lungs.  Her EKG is nonischemic.  Patient feels improved after GI cocktail and breathing treatment.  Her chest x-ray is negative.  EKG is nonischemic.  Troponin and d-dimer negative.  Low suspicion for ACS or pulmonary embolism.  Patient does report history of GERD but has not been taking any medication for it.  She has been out of her thyroid medication for several months and is to follow-up with your doctor.  CT scan obtained  given sudden onset of atypical headache.  This is negative within 6 hours of headache onset which rules out subarachnoid hemorrhage.  Low suspicion for meningitis or temporal arteritis.  Suspect patient's chest pain likely due to her coughing spell as well as possible GERD component.  Advised  start PPI, avoid alcohol, NSAIDs, caffeine, spicy foods. Follow-up with PCP.  Return precautions discussed. Final Clinical Impressions(s) / ED Diagnoses   Final diagnoses:  Atypical chest pain  Shortness of breath    ED Discharge Orders    None       Tavish Gettis, Annie Main, MD 10/27/17 [phone]

## 2017-10-27 NOTE — ED Triage Notes (Signed)
Pt states she awoke coughing and from there felt sob and now with a headache and chest pain.  Pt states everything started at once.

## 2017-10-27 NOTE — Discharge Instructions (Addendum)
Your testing does not show any evidence of heart attack or blood clot in the lung. Take the stomach medication as prescribed. You should follow-up with your doctor to get restarted on your thyroid medication.  Avoid alcohol, NSAID medication, spicy foods, caffeine.  Return to the ED if you develop new or worsening symptoms.

## 2017-12-25 ENCOUNTER — Other Ambulatory Visit (HOSPITAL_COMMUNITY): Payer: Self-pay | Admitting: Nurse Practitioner

## 2017-12-25 DIAGNOSIS — N63 Unspecified lump in unspecified breast: Secondary | ICD-10-CM

## 2018-01-16 ENCOUNTER — Ambulatory Visit (HOSPITAL_COMMUNITY): Payer: Managed Care, Other (non HMO)

## 2018-01-16 ENCOUNTER — Encounter (HOSPITAL_COMMUNITY): Payer: Self-pay

## 2018-01-22 ENCOUNTER — Other Ambulatory Visit (HOSPITAL_COMMUNITY): Payer: Self-pay | Admitting: Internal Medicine

## 2018-01-22 ENCOUNTER — Other Ambulatory Visit: Payer: Self-pay | Admitting: Adult Health

## 2018-01-23 DIAGNOSIS — F410 Panic disorder [episodic paroxysmal anxiety] without agoraphobia: Secondary | ICD-10-CM | POA: Diagnosis not present

## 2018-02-13 ENCOUNTER — Ambulatory Visit (HOSPITAL_COMMUNITY): Payer: BLUE CROSS/BLUE SHIELD

## 2018-02-13 ENCOUNTER — Inpatient Hospital Stay (HOSPITAL_COMMUNITY): Admission: RE | Admit: 2018-02-13 | Payer: Self-pay | Source: Ambulatory Visit

## 2018-02-13 ENCOUNTER — Encounter (HOSPITAL_COMMUNITY): Payer: Self-pay

## 2018-03-07 ENCOUNTER — Encounter: Payer: Self-pay | Admitting: Adult Health

## 2018-03-07 ENCOUNTER — Other Ambulatory Visit (HOSPITAL_COMMUNITY)
Admission: RE | Admit: 2018-03-07 | Discharge: 2018-03-07 | Disposition: A | Discharge status: Home / Self Care | Payer: BLUE CROSS/BLUE SHIELD | Source: Ambulatory Visit | Attending: Adult Health | Admitting: Adult Health

## 2018-03-07 ENCOUNTER — Ambulatory Visit (INDEPENDENT_AMBULATORY_CARE_PROVIDER_SITE_OTHER): Payer: BLUE CROSS/BLUE SHIELD | Admitting: Adult Health

## 2018-03-07 VITALS — BP 109/71 | HR 88 | Ht 63.5 in | Wt 229.2 lb

## 2018-03-07 DIAGNOSIS — Z01419 Encounter for gynecological examination (general) (routine) without abnormal findings: Secondary | ICD-10-CM | POA: Insufficient documentation

## 2018-03-07 DIAGNOSIS — Z8742 Personal history of other diseases of the female genital tract: Secondary | ICD-10-CM

## 2018-03-07 NOTE — Progress Notes (Signed)
Patient ID: Octivia Canion, female   DOB: [birth year], 33 y.o.   MRN: [id number] History of Present Illness: Jackie Smith is a 33 year old white female, married, G5P3 in for a well woman gyn exam and pap. PCP is Dr Anastasio Champion. And she sees Dr Dwaine Gale at [facility].    Current Medications, Allergies, Past Medical History, Past Surgical History, Family History and Social History were reviewed in [employer] record.     Review of Systems: Patient denies any headaches, hearing loss, fatigue, blurred vision, shortness of breath, chest pain, abdominal pain, problems with bowel movements, urination, or intercourse. No joint pain or mood swings.    Physical Exam:BP 109/71 (BP Location: Left Arm, Patient Position: Sitting, Cuff Size: Normal)   Pulse 88   Ht 5' 3.5" (1.613 m)   Wt 229 lb 3.2 oz (104 kg)   BMI 39.96 kg/m  General:  Well developed, well nourished, no acute distress Skin:  Warm and dry Neck:  Midline trachea, normal thyroid, good ROM, no lymphadenopathy Lungs; Clear to auscultation bilaterally Breast:  No dominant palpable mass, retraction, or nipple discharge Cardiovascular: Regular rate and rhythm Abdomen:  Soft, non tender, no hepatosplenomegaly Pelvic:  External genitalia is normal in appearance, no lesions.  The vagina is normal in appearance. Urethra has no lesions or masses. The cervix is bulbous. Pap with HPV performed. Uterus is felt to be normal size, shape, and contour.  No adnexal masses or tenderness noted.Bladder is non tender, no masses felt Extremities/musculoskeletal:  No swelling or varicosities noted, no clubbing or cyanosis Psych:  No mood changes, alert and cooperative,seems happy Fall risk is low. PHQ 2 score 0. Examination chaperoned by Estill Bamberg Rash LPN.  Impression: 1. Encounter for gynecological examination with Papanicolaou smear of cervix   2. History of abnormal cervical Pap smear       Plan: Physical in 1  year Pap in 3 if normal Labs with PCP Mammogram at 38

## 2018-03-09 LAB — CYTOLOGY - PAP
Adequacy: ABSENT
DIAGNOSIS: NEGATIVE
HPV (WINDOPATH): NOT DETECTED

## 2018-03-12 ENCOUNTER — Telehealth: Payer: Self-pay | Admitting: *Deleted

## 2018-03-12 NOTE — Telephone Encounter (Signed)
Pt requesting pap results. DOB verified. Informed pt that pap negative.

## 2018-04-06 ENCOUNTER — Encounter (INDEPENDENT_AMBULATORY_CARE_PROVIDER_SITE_OTHER): Payer: Self-pay | Admitting: Internal Medicine

## 2018-04-11 DIAGNOSIS — F310 Bipolar disorder, current episode hypomanic: Secondary | ICD-10-CM | POA: Diagnosis not present

## 2018-04-11 DIAGNOSIS — F411 Generalized anxiety disorder: Secondary | ICD-10-CM | POA: Diagnosis not present

## 2018-04-11 DIAGNOSIS — E669 Obesity, unspecified: Secondary | ICD-10-CM | POA: Diagnosis not present

## 2018-04-11 DIAGNOSIS — E039 Hypothyroidism, unspecified: Secondary | ICD-10-CM | POA: Diagnosis not present

## 2018-04-11 DIAGNOSIS — R5383 Other fatigue: Secondary | ICD-10-CM | POA: Diagnosis not present

## 2018-05-14 DIAGNOSIS — J019 Acute sinusitis, unspecified: Secondary | ICD-10-CM | POA: Diagnosis not present

## 2018-05-16 DIAGNOSIS — J019 Acute sinusitis, unspecified: Secondary | ICD-10-CM | POA: Diagnosis not present

## 2018-05-16 DIAGNOSIS — E1121 Type 2 diabetes mellitus with diabetic nephropathy: Secondary | ICD-10-CM | POA: Diagnosis not present

## 2018-05-28 DIAGNOSIS — J019 Acute sinusitis, unspecified: Secondary | ICD-10-CM | POA: Diagnosis not present

## 2018-05-28 DIAGNOSIS — R509 Fever, unspecified: Secondary | ICD-10-CM | POA: Diagnosis not present

## 2018-06-26 ENCOUNTER — Emergency Department (HOSPITAL_COMMUNITY)
Admission: EM | Admit: 2018-06-26 | Discharge: 2018-06-26 | Disposition: A | Discharge status: Home / Self Care | Payer: BC Managed Care – PPO | Attending: Emergency Medicine | Admitting: Emergency Medicine

## 2018-06-26 ENCOUNTER — Encounter (HOSPITAL_COMMUNITY): Payer: Self-pay

## 2018-06-26 ENCOUNTER — Other Ambulatory Visit: Payer: Self-pay

## 2018-06-26 ENCOUNTER — Emergency Department (HOSPITAL_COMMUNITY): Payer: BC Managed Care – PPO

## 2018-06-26 DIAGNOSIS — F439 Reaction to severe stress, unspecified: Secondary | ICD-10-CM

## 2018-06-26 DIAGNOSIS — R0789 Other chest pain: Secondary | ICD-10-CM

## 2018-06-26 DIAGNOSIS — Z79899 Other long term (current) drug therapy: Secondary | ICD-10-CM | POA: Diagnosis not present

## 2018-06-26 DIAGNOSIS — Z733 Stress, not elsewhere classified: Secondary | ICD-10-CM | POA: Diagnosis not present

## 2018-06-26 DIAGNOSIS — Z87891 Personal history of nicotine dependence: Secondary | ICD-10-CM | POA: Diagnosis not present

## 2018-06-26 DIAGNOSIS — E669 Obesity, unspecified: Secondary | ICD-10-CM | POA: Diagnosis not present

## 2018-06-26 DIAGNOSIS — R079 Chest pain, unspecified: Secondary | ICD-10-CM | POA: Diagnosis not present

## 2018-06-26 LAB — TROPONIN I: Troponin I: <0.03 ng/mL (ref <0.03)

## 2018-06-26 MED ORDER — LORAZEPAM 2 MG/ML IJ SOLN
1.0000 mg | Freq: Once | INTRAMUSCULAR | Status: AC
  Administered 2018-06-26: 1 mg via INTRAVENOUS
  Filled 2018-06-26: qty 1

## 2018-06-26 NOTE — ED Triage Notes (Signed)
Pt reports chest pain and shortness of breath x one week intermittenly. Pt reports she had another stressful event happen tonight and symptoms started again while lying in bed. Pt reports having panic attacks and is on medication (xanax taking 3 times daily), but hasn't had a panic attack in "a while" , "so isn't sure if that is what this is." No other symptoms reported.

## 2018-06-26 NOTE — Discharge Instructions (Addendum)
You were seen today for chest pain.  Your work-up is reassuring.  Your heart testing is negative.  Your chest x-ray is also clear.  Make sure to monitor your stress closely at home.  Follow-up closely with your primary physician for recheck if your symptoms do not go away.

## 2018-06-26 NOTE — ED Provider Notes (Signed)
[facility] EMERGENCY DEPARTMENT Provider Note   CSN: [id number] Arrival date & time: 06/26/18  0221    History   Chief Complaint Chief Complaint  Patient presents with   Chest Pain    HPI Jackie Smith is a 33 y.o. female.     HPI  This is a 33 year old female with a history of thyroid disease, bipolar disorder who presents with chest pain.  Patient reports 1 week history of intermittent chest pain.  She states that it is anterior and pressure-like.  It comes and goes.  It is not associated with exertion.  She denies any fever, cough, shortness of breath.  Patient reports increased stressors at home.  She states she has a history of panic attacks and wonders if this may be related.  Pain seems to have worsened recently over some significant family stressors.  She denies any lower extremity swelling, history of blood clots, estrogen use.  Currently she rates her pain at 9 out of 10.  Past Medical History:  Diagnosis Date   Acid reflux    Bipolar 1 disorder ([facility])    Frequent UTI    Gastritis    History of benign breast tumor    Infections of kidney    Irritable bowel syndrome (IBS)    Thyroid disease    Vaginal Pap smear, abnormal     Patient Active Problem List   Diagnosis Date Noted   Major depression 05/15/2014    Past Surgical History:  Procedure Laterality Date   c sections     DILATION AND CURETTAGE OF UTERUS     INDUCED ABORTION     LASER ABLATION OF THE CERVIX     TUBAL LIGATION       OB History    Gravida  5   Para  4   Term  3   Preterm  1   AB  1   Living  3     SAB  0   TAB  1   Ectopic      Multiple      Live Births               Home Medications    Prior to Admission medications   Medication Sig Start Date End Date Taking? Authorizing Provider  albuterol (PROVENTIL HFA;VENTOLIN HFA) 108 (90 Base) MCG/ACT inhaler Inhale into the lungs every 6 (six) hours as needed for wheezing or shortness of  breath.    [provider]  ALPRAZolam Duanne Moron) 1 MG tablet Take 1 mg by mouth 3 (three) times daily.    [provider]  ARIPiprazole (ABILIFY) 20 MG tablet Take 20 mg by mouth daily.    [provider]  Cholecalciferol (VITAMIN D3) 5000 units CAPS Take 1 capsule by mouth daily.    [provider]  lamoTRIgine (LAMICTAL) 100 MG tablet Take 100 mg by mouth daily.    [provider]  NP THYROID 30 MG tablet  02/27/18   [provider]  QUEtiapine (SEROQUEL) 50 MG tablet Take 50 mg by mouth at bedtime.    [provider]    Family History Family History  Problem Relation Age of Onset   Drug abuse Mother    Cancer Mother        cervix   Bipolar disorder Father    Drug abuse Father    Heart disease Other    Arthritis Other    Cancer Other    Asthma Other  Diabetes Other    Depression Sister    Anxiety disorder Sister    Bipolar disorder Paternal Uncle     Social History Social History   Tobacco Use   Smoking status: Former Smoker    Packs/day: 0.25    Types: Cigarettes    Last attempt to quit: 04/26/2014    Years since quitting: 4.1   Smokeless tobacco: Never Used  Substance Use Topics   Alcohol use: No   Drug use: No     Allergies   Imitrex [sumatriptan base]   Review of Systems Review of Systems  Constitutional: Negative for fever.  Respiratory: Negative for cough and shortness of breath.   Cardiovascular: Positive for chest pain. Negative for leg swelling.  Gastrointestinal: Negative for abdominal pain, nausea and vomiting.  Skin: Negative for rash.  Psychiatric/Behavioral: The patient is nervous/anxious.   All other systems reviewed and are negative.    Physical Exam Updated Vital Signs BP 108/73 (BP Location: Left Arm)    Pulse 75    Temp 98.3 F (36.8 C) (Oral)    Resp 17    Ht 1.626 m (5\' 4" )    Wt 103.4 kg    SpO2 99%    BMI 39.14 kg/m   Physical Exam Vitals signs and  nursing note reviewed.  Constitutional:      Appearance: She is well-developed. She is obese.  HENT:     Head: Normocephalic and atraumatic.  Eyes:     Pupils: Pupils are equal, round, and reactive to light.  Neck:     Musculoskeletal: Neck supple.  Cardiovascular:     Rate and Rhythm: Normal rate and regular rhythm.     Heart sounds: Normal heart sounds.  Pulmonary:     Effort: Pulmonary effort is normal. No respiratory distress.     Breath sounds: No wheezing.  Abdominal:     General: Bowel sounds are normal.     Palpations: Abdomen is soft.  Musculoskeletal:     Right lower leg: She exhibits no tenderness. No edema.     Left lower leg: She exhibits no tenderness. No edema.  Skin:    General: Skin is warm and dry.  Neurological:     Mental Status: She is alert and oriented to person, place, and time.  Psychiatric:        Mood and Affect: Mood is anxious.      ED Treatments / Results  Labs (all labs ordered are listed, but only abnormal results are displayed) Labs Reviewed  TROPONIN I    EKG EKG Interpretation  Date/Time:  Tuesday June 26 2018 02:45:00 EDT Ventricular Rate:  82 PR Interval:    QRS Duration: 98 QT Interval:  397 QTC Calculation: 464 R Axis:   86 Text Interpretation:  Sinus rhythm Borderline short PR interval Low voltage, precordial leads Nonspecific T abnrm, anterolateral leads No significant change since last tracing Confirmed by Thayer Jew [phone]) on 06/26/2018 2:48:35 AM   Radiology Dg Chest 2 View  Result Date: 06/26/2018 CLINICAL DATA:  33 year old female with chest pain and shortness of breath intermittently for 1 week. Former smoker. EXAM: CHEST - 2 VIEW COMPARISON:  10/27/2017 and earlier. FINDINGS: Lung volumes and mediastinal contours remain normal. Visualized tracheal air column is within normal limits. Stable mild bilateral increased interstitial markings. No pneumothorax, pleural effusion or acute pulmonary opacity. No acute  osseous abnormality identified. Negative visible bowel gas pattern. IMPRESSION: No acute cardiopulmonary abnormality. Electronically Signed   By: Genevie Ann  M.D.   On: 06/26/2018 03:50    Procedures Procedures (including critical care time)  Medications Ordered in ED Medications  LORazepam (ATIVAN) injection 1 mg (1 mg Intravenous Given 06/26/18 0329)     Initial Impression / Assessment and Plan / ED Course  I have reviewed the triage vital signs and the nursing notes.  Pertinent labs & imaging results that were available during my care of the patient were reviewed by me and considered in my medical decision making (see chart for details).        Patient presents with chest pain.  On and off for the last week.  Pain is very atypical in nature.  EKG shows no evidence of arrhythmia or ischemia.  Chest x-ray does not show any evidence of pneumothorax or pneumonia.  She is PERC negative.  Vital signs are reassuring.  Aside from her obesity, she does not have any significant risk factors for ACS.  Doubt this is the cause.  Could be related to ongoing stress.  Patient was given Ativan.  She takes Xanax at home.  She was reassured.  Recommend close follow-up with her primary physician if symptoms not improving.  After history, exam, and medical workup I feel the patient has been appropriately medically screened and is safe for discharge home. Pertinent diagnoses were discussed with the patient. Patient was given return precautions.   Final Clinical Impressions(s) / ED Diagnoses   Final diagnoses:  Atypical chest pain  Stress    ED Discharge Orders    None       Merryl Hacker, MD 06/26/18 [phone]

## 2018-07-11 DIAGNOSIS — F411 Generalized anxiety disorder: Secondary | ICD-10-CM | POA: Diagnosis not present

## 2018-07-11 DIAGNOSIS — F310 Bipolar disorder, current episode hypomanic: Secondary | ICD-10-CM | POA: Diagnosis not present

## 2018-07-12 DIAGNOSIS — F310 Bipolar disorder, current episode hypomanic: Secondary | ICD-10-CM | POA: Diagnosis not present

## 2018-07-12 DIAGNOSIS — F410 Panic disorder [episodic paroxysmal anxiety] without agoraphobia: Secondary | ICD-10-CM | POA: Diagnosis not present

## 2018-08-08 DIAGNOSIS — F411 Generalized anxiety disorder: Secondary | ICD-10-CM | POA: Diagnosis not present

## 2018-08-08 DIAGNOSIS — F310 Bipolar disorder, current episode hypomanic: Secondary | ICD-10-CM | POA: Diagnosis not present

## 2018-09-26 DIAGNOSIS — F4321 Adjustment disorder with depressed mood: Secondary | ICD-10-CM | POA: Diagnosis not present

## 2018-10-03 ENCOUNTER — Other Ambulatory Visit (INDEPENDENT_AMBULATORY_CARE_PROVIDER_SITE_OTHER): Payer: Self-pay | Admitting: Internal Medicine

## 2018-10-03 MED ORDER — THYROID 60 MG PO TABS: 60.0000 mg | ORAL_TABLET | Freq: Every day | ORAL | 3 refills | Status: DC

## 2018-10-17 DIAGNOSIS — F3131 Bipolar disorder, current episode depressed, mild: Secondary | ICD-10-CM | POA: Diagnosis not present

## 2018-11-10 ENCOUNTER — Emergency Department (HOSPITAL_COMMUNITY)
Admission: EM | Admit: 2018-11-10 | Discharge: 2018-11-10 | Disposition: A | Discharge status: Home / Self Care | Payer: BC Managed Care – PPO | Attending: Emergency Medicine | Admitting: Emergency Medicine

## 2018-11-10 ENCOUNTER — Encounter (HOSPITAL_COMMUNITY): Payer: Self-pay | Admitting: Emergency Medicine

## 2018-11-10 ENCOUNTER — Emergency Department (HOSPITAL_COMMUNITY): Payer: BC Managed Care – PPO

## 2018-11-10 ENCOUNTER — Other Ambulatory Visit: Payer: Self-pay

## 2018-11-10 DIAGNOSIS — Z79899 Other long term (current) drug therapy: Secondary | ICD-10-CM | POA: Insufficient documentation

## 2018-11-10 DIAGNOSIS — Z87891 Personal history of nicotine dependence: Secondary | ICD-10-CM | POA: Diagnosis not present

## 2018-11-10 DIAGNOSIS — R0789 Other chest pain: Secondary | ICD-10-CM | POA: Diagnosis not present

## 2018-11-10 DIAGNOSIS — F419 Anxiety disorder, unspecified: Secondary | ICD-10-CM | POA: Insufficient documentation

## 2018-11-10 DIAGNOSIS — R079 Chest pain, unspecified: Secondary | ICD-10-CM | POA: Diagnosis not present

## 2018-11-10 DIAGNOSIS — R0602 Shortness of breath: Secondary | ICD-10-CM | POA: Diagnosis not present

## 2018-11-10 LAB — TROPONIN I (HIGH SENSITIVITY): Troponin I (High Sensitivity): <2 ng/L (ref <18)

## 2018-11-10 MED ORDER — LORAZEPAM 1 MG PO TABS
1.0000 mg | ORAL_TABLET | Freq: Once | ORAL | Status: AC
  Administered 2018-11-10: 20:00:00 1 mg via ORAL
  Filled 2018-11-10: qty 1

## 2018-11-10 NOTE — Discharge Instructions (Addendum)
Your vital signs are within normal limits.  Your chest x-ray is negative for any acute changes.  Your EKG shows a normal sinus rhythm, and your heart enzyme is negative for any acute heart related problem. Please discuss your symptoms with Dr. Anastasio Champion as this may be related to stress related issue.  Please return to the emergency department if any emergent changes in your condition, problems, or concerns.
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
## 2018-11-13 DIAGNOSIS — F3181 Bipolar II disorder: Secondary | ICD-10-CM | POA: Diagnosis not present

## 2018-11-14 ENCOUNTER — Other Ambulatory Visit: Payer: Self-pay | Admitting: *Deleted

## 2018-11-14 DIAGNOSIS — Z20822 Contact with and (suspected) exposure to covid-19: Secondary | ICD-10-CM

## 2018-11-15 LAB — NOVEL CORONAVIRUS, NAA: SARS-CoV-2, NAA: NOT DETECTED

## 2018-11-28 DIAGNOSIS — F3181 Bipolar II disorder: Secondary | ICD-10-CM | POA: Diagnosis not present

## 2018-12-24 ENCOUNTER — Encounter (INDEPENDENT_AMBULATORY_CARE_PROVIDER_SITE_OTHER): Payer: Managed Care, Other (non HMO) | Admitting: Internal Medicine

## 2018-12-26 DIAGNOSIS — F3181 Bipolar II disorder: Secondary | ICD-10-CM | POA: Diagnosis not present

## 2019-03-13 IMAGING — DX DG CHEST 2V
2 series · 2 of 2 positions shown · non-contrast
Comparison: 06/24/2016 and prior radiograph

CLINICAL DATA: Acute cough, congestion and shortness of breath.

EXAM:
CHEST - 2 VIEW

[chest pa]
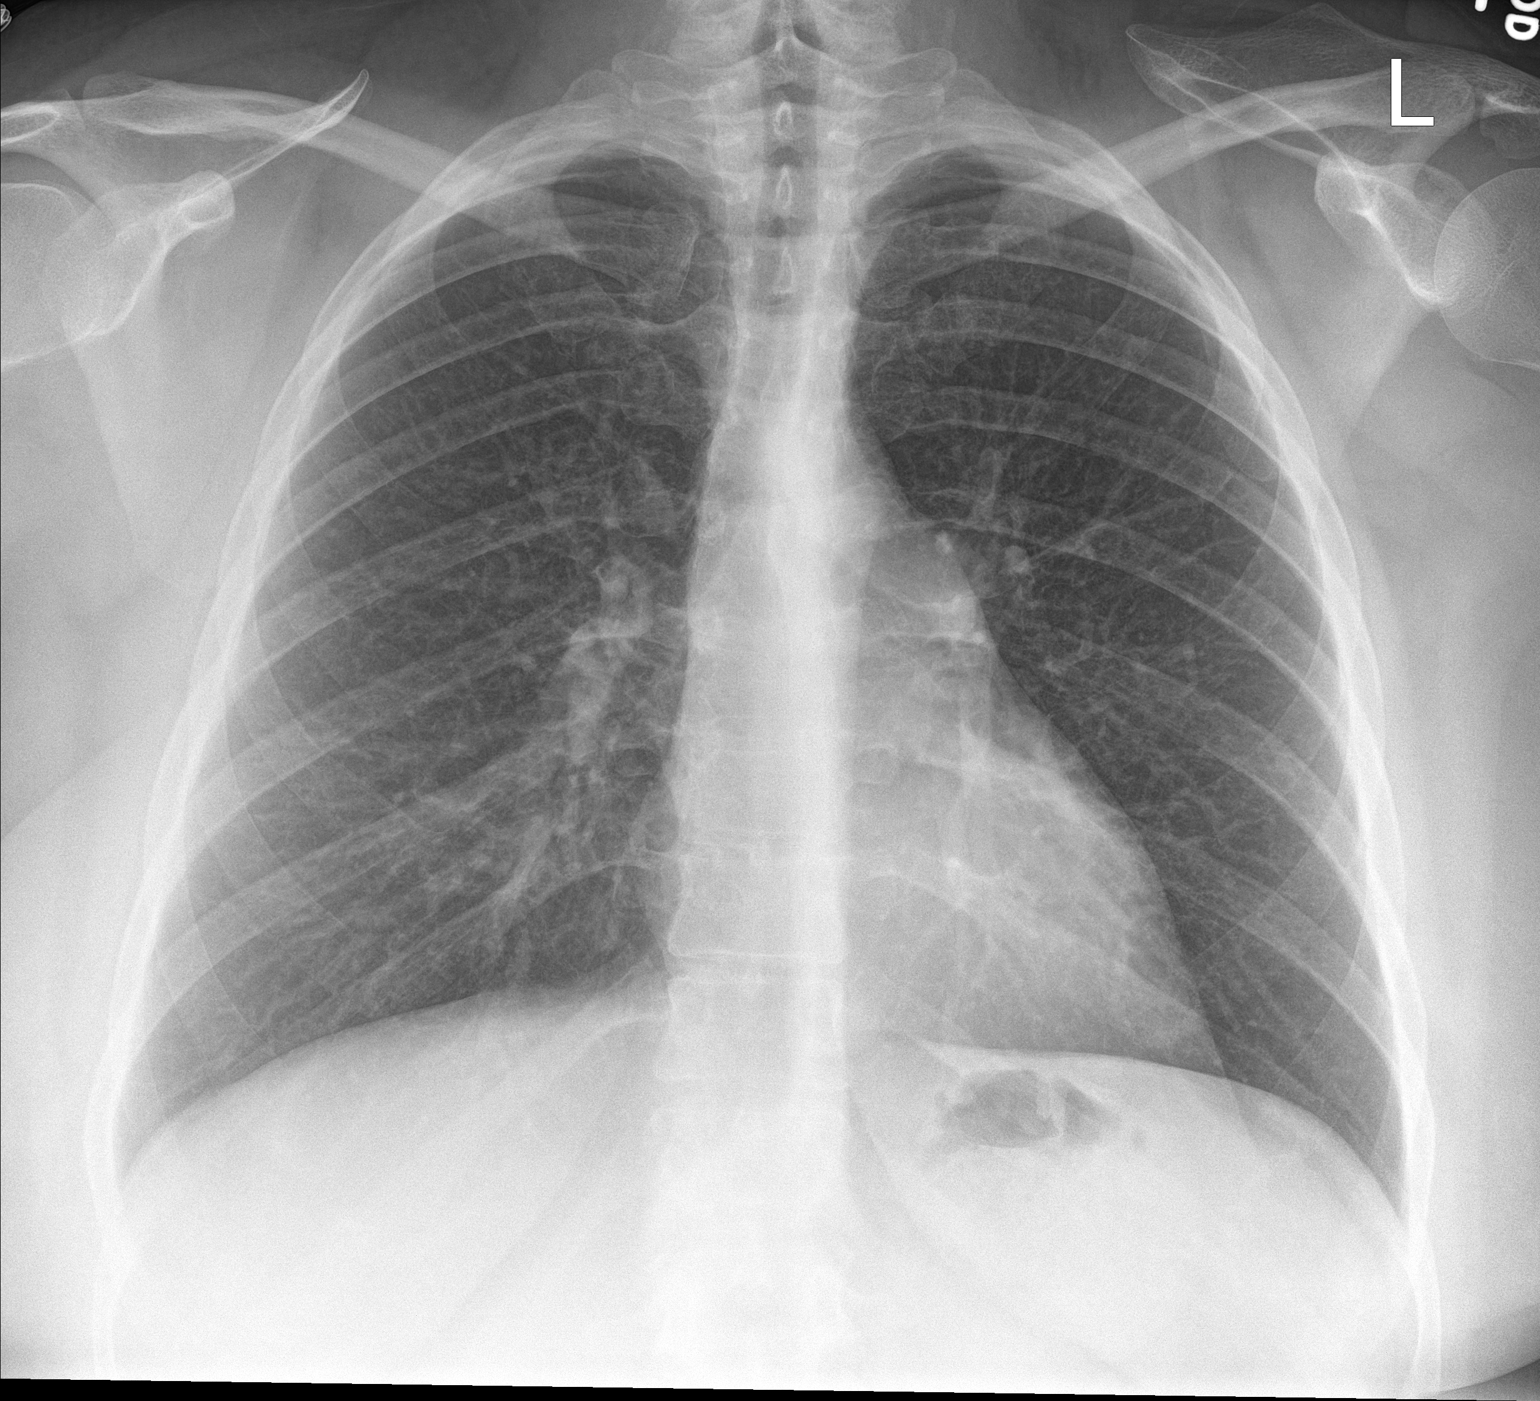

[chest lat]
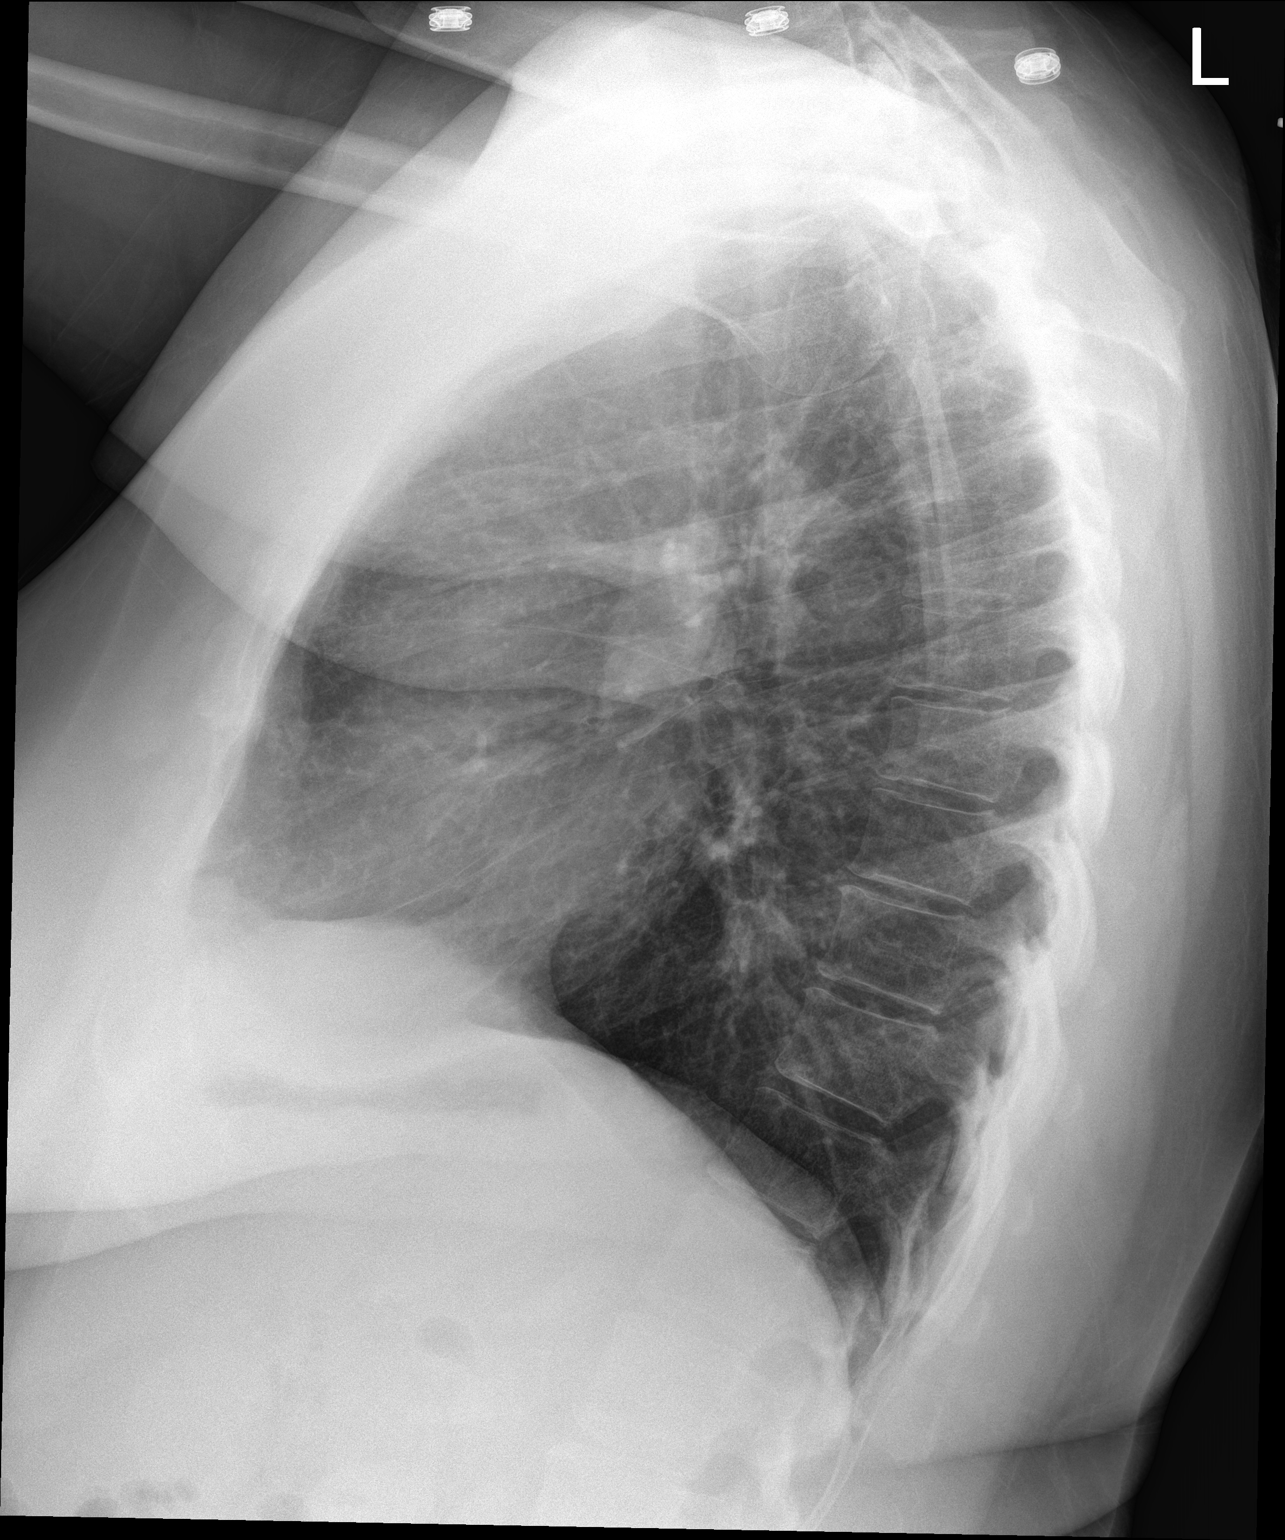

[2 of 2 positions shown; findings below may reference images not displayed]

FINDINGS: The cardiomediastinal silhouette is unremarkable.

There is no evidence of focal airspace disease, pulmonary edema,
suspicious pulmonary nodule/mass, pleural effusion, or pneumothorax.

No acute bony abnormalities are identified.
IMPRESSION: No active cardiopulmonary disease.

## 2019-04-04 ENCOUNTER — Other Ambulatory Visit (INDEPENDENT_AMBULATORY_CARE_PROVIDER_SITE_OTHER): Payer: Self-pay

## 2019-04-10 ENCOUNTER — Other Ambulatory Visit: Payer: Self-pay

## 2019-04-10 ENCOUNTER — Emergency Department (HOSPITAL_COMMUNITY)
Admission: EM | Admit: 2019-04-10 | Discharge: 2019-04-10 | Disposition: A | Discharge status: Home / Self Care | Payer: Medicaid Other | Attending: Emergency Medicine | Admitting: Emergency Medicine

## 2019-04-10 ENCOUNTER — Encounter (HOSPITAL_COMMUNITY): Payer: Self-pay | Admitting: Emergency Medicine

## 2019-04-10 DIAGNOSIS — Z5321 Procedure and treatment not carried out due to patient leaving prior to being seen by health care provider: Secondary | ICD-10-CM | POA: Diagnosis not present

## 2019-04-10 DIAGNOSIS — R109 Unspecified abdominal pain: Secondary | ICD-10-CM | POA: Insufficient documentation

## 2019-04-10 LAB — URINALYSIS, ROUTINE W REFLEX MICROSCOPIC
Bilirubin Urine: NEGATIVE
Glucose, UA: NEGATIVE mg/dL
Hgb urine dipstick: NEGATIVE
Ketones, ur: NEGATIVE mg/dL
Nitrite: NEGATIVE
Protein, ur: NEGATIVE mg/dL
Specific Gravity, Urine: 1.02 (ref 1.005–1.030)
pH: 6 (ref 5.0–8.0)

## 2019-04-10 LAB — PREGNANCY, URINE: Preg Test, Ur: NEGATIVE

## 2019-04-10 NOTE — ED Triage Notes (Signed)
Patient c/o LLQ abdominal pain and left flank pain for 3 days. Patient states she has had increased frequency and urgency with urination

## 2019-04-11 ENCOUNTER — Other Ambulatory Visit: Payer: Self-pay

## 2019-04-11 ENCOUNTER — Ambulatory Visit (HOSPITAL_COMMUNITY)
Admission: EM | Admit: 2019-04-11 | Discharge: 2019-04-11 | Disposition: A | Discharge status: Home / Self Care | Payer: Medicaid Other | Attending: Emergency Medicine | Admitting: Emergency Medicine

## 2019-04-11 ENCOUNTER — Encounter (HOSPITAL_COMMUNITY): Payer: Self-pay

## 2019-04-11 DIAGNOSIS — R1032 Left lower quadrant pain: Secondary | ICD-10-CM | POA: Diagnosis not present

## 2019-04-11 DIAGNOSIS — Z3202 Encounter for pregnancy test, result negative: Secondary | ICD-10-CM | POA: Diagnosis not present

## 2019-04-11 LAB — POCT URINALYSIS DIP (DEVICE)
Bilirubin Urine: NEGATIVE
Glucose, UA: NEGATIVE mg/dL
Hgb urine dipstick: NEGATIVE
Ketones, ur: NEGATIVE mg/dL
Leukocytes,Ua: NEGATIVE
Nitrite: NEGATIVE
Protein, ur: NEGATIVE mg/dL
Specific Gravity, Urine: 1.02 (ref 1.005–1.030)
Urobilinogen, UA: 0.2 mg/dL (ref 0.0–1.0)
pH: 7 (ref 5.0–8.0)

## 2019-04-11 LAB — POC URINE PREG, ED
Preg Test, Ur: NEGATIVE
Preg Test, Ur: NEGATIVE

## 2019-04-11 LAB — POCT PREGNANCY, URINE: Preg Test, Ur: NEGATIVE

## 2019-04-11 MED ORDER — POLYETHYLENE GLYCOL 3350 17 G PO PACK: 17.0000 g | PACK | Freq: Every day | ORAL | 0 refills | Status: DC

## 2019-04-11 MED ORDER — DOCUSATE SODIUM 100 MG PO CAPS: 100.0000 mg | ORAL_CAPSULE | Freq: Two times a day (BID) | ORAL | 0 refills | Status: DC

## 2019-04-11 NOTE — Discharge Instructions (Addendum)
Please use Miralax for moderate to severe constipation. Take this once a day for the next 2-3 days. Please also start docusate stool softener, twice a day for at least 1 week. If stools become loose, cut down to once a day for another week. If stools remain loose, cut back to 1 pill every other day for a third week. You can stop docusate thereafter and resume as needed for constipation.  May try magnesium citrate, or milk of magneiusm  To help reduce constipation and promote bowel health: 1. Drink at least 64 ounces of water each day 2. Eat plenty of fiber (fruits, vegetables, whole grains, legumes) 3. Be physically active or exercise including walking, jogging, swimming, yoga, etc. 4. For active constipation use a stool softener (docusate) or an osmotic laxative (like Miralax) each day, or as needed.

## 2019-04-11 NOTE — ED Triage Notes (Addendum)
Pt state states she has pressure when she voids. X 6 days. Pt states she was at th ER last night this pressure was so bad. Pt LWBS.

## 2019-04-12 LAB — URINE CULTURE

## 2019-04-12 NOTE — ED Provider Notes (Signed)
[facility]    CSN: [id number] Arrival date & time: 04/11/19  1655      History   Chief Complaint Chief Complaint  Patient presents with  . Urinary Tract Infection    HPI Jackie Smith is a 34 y.o. female history of GERD, bipolar type I, frequent UTI/kidney infections, presenting today for evaluation of possible UTI.  Patient notes that over the past 5 days she has had discomfort in her left side and in her left lower abdomen.  She is concerned about possible kidney infection.  She denies any associated urinary symptoms of dysuria, increased frequency urgency or hematuria.  She does feel a pressure in her lower abdomen with voiding.  Has started to feel slightly nauseous today, denies any known fevers.  Denies history of kidney stones.  Pain has been constant.  Also reports feeling slightly constipated and smaller harder bowels since symptoms began.  Denies any other pelvic symptoms of abnormal discharge, abnormal bleeding, itching or irritation.  No longer getting menstrual cycles, has had prior ablation.  On chart review patient previously went to emergency room for symptoms, left before being seen.  Had UA performed which had trace leuks, rare bacteria.  HPI  Past Medical History:  Diagnosis Date  . Acid reflux   . Bipolar 1 disorder ([place])   . Frequent UTI   . Gastritis   . History of benign breast tumor   . Infections of kidney   . Irritable bowel syndrome (IBS)   . Thyroid disease   . Vaginal Pap smear, abnormal     Patient Active Problem List   Diagnosis Date Noted  . Major depression 05/15/2014    Past Surgical History:  Procedure Laterality Date  . c sections    . DILATION AND CURETTAGE OF UTERUS    . INDUCED ABORTION    . LASER ABLATION OF THE CERVIX    . TUBAL LIGATION      OB History    Gravida  5   Para  4   Term  3   Preterm  1   AB  1   Living  3     SAB  0   TAB  1   Ectopic      Multiple      Live Births                 Home Medications    Prior to Admission medications   Medication Sig Start Date End Date Taking? Authorizing Provider  albuterol (PROVENTIL HFA;VENTOLIN HFA) 108 (90 Base) MCG/ACT inhaler Inhale into the lungs every 6 (six) hours as needed for wheezing or shortness of breath.    [provider]  Cholecalciferol (VITAMIN D3) 5000 units CAPS Take 1 capsule by mouth daily.    [provider]  docusate sodium (COLACE) 100 MG capsule Take 1 capsule (100 mg total) by mouth every 12 (twelve) hours. 04/11/19   Olegario Emberson C, PA-C  lamoTRIgine (LAMICTAL) 100 MG tablet Take 100 mg by mouth every morning.     [provider]  Omega-3 Fatty Acids (FISH OIL) 1000 MG CAPS Take 1 capsule by mouth daily.    [provider]  polyethylene glycol (MIRALAX / GLYCOLAX) 17 g packet Take 17 g by mouth daily. 04/11/19   Anaissa Macfadden C, PA-C  thyroid (NP THYROID) 60 MG tablet Take 1 tablet (60 mg total) by mouth daily before breakfast. 10/03/18   Doree Albee, MD  Family History Family History  Problem Relation Age of Onset  . Drug abuse Mother   . Cancer Mother        cervix  . Bipolar disorder Father   . Drug abuse Father   . Heart disease Other   . Arthritis Other   . Cancer Other   . Asthma Other   . Diabetes Other   . Depression Sister   . Anxiety disorder Sister   . Bipolar disorder Paternal Uncle     Social History Social History   Tobacco Use  . Smoking status: Former Smoker    Packs/day: 0.25    Types: Cigarettes    Quit date: 04/26/2014    Years since quitting: 4.9  . Smokeless tobacco: Never Used  Substance Use Topics  . Alcohol use: No  . Drug use: No     Allergies   Imitrex [sumatriptan base]   Review of Systems Review of Systems  Constitutional: Negative for fever.  Respiratory: Negative for shortness of breath.   Cardiovascular: Negative for chest pain.  Gastrointestinal: Positive for abdominal pain and  nausea. Negative for diarrhea and vomiting.  Genitourinary: Positive for flank pain. Negative for dysuria, frequency, genital sores, hematuria, menstrual problem, vaginal bleeding, vaginal discharge and vaginal pain.  Musculoskeletal: Negative for back pain.  Skin: Negative for rash.  Neurological: Negative for dizziness, light-headedness and headaches.     Physical Exam Triage Vital Signs ED Triage Vitals [04/11/19 1814]  Enc Vitals Group     BP (!) 152/94     Pulse Rate 76     Resp 16     Temp 98.4 F (36.9 C)     Temp Source Oral     SpO2 99 %     Weight 230 lb (104.3 kg)     Height      Head Circumference      Peak Flow      Pain Score 8     Pain Loc      Pain Edu?      Excl. in [place]?    No data found.  Updated Vital Signs BP (!) 152/94 (BP Location: Left Arm)   Pulse 76   Temp 98.4 F (36.9 C) (Oral)   Resp 16   Wt 230 lb (104.3 kg)   SpO2 99%   BMI 39.48 kg/m   Visual Acuity Right Eye Distance:   Left Eye Distance:   Bilateral Distance:    Right Eye Near:   Left Eye Near:    Bilateral Near:     Physical Exam Vitals and nursing note reviewed.  Constitutional:      General: She is not in acute distress.    Appearance: She is well-developed.  HENT:     Head: Normocephalic and atraumatic.  Eyes:     Conjunctiva/sclera: Conjunctivae normal.  Cardiovascular:     Rate and Rhythm: Normal rate and regular rhythm.     Heart sounds: No murmur.  Pulmonary:     Effort: Pulmonary effort is normal. No respiratory distress.  Abdominal:     Palpations: Abdomen is soft.     Tenderness: There is abdominal tenderness.     Comments: Soft, nondistended, tender to palpation in left lower quadrant, negative rebound, negative Rovsing, negative McBurney's  No CVA tenderness  Musculoskeletal:     Cervical back: Neck supple.  Skin:    General: Skin is warm and dry.  Neurological:     Mental Status: She is alert.  UC Treatments / Results  Labs (all labs  ordered are listed, but only abnormal results are displayed) Labs Reviewed  URINE CULTURE  POC URINE PREG, ED  POCT URINALYSIS DIP (DEVICE)  POCT PREGNANCY, URINE  POC URINE PREG, ED    EKG   Radiology No results found.  Procedures Procedures (including critical care time)  Medications Ordered in UC Medications - No data to display  Initial Impression / Assessment and Plan / UC Course  I have reviewed the triage vital signs and the nursing notes.  Pertinent labs & imaging results that were available during my care of the patient were reviewed by me and considered in my medical decision making (see chart for details).     UA today with negative leuks and nitrites, no hemoglobin, completely unremarkable.  Given her history we will still send for urine culture to completely rule out UTI.  Given location of discomfort and associated decreased bowels discomfort likely from constipation.  Discussed constipation measures to try as well.  Monitor for pain to improve with more regular bowels passing.  Do not suspect abdominal emergency at this time, negative peritoneal signs.  Discussed strict return precautions. Patient verbalized understanding and is agreeable with plan.  Final Clinical Impressions(s) / UC Diagnoses   Final diagnoses:  Left lower quadrant abdominal pain     Discharge Instructions     Please use Miralax for moderate to severe constipation. Take this once a day for the next 2-3 days. Please also start docusate stool softener, twice a day for at least 1 week. If stools become loose, cut down to once a day for another week. If stools remain loose, cut back to 1 pill every other day for a third week. You can stop docusate thereafter and resume as needed for constipation.  May try magnesium citrate, or milk of magneiusm  To help reduce constipation and promote bowel health: 1. Drink at least 64 ounces of water each day 2. Eat plenty of fiber (fruits, vegetables,  whole grains, legumes) 3. Be physically active or exercise including walking, jogging, swimming, yoga, etc. 4. For active constipation use a stool softener (docusate) or an osmotic laxative (like Miralax) each day, or as needed.   ED Prescriptions    Medication Sig Dispense Auth. Provider   polyethylene glycol (MIRALAX / GLYCOLAX) 17 g packet Take 17 g by mouth daily. 14 each Arion Morgan C, PA-C   docusate sodium (COLACE) 100 MG capsule Take 1 capsule (100 mg total) by mouth every 12 (twelve) hours. 20 capsule Zoanne Newill, [place] C, PA-C     PDMP not reviewed this encounter.   Janith Lima, PA-C 04/12/19 1038

## 2019-04-16 ENCOUNTER — Ambulatory Visit (INDEPENDENT_AMBULATORY_CARE_PROVIDER_SITE_OTHER): Payer: Medicaid Other | Admitting: Nurse Practitioner

## 2019-04-24 DIAGNOSIS — Z20828 Contact with and (suspected) exposure to other viral communicable diseases: Secondary | ICD-10-CM | POA: Diagnosis not present

## 2019-05-01 ENCOUNTER — Other Ambulatory Visit: Payer: Self-pay

## 2019-05-01 ENCOUNTER — Ambulatory Visit (INDEPENDENT_AMBULATORY_CARE_PROVIDER_SITE_OTHER): Payer: Medicaid Other | Admitting: Nurse Practitioner

## 2019-05-01 ENCOUNTER — Encounter (INDEPENDENT_AMBULATORY_CARE_PROVIDER_SITE_OTHER): Payer: Self-pay | Admitting: Nurse Practitioner

## 2019-05-01 VITALS — BP 112/70 | HR 97 | Temp 97.2°F | Resp 19 | Ht 64.0 in | Wt 265.2 lb

## 2019-05-01 DIAGNOSIS — E559 Vitamin D deficiency, unspecified: Secondary | ICD-10-CM | POA: Diagnosis not present

## 2019-05-01 DIAGNOSIS — Z131 Encounter for screening for diabetes mellitus: Secondary | ICD-10-CM

## 2019-05-01 DIAGNOSIS — J4520 Mild intermittent asthma, uncomplicated: Secondary | ICD-10-CM | POA: Diagnosis not present

## 2019-05-01 DIAGNOSIS — Z139 Encounter for screening, unspecified: Secondary | ICD-10-CM

## 2019-05-01 DIAGNOSIS — F331 Major depressive disorder, recurrent, moderate: Secondary | ICD-10-CM | POA: Diagnosis not present

## 2019-05-01 DIAGNOSIS — E039 Hypothyroidism, unspecified: Secondary | ICD-10-CM

## 2019-05-01 DIAGNOSIS — Z0001 Encounter for general adult medical examination with abnormal findings: Secondary | ICD-10-CM | POA: Diagnosis not present

## 2019-05-01 DIAGNOSIS — Z13220 Encounter for screening for lipoid disorders: Secondary | ICD-10-CM

## 2019-05-01 MED ORDER — ALBUTEROL SULFATE HFA 108 (90 BASE) MCG/ACT IN AERS: 2.0000 | INHALATION_SPRAY | Freq: Four times a day (QID) | RESPIRATORY_TRACT | 2 refills | Status: DC | PRN

## 2019-05-01 NOTE — Progress Notes (Signed)
Subjective:  Patient ID: Jackie Smith, female    DOB: 1985-11-18  Age: 34 y.o. MRN: [id number]  CC:  Chief Complaint  Patient presents with  . Medication Refill  . Annual Exam      HPI  This patient arrives today for annual physical exam.  She is due for her tetanus shot which will be administered today.  She has not gotten flu or Covid vaccine administered at this time.  She tells me she is not interested in this getting administered currently.  Last Pap smear was completed in February 2020.  She is status post tubal ligation so she should not require folic acid supplement for prevention of neural tube defects pregnancy, she has declined to get sexual transmitted infection screening today, she quit smoking tobacco in 2016 and tells me she is not smoking currently, she is due for depression screening, per USPS TF new guidelines she would be recommended to undergo hepatitis C screening.  She denies any known contacts hepatitis C, history of IV drug use, or history of multiple sex partners.  Her main concern today is that she has a history of hypothyroidism and has been out of her thyroid medication for quite some time now.  She has been feeling quite fatigued and has been sleeping a lot.  She also is concerned about her weight gain.   Past Medical History:  Diagnosis Date  . Acid reflux   . Bipolar 1 disorder ([place])   . Frequent UTI   . Gastritis   . History of benign breast tumor   . Infections of kidney   . Irritable bowel syndrome (IBS)   . Thyroid disease   . Vaginal Pap smear, abnormal       Family History  Problem Relation Age of Onset  . Drug abuse Mother   . Cancer Mother        cervix  . Bipolar disorder Father   . Drug abuse Father   . Heart disease Other   . Arthritis Other   . Cancer Other   . Asthma Other   . Diabetes Other   . Depression Sister   . Anxiety disorder Sister   . Bipolar disorder Paternal Uncle     Social History    Social History Narrative  . Not on file   Social History   Tobacco Use  . Smoking status: Former Smoker    Packs/day: 0.25    Types: Cigarettes    Quit date: 04/26/2014    Years since quitting: 5.0  . Smokeless tobacco: Never Used  Substance Use Topics  . Alcohol use: No     Current Meds  Medication Sig  . Cholecalciferol (VITAMIN D3) 5000 units CAPS Take 1 capsule by mouth daily.  . Omega-3 Fatty Acids (FISH OIL) 1000 MG CAPS Take 1 capsule by mouth daily.  . polyethylene glycol (MIRALAX / GLYCOLAX) 17 g packet Take 17 g by mouth daily.  Marland Kitchen thyroid (NP THYROID) 60 MG tablet Take 1 tablet (60 mg total) by mouth daily before breakfast.  . [DISCONTINUED] albuterol (PROVENTIL HFA;VENTOLIN HFA) 108 (90 Base) MCG/ACT inhaler Inhale into the lungs every 6 (six) hours as needed for wheezing or shortness of breath.  . [DISCONTINUED] docusate sodium (COLACE) 100 MG capsule Take 1 capsule (100 mg total) by mouth every 12 (twelve) hours.  . [DISCONTINUED] lamoTRIgine (LAMICTAL) 100 MG tablet Take 100 mg by mouth every morning.   Marland Kitchen albuterol (VENTOLIN HFA) 108 (90 Base) MCG/ACT  inhaler Inhale 2 puffs into the lungs every 6 (six) hours as needed for wheezing or shortness of breath.    ROS:  Review of Systems  Constitutional: Positive for malaise/fatigue. Negative for fever and weight loss.  Eyes: Negative for blurred vision and double vision.  Respiratory: Positive for cough and wheezing. Negative for shortness of breath.   Cardiovascular: Negative for chest pain and palpitations.  Gastrointestinal: Negative for abdominal pain and blood in stool.  Neurological: Positive for dizziness (especially with hunger). Negative for headaches.  Psychiatric/Behavioral: Positive for depression. Negative for suicidal ideas.     Objective:   Today's Vitals: BP 112/70   Pulse 97   Temp (!) 97.2 F (36.2 C) (Temporal)   Resp 19   Ht 5' 4"  (1.626 m)   Wt 265 lb 3.2 oz (120.3 kg)   SpO2 98%   BMI  45.52 kg/m  Vitals with BMI 05/01/2019 05/01/2019 04/11/2019  Height - 5' 4"  -  Weight - 265 lbs 3 oz 230 lbs  BMI - 70.6 23.76  Systolic 283 151 761  Diastolic 70 90 94  Pulse - 97 76  Some encounter information is confidential and restricted. Go to Review Flowsheets activity to see all data.     Physical Exam Vitals reviewed.  Constitutional:      Appearance: Normal appearance. She is obese.  HENT:     Head: Normocephalic and atraumatic.     Right Ear: Tympanic membrane, ear canal and external ear normal.     Left Ear: Tympanic membrane, ear canal and external ear normal.  Eyes:     General:        Right eye: No discharge.        Left eye: No discharge.     Extraocular Movements: Extraocular movements intact.     Conjunctiva/sclera: Conjunctivae normal.     Pupils: Pupils are equal, round, and reactive to light.  Neck:     Vascular: No carotid bruit.  Cardiovascular:     Rate and Rhythm: Normal rate and regular rhythm.     Pulses: Normal pulses.     Heart sounds: Normal heart sounds. No murmur.  Pulmonary:     Effort: Pulmonary effort is normal.     Breath sounds: Normal breath sounds.  Chest:     Breasts: Breasts are symmetrical.        Right: Normal.        Left: Normal.  Abdominal:     General: Abdomen is flat. Bowel sounds are normal. There is no distension.     Palpations: Abdomen is soft. There is no mass.     Tenderness: There is no abdominal tenderness. There is no guarding.  Musculoskeletal:        General: No tenderness.     Cervical back: Neck supple. No muscular tenderness.     Right lower leg: No edema.     Left lower leg: No edema.  Lymphadenopathy:     Cervical: No cervical adenopathy.     Upper Body:     Right upper body: No supraclavicular adenopathy.     Left upper body: No supraclavicular adenopathy.  Skin:    General: Skin is warm and dry.  Neurological:     General: No focal deficit present.     Mental Status: She is alert and oriented to  person, place, and time.     Motor: No weakness.     Gait: Gait normal.  Psychiatric:  Mood and Affect: Mood normal.        Behavior: Behavior normal.        Judgment: Judgment normal.       PHQ9 SCORE ONLY 05/01/2019 03/07/2018  Score 10 0      Assessment and Plan   1. Encounter for general adult medical examination with abnormal findings   2. Hypothyroidism, unspecified type   3. Screening for condition   4. Morbid obesity ([place])   5. Lipid screening   6. Screening for diabetes mellitus   7. Mild intermittent asthma, unspecified whether complicated   8. Vitamin D deficiency   9. Moderate episode of recurrent major depressive disorder ([place])      Plan: 1.,  3.-6.,  8.  We will collect blood work for further evaluation today she will continue on her vitamin D3 supplement for now.  2., 9.  Alcohol thyroid panel for further evaluation today.  Her PHQ-9 score was positive for depression today.  I do think she may have some underlying depression but symptoms may be worsened due to her hypothyroidism being not treated currently.  We will await thyroid panel results and may need to restart her on her thyroid medication.  We will need to discuss further at next office visit.   7.  I will refill her albuterol inhaler that she can use as needed.  Tests ordered Orders Placed This Encounter  Procedures  . CBC  . CMP with eGFR([facility])  . Lipid Panel  . Hemoglobin A1c  . TSH  . T3, Free  . T4, Free  . Vitamin D, 25-hydroxy      Meds ordered this encounter  Medications  . albuterol (VENTOLIN HFA) 108 (90 Base) MCG/ACT inhaler    Sig: Inhale 2 puffs into the lungs every 6 (six) hours as needed for wheezing or shortness of breath.    Dispense:  8 g    Refill:  2    Order Specific Question:   Supervising Provider    Answer:   Doree Albee [[id number]]    Patient to follow-up in 4 to 6 weeks with Dr. Anastasio Champion for further discussion regarding her blood work, possibly  repeating thyroid panel assuming that we restart her thyroid medication, and discuss lifestyle to help with weight loss.  Ailene Ards, NP

## 2019-05-02 ENCOUNTER — Encounter (INDEPENDENT_AMBULATORY_CARE_PROVIDER_SITE_OTHER): Payer: Self-pay | Admitting: Nurse Practitioner

## 2019-05-02 DIAGNOSIS — E119 Type 2 diabetes mellitus without complications: Secondary | ICD-10-CM

## 2019-05-02 HISTORY — DX: Type 2 diabetes mellitus without complications: E11.9

## 2019-05-02 LAB — COMPLETE METABOLIC PANEL WITH GFR
AG Ratio: 1.6 (calc) (ref 1.0–2.5)
ALT: 47 U/L — ABNORMAL HIGH (ref 6–29)
AST: 28 U/L (ref 10–30)
Albumin: 4.4 g/dL (ref 3.6–5.1)
Alkaline phosphatase (APISO): 68 U/L (ref 31–125)
BUN: 13 mg/dL (ref 7–25)
CO2: 27 mmol/L (ref 20–32)
Calcium: 10.4 mg/dL — ABNORMAL HIGH (ref 8.6–10.2)
Chloride: 101 mmol/L (ref 98–110)
Creat: 0.86 mg/dL (ref 0.50–1.10)
GFR, Est African American: 101 mL/min/{1.73_m2} (ref >=60)
GFR, Est Non African American: 87 mL/min/{1.73_m2} (ref >=60)
Globulin: 2.7 g/dL (calc) (ref 1.9–3.7)
Glucose, Bld: 106 mg/dL — ABNORMAL HIGH (ref 65–99)
Potassium: 4.4 mmol/L (ref 3.5–5.3)
Sodium: 136 mmol/L (ref 135–146)
Total Bilirubin: 0.3 mg/dL (ref 0.2–1.2)
Total Protein: 7.1 g/dL (ref 6.1–8.1)

## 2019-05-02 LAB — CBC
HCT: 41.9 % (ref 35.0–45.0)
Hemoglobin: 14.1 g/dL (ref 11.7–15.5)
MCH: 30 pg (ref 27.0–33.0)
MCHC: 33.7 g/dL (ref 32.0–36.0)
MCV: 89.1 fL (ref 80.0–100.0)
MPV: 12.2 fL (ref 7.5–12.5)
Platelets: 223 10*3/uL (ref 140–400)
RBC: 4.7 10*6/uL (ref 3.80–5.10)
RDW: 14.5 % (ref 11.0–15.0)
WBC: 6.7 10*3/uL (ref 3.8–10.8)

## 2019-05-02 LAB — HEMOGLOBIN A1C
Hgb A1c MFr Bld: 6.7 % of total Hgb — ABNORMAL HIGH (ref <5.7)
Mean Plasma Glucose: 146 (calc)
eAG (mmol/L): 8.1 (calc)

## 2019-05-02 LAB — LIPID PANEL
Cholesterol: 216 mg/dL — ABNORMAL HIGH (ref <200)
HDL: 46 mg/dL — ABNORMAL LOW (ref >=50)
LDL Cholesterol (Calc): 143 mg/dL (calc) — ABNORMAL HIGH
Non-HDL Cholesterol (Calc): 170 mg/dL (calc) — ABNORMAL HIGH (ref <130)
Total CHOL/HDL Ratio: 4.7 (calc) (ref <5.0)
Triglycerides: 143 mg/dL (ref <150)

## 2019-05-02 LAB — VITAMIN D 25 HYDROXY (VIT D DEFICIENCY, FRACTURES): Vit D, 25-Hydroxy: 55 ng/mL (ref 30–100)

## 2019-05-02 LAB — T3, FREE: T3, Free: 3 pg/mL (ref 2.3–4.2)

## 2019-05-02 LAB — T4, FREE: Free T4: 1 ng/dL (ref 0.8–1.8)

## 2019-05-02 LAB — TSH: TSH: 2.78 mIU/L

## 2019-06-03 DIAGNOSIS — Z20828 Contact with and (suspected) exposure to other viral communicable diseases: Secondary | ICD-10-CM | POA: Diagnosis not present

## 2019-06-12 ENCOUNTER — Ambulatory Visit (INDEPENDENT_AMBULATORY_CARE_PROVIDER_SITE_OTHER): Payer: Medicaid Other | Admitting: Internal Medicine

## 2019-07-23 DIAGNOSIS — F331 Major depressive disorder, recurrent, moderate: Secondary | ICD-10-CM | POA: Diagnosis not present

## 2019-07-23 DIAGNOSIS — F3130 Bipolar disorder, current episode depressed, mild or moderate severity, unspecified: Secondary | ICD-10-CM | POA: Diagnosis not present

## 2019-07-23 DIAGNOSIS — F411 Generalized anxiety disorder: Secondary | ICD-10-CM | POA: Diagnosis not present

## 2019-07-23 DIAGNOSIS — Z79899 Other long term (current) drug therapy: Secondary | ICD-10-CM | POA: Diagnosis not present

## 2019-08-22 ENCOUNTER — Encounter (HOSPITAL_COMMUNITY): Payer: Self-pay | Admitting: *Deleted

## 2019-08-22 ENCOUNTER — Other Ambulatory Visit: Payer: Self-pay

## 2019-08-22 DIAGNOSIS — H538 Other visual disturbances: Secondary | ICD-10-CM | POA: Diagnosis not present

## 2019-08-22 DIAGNOSIS — Z5321 Procedure and treatment not carried out due to patient leaving prior to being seen by health care provider: Secondary | ICD-10-CM | POA: Diagnosis not present

## 2019-08-22 DIAGNOSIS — R6883 Chills (without fever): Secondary | ICD-10-CM | POA: Insufficient documentation

## 2019-08-22 DIAGNOSIS — R202 Paresthesia of skin: Secondary | ICD-10-CM | POA: Diagnosis not present

## 2019-08-22 DIAGNOSIS — R093 Abnormal sputum: Secondary | ICD-10-CM | POA: Insufficient documentation

## 2019-08-22 DIAGNOSIS — Z1152 Encounter for screening for COVID-19: Secondary | ICD-10-CM | POA: Diagnosis not present

## 2019-08-22 DIAGNOSIS — R0602 Shortness of breath: Secondary | ICD-10-CM | POA: Insufficient documentation

## 2019-08-22 DIAGNOSIS — R519 Headache, unspecified: Secondary | ICD-10-CM | POA: Diagnosis present

## 2019-08-22 DIAGNOSIS — R05 Cough: Secondary | ICD-10-CM | POA: Diagnosis not present

## 2019-08-22 LAB — CBG MONITORING, ED: Glucose-Capillary: 88 mg/dL (ref 70–99)

## 2019-08-22 NOTE — ED Triage Notes (Signed)
Pt c/o headache, blurry vision, chills, tingling in legs, sob, cough that is productive with yellow sputum that started 24 hours ago, pt states that she has been exposed to covid 3 days ago,

## 2019-08-23 ENCOUNTER — Other Ambulatory Visit: Payer: Self-pay

## 2019-08-23 ENCOUNTER — Emergency Department (HOSPITAL_COMMUNITY)
Admission: EM | Admit: 2019-08-23 | Discharge: 2019-08-23 | Disposition: A | Discharge status: Home / Self Care | Payer: No Typology Code available for payment source | Attending: Emergency Medicine | Admitting: Emergency Medicine

## 2019-08-23 ENCOUNTER — Encounter (HOSPITAL_COMMUNITY): Payer: Self-pay | Admitting: Emergency Medicine

## 2019-08-23 ENCOUNTER — Emergency Department (HOSPITAL_COMMUNITY)
Admission: EM | Admit: 2019-08-23 | Discharge: 2019-08-23 | Disposition: A | Discharge status: Home / Self Care | Payer: No Typology Code available for payment source | Source: Home / Self Care

## 2019-08-23 DIAGNOSIS — R0602 Shortness of breath: Secondary | ICD-10-CM | POA: Insufficient documentation

## 2019-08-23 DIAGNOSIS — Z20822 Contact with and (suspected) exposure to covid-19: Secondary | ICD-10-CM | POA: Insufficient documentation

## 2019-08-23 DIAGNOSIS — R05 Cough: Secondary | ICD-10-CM | POA: Insufficient documentation

## 2019-08-23 DIAGNOSIS — M791 Myalgia, unspecified site: Secondary | ICD-10-CM | POA: Insufficient documentation

## 2019-08-23 DIAGNOSIS — R509 Fever, unspecified: Secondary | ICD-10-CM | POA: Insufficient documentation

## 2019-08-23 DIAGNOSIS — R0981 Nasal congestion: Secondary | ICD-10-CM | POA: Insufficient documentation

## 2019-08-23 DIAGNOSIS — Z5321 Procedure and treatment not carried out due to patient leaving prior to being seen by health care provider: Secondary | ICD-10-CM | POA: Insufficient documentation

## 2019-08-23 NOTE — ED Triage Notes (Signed)
Pt reports temp that started x 24 hours ago. Last temp was 100.8 x 1 hour ago. Cough , nasal congestion, body aches, chills and shortness of breath.

## 2019-08-26 ENCOUNTER — Telehealth: Payer: Self-pay | Admitting: *Deleted

## 2019-08-26 NOTE — Telephone Encounter (Signed)
Contacted pt to complete transition of care assessment; she states she was never seen at [facility] because she was there 5 1/2 hours without being seen. Pt in [facility] ED 08/23/19 [phone] -[id number], and 08/22/19 2256 - 08/23/19 at 1502.  Lenor Coffin, RN, BSN, [place] Patient [place] [phone]

## 2019-08-26 NOTE — Telephone Encounter (Signed)
Jackie Smith presented to the ED and left before being seen by the provider on 08/23/19. The patient has been enrolled in an automated general discharge outreach program and 2 attempts to contact the patient will be made to follow up on their ED visit and subsequent needs. The care management team is available to provide assistance to this patient at any time.   Lenor Coffin, RN, BSN, [place] Patient [place] [phone]

## 2019-09-11 ENCOUNTER — Telehealth (INDEPENDENT_AMBULATORY_CARE_PROVIDER_SITE_OTHER): Payer: Self-pay

## 2019-09-11 NOTE — Telephone Encounter (Signed)
I think we are close to dismissing this patient from the practice because she has not really kept any appointments on a regular basis.  If he wants to go to the urgent care, that is fine.  I would call her back and make a follow-up appointment with me in 2 months time.  If she does not want to keep this appointment, let me know and we will dismiss her from the practice.

## 2019-09-11 NOTE — Telephone Encounter (Signed)
Okay; thanks.

## 2019-09-11 NOTE — Telephone Encounter (Signed)
Can you call and make a appointment to see Dr Darnell Level Thursday? Thank you

## 2019-09-11 NOTE — Telephone Encounter (Signed)
Just called patient to let her know to come in for an office visit at 11:45 tomorrow and she replied that she wants to do a telemedicine visit because she was exposed to [place] again on Sat 09/07/19 because she took her dad to the hospital.  I tried to explain to her that since it has been more than 2 weeks since she was positive that it was safe to come into the office.  She said if she can't do a telemedicine then she will just go to the urgent care because she didn't have time for all that and coming into the office. Please advise on what you want me to tell patient.

## 2019-09-11 NOTE — Telephone Encounter (Signed)
Schedule is booked for [facility] 09/12/19, can you look at schedule and let me know what time you want her to be worked in.

## 2019-09-11 NOTE — Telephone Encounter (Signed)
11:45 AM

## 2019-09-11 NOTE — Telephone Encounter (Signed)
She will need to be seen and since she had Covid positive test more than 2 weeks ago, we should be able to see her in the office tomorrow.  Please make her an appointment to be seen tomorrow.

## 2019-09-11 NOTE — Telephone Encounter (Signed)
Patient said she was going to try and go to urgent care and I set her up a follow up appointment for the end of Oct.

## 2019-09-12 ENCOUNTER — Ambulatory Visit (INDEPENDENT_AMBULATORY_CARE_PROVIDER_SITE_OTHER): Payer: Medicaid Other | Admitting: Internal Medicine

## 2019-11-13 ENCOUNTER — Ambulatory Visit (INDEPENDENT_AMBULATORY_CARE_PROVIDER_SITE_OTHER): Payer: Medicaid Other | Admitting: Internal Medicine

## 2019-11-13 ENCOUNTER — Other Ambulatory Visit (INDEPENDENT_AMBULATORY_CARE_PROVIDER_SITE_OTHER): Payer: Self-pay | Admitting: Internal Medicine

## 2019-11-14 ENCOUNTER — Ambulatory Visit
Admission: EM | Admit: 2019-11-14 | Discharge: 2019-11-14 | Disposition: A | Discharge status: Home / Self Care | Payer: No Typology Code available for payment source | Attending: Emergency Medicine | Admitting: Emergency Medicine

## 2019-11-14 ENCOUNTER — Other Ambulatory Visit: Payer: Self-pay

## 2019-11-14 DIAGNOSIS — M545 Low back pain, unspecified: Secondary | ICD-10-CM | POA: Insufficient documentation

## 2019-11-14 DIAGNOSIS — N390 Urinary tract infection, site not specified: Secondary | ICD-10-CM | POA: Diagnosis not present

## 2019-11-14 LAB — POCT URINALYSIS DIP (MANUAL ENTRY)
Bilirubin, UA: NEGATIVE
Glucose, UA: NEGATIVE mg/dL
Ketones, POC UA: NEGATIVE mg/dL
Nitrite, UA: NEGATIVE
Protein Ur, POC: 100 mg/dL — AB
Spec Grav, UA: 1.02 (ref 1.010–1.025)
Urobilinogen, UA: 0.2 E.U./dL
pH, UA: 7 (ref 5.0–8.0)

## 2019-11-14 MED ORDER — NITROFURANTOIN MONOHYD MACRO 100 MG PO CAPS: 100.0000 mg | ORAL_CAPSULE | Freq: Two times a day (BID) | ORAL | 0 refills | Status: DC

## 2019-11-14 NOTE — Discharge Instructions (Addendum)
Urine culture sent.  We will call you with the results.   Push fluids and get plenty of rest.   Take antibiotic as directed and to completion Take OTC Tylenol/ibuprofen as needed for back pain  follow up with PCP if symptoms persists Return here or go to ER if you have any new or worsening symptoms such as fever, worsening abdominal pain, nausea/vomiting, flank pain, etc..Marland Kitchen

## 2019-11-14 NOTE — ED Triage Notes (Signed)
Pt presents with c/o back pain , concerned with kidney Jackie Smith, pain began yesterday

## 2019-11-14 NOTE — ED Provider Notes (Signed)
[place]   Chief Complaint  Patient presents with   Back Pain     SUBJECTIVE:  Jackie Smith is a 34 y.o. female who presented to the urgent care for complaint of back pain that started yesterday.  Patient denies a precipitating event, trauma or injury.  Localizes the pain to the left  lower back.  Pain is intermittent and described as achy.  Has tried OTC medications without relief.  Symptoms are made worse with urination.  Admits to similar symptoms in the past.  Denies fever, chills, nausea, vomiting, abdominal pain, flank pain, abnormal vaginal discharge or bleeding, hematuria.    LMP: No LMP recorded. Patient has had an ablation.  ROS: As in HPI.  All other pertinent ROS negative.     Past Medical History:  Diagnosis Date   Acid reflux    Bipolar 1 disorder ([facility])    Frequent UTI    Gastritis    History of benign breast tumor    Infections of kidney    Irritable bowel syndrome (IBS)    Thyroid disease    Type 2 diabetes mellitus ([place]) 05/02/2019   Vaginal Pap smear, abnormal    Past Surgical History:  Procedure Laterality Date   c sections     DILATION AND CURETTAGE OF UTERUS     INDUCED ABORTION     LASER ABLATION OF THE CERVIX     TUBAL LIGATION     Allergies  Allergen Reactions   Imitrex [Sumatriptan Base] Nausea And Vomiting   No current facility-administered medications on file prior to encounter.   Current Outpatient Medications on File Prior to Encounter  Medication Sig Dispense Refill   albuterol (VENTOLIN HFA) 108 (90 Base) MCG/ACT inhaler Inhale 2 puffs into the lungs every 6 (six) hours as needed for wheezing or shortness of breath. 8 g 2   Cholecalciferol (VITAMIN D3) 5000 units CAPS Take 1 capsule by mouth daily.     Omega-3 Fatty Acids (FISH OIL) 1000 MG CAPS Take 1 capsule by mouth daily.     polyethylene glycol (MIRALAX / GLYCOLAX) 17 g packet Take 17 g by mouth daily. 14 each 0   thyroid (NP THYROID)  60 MG tablet Take 1 tablet (60 mg total) by mouth daily before breakfast. 30 tablet 3   Social History   Socioeconomic History   Marital status: Married    Spouse name: Not on file   Number of children: Not on file   Years of education: college   Highest education level: Not on file  Occupational History   Occupation: call center office    Employer: [employer]  Tobacco Use   Smoking status: Former Smoker    Packs/day: 0.25    Types: Cigarettes    Quit date: 04/26/2014    Years since quitting: 5.5   Smokeless tobacco: Never Used  Vaping Use   Vaping Use: Never used  Substance and Sexual Activity   Alcohol use: No   Drug use: No   Sexual activity: Yes    Birth control/protection: None, Surgical    Comment: tubal   Other Topics Concern   Not on file  Social History Narrative   Not on file   Social Determinants of Health   Financial Resource Strain:    Difficulty of Paying Living Expenses: Not on file  Food Insecurity:    Worried About [place] in the Last Year: Not on file   [employer] of Food in  the Last Year: Not on file  Transportation Needs:    Lack of Transportation (Medical): Not on file   Lack of Transportation (Non-Medical): Not on file  Physical Activity:    Days of Exercise per Week: Not on file   Minutes of Exercise per Session: Not on file  Stress:    Feeling of Stress : Not on file  Social Connections:    Frequency of Communication with Friends and Family: Not on file   Frequency of Social Gatherings with Friends and Family: Not on file   Attends Religious Services: Not on file   Active Member of Clubs or Organizations: Not on file   Attends Archivist Meetings: Not on file   Marital Status: Not on file  Intimate Partner Violence:    Fear of Current or Ex-Partner: Not on file   Emotionally Abused: Not on file   Physically Abused: Not on file   Sexually Abused: Not on file   Family History    Problem Relation Age of Onset   Drug abuse Mother    Cancer Mother        cervix   Bipolar disorder Father    Drug abuse Father    Heart disease Other    Arthritis Other    Cancer Other    Asthma Other    Diabetes Other    Depression Sister    Anxiety disorder Sister    Bipolar disorder Paternal Uncle     OBJECTIVE:  Vitals:   11/14/19 0953  BP: 120/82  Pulse: 84  Resp: 18  Temp: 98.7 F (37.1 C)  SpO2: 95%   Physical Exam Vitals and nursing note reviewed.  Constitutional:      General: She is not in acute distress.    Appearance: Normal appearance. She is normal weight. She is not ill-appearing, toxic-appearing or diaphoretic.  HENT:     Head: Normocephalic.  Cardiovascular:     Rate and Rhythm: Normal rate and regular rhythm.     Pulses: Normal pulses.     Heart sounds: Normal heart sounds. No murmur heard.  No friction rub. No gallop.   Pulmonary:     Effort: Pulmonary effort is normal. No respiratory distress.     Breath sounds: Normal breath sounds. No stridor. No wheezing, rhonchi or rales.  Chest:     Chest wall: No tenderness.  Musculoskeletal:        General: Tenderness present.     Comments: Back:  Patient ambulates from chair to exam table without difficulty.  Inspection: Skin clear and intact without obvious swelling, erythema, or ecchymosis. Warm to the touch  Palpation: Vertebral processes nontender. Tenderness about the lower left paravertebral muscles  ROM: FROM Strength: 5/5 hip flexion, 5/5 knee extension, 5/5 knee flexion, 5/5 plantar flexion, 5/5 dorsiflexion    Neurological:     Mental Status: She is alert and oriented to person, place, and time.     Labs Reviewed  POCT URINALYSIS DIP (MANUAL ENTRY) - Abnormal; Notable for the following components:      Result Value   Blood, UA moderate (*)    Protein Ur, POC =100 (*)    Leukocytes, UA Small (1+) (*)    All other components within normal limits  URINE CULTURE     ASSESSMENT & PLAN:  1. Acute left-sided low back pain without sciatica   2. Acute UTI     Meds ordered this encounter  Medications   nitrofurantoin, macrocrystal-monohydrate, (MACROBID) 100 MG capsule  Sig: Take 1 capsule (100 mg total) by mouth 2 (two) times daily.    Dispense:  10 capsule    Refill:  0   Discharge instructions  Urine culture sent.  We will call you with the results.   Push fluids and get plenty of rest.   Take antibiotic as directed and to completion Take OTC Tylenol/ibuprofen as needed for back pain  follow up with PCP if symptoms persists Return here or go to ER if you have any new or worsening symptoms such as fever, worsening abdominal pain, nausea/vomiting, flank pain, etc...  Outlined signs and symptoms indicating need for more acute intervention. Patient verbalized understanding. After Visit Summary given.     Emerson Monte, [place] 11/14/19 1025

## 2019-11-17 LAB — URINE CULTURE: Culture: >=100000 — AB

## 2020-03-05 ENCOUNTER — Ambulatory Visit
Admission: EM | Admit: 2020-03-05 | Discharge: 2020-03-05 | Disposition: A | Discharge status: Home / Self Care | Payer: No Typology Code available for payment source | Attending: Emergency Medicine | Admitting: Emergency Medicine

## 2020-03-05 ENCOUNTER — Encounter: Payer: Self-pay | Admitting: Emergency Medicine

## 2020-03-05 ENCOUNTER — Other Ambulatory Visit: Payer: Self-pay

## 2020-03-05 DIAGNOSIS — B9789 Other viral agents as the cause of diseases classified elsewhere: Secondary | ICD-10-CM

## 2020-03-05 DIAGNOSIS — Z1152 Encounter for screening for COVID-19: Secondary | ICD-10-CM | POA: Diagnosis not present

## 2020-03-05 DIAGNOSIS — J329 Chronic sinusitis, unspecified: Secondary | ICD-10-CM | POA: Diagnosis not present

## 2020-03-05 DIAGNOSIS — J029 Acute pharyngitis, unspecified: Secondary | ICD-10-CM

## 2020-03-05 MED ORDER — PREDNISONE 10 MG PO TABS: 20.0000 mg | ORAL_TABLET | Freq: Every day | ORAL | 0 refills | Status: DC

## 2020-03-05 MED ORDER — CETIRIZINE HCL 10 MG PO TABS: 10.0000 mg | ORAL_TABLET | Freq: Every day | ORAL | 0 refills | Status: DC

## 2020-03-05 MED ORDER — FLUTICASONE PROPIONATE 50 MCG/ACT NA SUSP: 1.0000 | Freq: Every day | NASAL | 0 refills | Status: DC

## 2020-03-05 MED ORDER — BENZONATATE 100 MG PO CAPS: 100.0000 mg | ORAL_CAPSULE | Freq: Three times a day (TID) | ORAL | 0 refills | Status: DC | PRN

## 2020-03-05 NOTE — Discharge Instructions (Signed)
COVID testing ordered.  It will take between 2-7 days for test results.  Someone will contact you regarding abnormal results.    Get plenty of rest and push fluids Tessalon Perles prescribed for cough  Zyrtec for nasal congestion, runny nose, and/or sore throat Flonase and prednisone were prescribed for middle ear effusion Use medications daily for symptom relief Use OTC medications like ibuprofen or tylenol as needed fever or pain Call or go to the ED if you have any new or worsening symptoms such as fever, worsening cough, shortness of breath, chest tightness, chest pain, turning blue, changes in mental status, etc..Marland Kitchen

## 2020-03-05 NOTE — ED Triage Notes (Signed)
Pt states she feels like her sinuses are swollen, reports she thinks she may have a sinus infection.

## 2020-03-05 NOTE — ED Provider Notes (Signed)
[place]   [id number] 03/05/20 Arrival Time: [place]   Chief Complaint  Patient presents with  . Sinus Problem     SUBJECTIVE: History from: patient.  Jackie Smith is a 35 y.o. female who presented to the urgent care for complaint of sinus pressure and sore throat for the past few days.  Denies sick exposure to COVID, flu or strep.  Denies recent travel.  Has tried OTC medication without relief. Denies alleviating or aggravating factors. Denies previous symptoms in the past.   Denies fever, chills, fatigue, sinus pain, rhinorrhea,  SOB, wheezing, chest pain, nausea, changes in bowel or bladder habits.      ROS: As per HPI.  All other pertinent ROS negative.     Past Medical History:  Diagnosis Date  . Acid reflux   . Bipolar 1 disorder ([place])   . Frequent UTI   . Gastritis   . History of benign breast tumor   . Infections of kidney   . Irritable bowel syndrome (IBS)   . Thyroid disease   . Type 2 diabetes mellitus ([place]) 05/02/2019  . Vaginal Pap smear, abnormal    Past Surgical History:  Procedure Laterality Date  . c sections    . DILATION AND CURETTAGE OF UTERUS    . INDUCED ABORTION    . LASER ABLATION OF THE CERVIX    . TUBAL LIGATION     Allergies  Allergen Reactions  . Imitrex [Sumatriptan Base] Nausea And Vomiting   No current facility-administered medications on file prior to encounter.   Current Outpatient Medications on File Prior to Encounter  Medication Sig Dispense Refill  . albuterol (VENTOLIN HFA) 108 (90 Base) MCG/ACT inhaler Inhale 2 puffs into the lungs every 6 (six) hours as needed for wheezing or shortness of breath. 8 g 2  . Cholecalciferol (VITAMIN D3) 5000 units CAPS Take 1 capsule by mouth daily.    . nitrofurantoin, macrocrystal-monohydrate, (MACROBID) 100 MG capsule Take 1 capsule (100 mg total) by mouth 2 (two) times daily. 10 capsule 0  . Omega-3 Fatty Acids (FISH OIL) 1000 MG CAPS Take 1 capsule by mouth daily.     . polyethylene glycol (MIRALAX / GLYCOLAX) 17 g packet Take 17 g by mouth daily. 14 each 0  . thyroid (NP THYROID) 60 MG tablet Take 1 tablet (60 mg total) by mouth daily before breakfast. 30 tablet 3   Social History   Socioeconomic History  . Marital status: Married    Spouse name: Not on file  . Number of children: Not on file  . Years of education: college  . Highest education level: Not on file  Occupational History  . Occupation: call center office    Employer: [employer]  Tobacco Use  . Smoking status: Former Smoker    Packs/day: 0.25    Types: Cigarettes    Quit date: 04/26/2014    Years since quitting: 5.8  . Smokeless tobacco: Never Used  Vaping Use  . Vaping Use: Never used  Substance and Sexual Activity  . Alcohol use: No  . Drug use: No  . Sexual activity: Yes    Birth control/protection: None, Surgical    Comment: tubal   Other Topics Concern  . Not on file  Social History Narrative  . Not on file   Social Determinants of Health   Financial Resource Strain: Not on file  Food Insecurity: Not on file  Transportation Needs: Not on file  Physical Activity: Not on  file  Stress: Not on file  Social Connections: Not on file  Intimate Partner Violence: Not on file   Family History  Problem Relation Age of Onset  . Drug abuse Mother   . Cancer Mother        cervix  . Bipolar disorder Father   . Drug abuse Father   . Heart disease Other   . Arthritis Other   . Cancer Other   . Asthma Other   . Diabetes Other   . Depression Sister   . Anxiety disorder Sister   . Bipolar disorder Paternal Uncle     OBJECTIVE:  Vitals:   03/05/20 1545  BP: 106/75  Pulse: 86  Resp: 18  Temp: 98.5 F (36.9 C)  TempSrc: Oral  SpO2: 97%     General appearance: alert; appears fatigued, but nontoxic; speaking in full sentences and tolerating own secretions HEENT: NCAT; Ears: EACs clear, TMs pearly gray; Eyes: PERRL.  EOM grossly intact. Sinuses: nontender;  Nose: nares patent without rhinorrhea, Throat: oropharynx clear, tonsils non erythematous or enlarged, uvula midline  Neck: supple without LAD Lungs: unlabored respirations, symmetrical air entry; cough: mild; no respiratory distress; CTAB Heart: regular rate and rhythm.  Radial pulses 2+ symmetrical bilaterally Skin: warm and dry Psychological: alert and cooperative; normal mood and affect  LABS:  No results found for this or any previous visit (from the past 24 hour(s)).   ASSESSMENT & PLAN:  1. Sore throat   2. Viral sinusitis   3. Encounter for screening for COVID-19     Meds ordered this encounter  Medications  . cetirizine (ZYRTEC ALLERGY) 10 MG tablet    Sig: Take 1 tablet (10 mg total) by mouth daily.    Dispense:  30 tablet    Refill:  0  . fluticasone (FLONASE) 50 MCG/ACT nasal spray    Sig: Place 1 spray into both nostrils daily for 14 days.    Dispense:  16 g    Refill:  0  . predniSONE (DELTASONE) 10 MG tablet    Sig: Take 2 tablets (20 mg total) by mouth daily.    Dispense:  15 tablet    Refill:  0  . benzonatate (TESSALON) 100 MG capsule    Sig: Take 1 capsule (100 mg total) by mouth 3 (three) times daily as needed for cough.    Dispense:  30 capsule    Refill:  0   Discharge Instructions.Marland Kitchen    COVID testing ordered.  It will take between 2-7 days for test results.  Someone will contact you regarding abnormal results.    Get plenty of rest and push fluids Tessalon Perles prescribed for cough  Zyrtec for nasal congestion, runny nose, and/or sore throat Flonase and prednisone were prescribed for middle ear effusion Use medications daily for symptom relief Use OTC medications like ibuprofen or tylenol as needed fever or pain Call or go to the ED if you have any new or worsening symptoms such as fever, worsening cough, shortness of breath, chest tightness, chest pain, turning blue, changes in mental status, etc...   Reviewed expectations re: course of  current medical issues. Questions answered. Outlined signs and symptoms indicating need for more acute intervention. Patient verbalized understanding. After Visit Summary given.         Emerson Monte, [place] 03/05/20 [phone]

## 2020-03-06 LAB — NOVEL CORONAVIRUS, NAA: SARS-CoV-2, NAA: NOT DETECTED

## 2020-03-06 LAB — SARS-COV-2, NAA 2 DAY TAT

## 2020-05-05 ENCOUNTER — Encounter (INDEPENDENT_AMBULATORY_CARE_PROVIDER_SITE_OTHER): Payer: Medicaid Other | Admitting: Internal Medicine

## 2020-06-01 ENCOUNTER — Emergency Department (HOSPITAL_COMMUNITY)
Admission: EM | Admit: 2020-06-01 | Discharge: 2020-06-02 | Disposition: A | Discharge status: Home / Self Care | Payer: No Typology Code available for payment source | Attending: Emergency Medicine | Admitting: Emergency Medicine

## 2020-06-01 ENCOUNTER — Other Ambulatory Visit: Payer: Self-pay

## 2020-06-01 ENCOUNTER — Encounter (HOSPITAL_COMMUNITY): Payer: Self-pay | Admitting: *Deleted

## 2020-06-01 DIAGNOSIS — R221 Localized swelling, mass and lump, neck: Secondary | ICD-10-CM | POA: Diagnosis present

## 2020-06-01 DIAGNOSIS — G44209 Tension-type headache, unspecified, not intractable: Secondary | ICD-10-CM | POA: Insufficient documentation

## 2020-06-01 DIAGNOSIS — R7309 Other abnormal glucose: Secondary | ICD-10-CM

## 2020-06-01 DIAGNOSIS — E1165 Type 2 diabetes mellitus with hyperglycemia: Secondary | ICD-10-CM | POA: Diagnosis not present

## 2020-06-01 DIAGNOSIS — E039 Hypothyroidism, unspecified: Secondary | ICD-10-CM | POA: Diagnosis not present

## 2020-06-01 DIAGNOSIS — I889 Nonspecific lymphadenitis, unspecified: Secondary | ICD-10-CM

## 2020-06-01 DIAGNOSIS — Z87891 Personal history of nicotine dependence: Secondary | ICD-10-CM | POA: Diagnosis not present

## 2020-06-01 DIAGNOSIS — L040 Acute lymphadenitis of face, head and neck: Secondary | ICD-10-CM | POA: Insufficient documentation

## 2020-06-01 NOTE — ED Triage Notes (Signed)
Pt with cysts to right side of neck since this morning, states it's getting worse.  Denies any bad teeth to the area.  Able to swallow without difficultly.

## 2020-06-01 NOTE — ED Triage Notes (Signed)
C/o migraine HA today.

## 2020-06-02 DIAGNOSIS — L040 Acute lymphadenitis of face, head and neck: Secondary | ICD-10-CM | POA: Diagnosis not present

## 2020-06-02 LAB — CBC WITH DIFFERENTIAL/PLATELET
Abs Immature Granulocytes: 0.02 10*3/uL (ref 0.00–0.07)
Basophils Absolute: 0 10*3/uL (ref 0.0–0.1)
Basophils Relative: 1 %
Eosinophils Absolute: 0.2 10*3/uL (ref 0.0–0.5)
Eosinophils Relative: 2 %
HCT: 40.8 % (ref 36.0–46.0)
Hemoglobin: 13.6 g/dL (ref 12.0–15.0)
Immature Granulocytes: 0 %
Lymphocytes Relative: 32 %
Lymphs Abs: 2.4 10*3/uL (ref 0.7–4.0)
MCH: 31 pg (ref 26.0–34.0)
MCHC: 33.3 g/dL (ref 30.0–36.0)
MCV: 92.9 fL (ref 80.0–100.0)
Monocytes Absolute: 0.6 10*3/uL (ref 0.1–1.0)
Monocytes Relative: 7 %
Neutro Abs: 4.4 10*3/uL (ref 1.7–7.7)
Neutrophils Relative %: 58 %
Platelets: 194 10*3/uL (ref 150–400)
RBC: 4.39 MIL/uL (ref 3.87–5.11)
RDW: 13.3 % (ref 11.5–15.5)
WBC: 7.6 10*3/uL (ref 4.0–10.5)
nRBC: 0 % (ref 0.0–0.2)

## 2020-06-02 LAB — BASIC METABOLIC PANEL
Anion gap: 8 (ref 5–15)
BUN: 14 mg/dL (ref 6–20)
CO2: 27 mmol/L (ref 22–32)
Calcium: 8.9 mg/dL (ref 8.9–10.3)
Chloride: 102 mmol/L (ref 98–111)
Creatinine, Ser: 0.8 mg/dL (ref 0.44–1.00)
GFR, Estimated: >60 mL/min (ref >60)
Glucose, Bld: 245 mg/dL — ABNORMAL HIGH (ref 70–99)
Potassium: 3.7 mmol/L (ref 3.5–5.1)
Sodium: 137 mmol/L (ref 135–145)

## 2020-06-02 LAB — GROUP A STREP BY PCR: Group A Strep by PCR: NOT DETECTED

## 2020-06-02 MED ORDER — PROCHLORPERAZINE EDISYLATE 10 MG/2ML IJ SOLN
10.0000 mg | Freq: Once | INTRAMUSCULAR | Status: AC
  Administered 2020-06-02: 10 mg via INTRAVENOUS
  Filled 2020-06-02: qty 2

## 2020-06-02 MED ORDER — DOXYCYCLINE HYCLATE 100 MG PO TABS
100.0000 mg | ORAL_TABLET | Freq: Once | ORAL | Status: AC
  Administered 2020-06-02: 100 mg via ORAL
  Filled 2020-06-02: qty 1

## 2020-06-02 MED ORDER — DOXYCYCLINE HYCLATE 100 MG PO CAPS: 100.0000 mg | ORAL_CAPSULE | Freq: Two times a day (BID) | ORAL | 0 refills | Status: DC

## 2020-06-02 MED ORDER — DIPHENHYDRAMINE HCL 50 MG/ML IJ SOLN
25.0000 mg | Freq: Once | INTRAMUSCULAR | Status: AC
  Administered 2020-06-02: 25 mg via INTRAVENOUS
  Filled 2020-06-02: qty 1

## 2020-06-02 MED ORDER — METFORMIN HCL 500 MG PO TABS
500.0000 mg | ORAL_TABLET | Freq: Once | ORAL | Status: AC
  Administered 2020-06-02: 500 mg via ORAL
  Filled 2020-06-02: qty 1

## 2020-06-02 MED ORDER — LACTATED RINGERS IV BOLUS
1000.0000 mL | Freq: Once | INTRAVENOUS | Status: AC
  Administered 2020-06-02: 1000 mL via INTRAVENOUS

## 2020-06-02 MED ORDER — METFORMIN HCL 500 MG PO TABS: 500.0000 mg | ORAL_TABLET | Freq: Two times a day (BID) | ORAL | 0 refills | Status: DC

## 2020-06-02 NOTE — ED Provider Notes (Signed)
[facility] EMERGENCY DEPARTMENT Provider Note   CSN: [id number] Arrival date & time: 06/01/20  2149     History Chief Complaint  Patient presents with  . Cyst    Jackie Smith is a 35 y.o. female.  The history is provided by the patient.  She has history of diabetes, irritable bowel syndrome, bipolar disorder and comes in because of a painful swelling on the right side of her neck.  She noted the swelling this morning and it seems to have gotten worse.  Pain is severe and she rates it at 10/10.  She denies fever, chills, sweats.  She denies any sore throat or difficulty swallowing.  She denies any rhinorrhea or cough.  She also states that she woke up with an occipital headache typical of her migraines.  There is no associated photophobia or phonophobia and no nausea or vomiting.  She is continuing to have her headache.  She does have history of diabetes but is not currently on any medication.  Also, history of hypothyroidism but last blood work she had was normal and she has been off of her thyroid medication.  Past Medical History:  Diagnosis Date  . Acid reflux   . Bipolar 1 disorder ([place])   . Frequent UTI   . Gastritis   . History of benign breast tumor   . Infections of kidney   . Irritable bowel syndrome (IBS)   . Thyroid disease   . Type 2 diabetes mellitus ([place]) 05/02/2019  . Vaginal Pap smear, abnormal     Patient Active Problem List   Diagnosis Date Noted  . Hypothyroidism 05/01/2019  . Morbid obesity ([place]) 05/01/2019  . Mild intermittent asthma 05/01/2019  . Vitamin D deficiency 05/01/2019  . Major depression 05/15/2014    Past Surgical History:  Procedure Laterality Date  . c sections    . DILATION AND CURETTAGE OF UTERUS    . INDUCED ABORTION    . LASER ABLATION OF THE CERVIX    . TUBAL LIGATION       OB History    Gravida  5   Para  4   Term  3   Preterm  1   AB  1   Living  3     SAB  0   IAB  1   Ectopic      Multiple       Live Births              Family History  Problem Relation Age of Onset  . Drug abuse Mother   . Cancer Mother        cervix  . Bipolar disorder Father   . Drug abuse Father   . Heart disease Other   . Arthritis Other   . Cancer Other   . Asthma Other   . Diabetes Other   . Depression Sister   . Anxiety disorder Sister   . Bipolar disorder Paternal Uncle     Social History   Tobacco Use  . Smoking status: Former Smoker    Packs/day: 0.25    Types: Cigarettes    Quit date: 04/26/2014    Years since quitting: 6.1  . Smokeless tobacco: Never Used  Vaping Use  . Vaping Use: Never used  Substance Use Topics  . Alcohol use: No  . Drug use: No    Home Medications Prior to Admission medications   Medication Sig Start Date End Date Taking? Authorizing Provider  albuterol (VENTOLIN  HFA) 108 (90 Base) MCG/ACT inhaler Inhale 2 puffs into the lungs every 6 (six) hours as needed for wheezing or shortness of breath. 05/01/19   Ailene Ards, NP  benzonatate (TESSALON) 100 MG capsule Take 1 capsule (100 mg total) by mouth 3 (three) times daily as needed for cough. 03/05/20   Avegno, Darrelyn Hillock, FNP  cetirizine (ZYRTEC ALLERGY) 10 MG tablet Take 1 tablet (10 mg total) by mouth daily. 03/05/20   Avegno, Darrelyn Hillock, FNP  Cholecalciferol (VITAMIN D3) 5000 units CAPS Take 1 capsule by mouth daily.    [provider]  fluticasone (FLONASE) 50 MCG/ACT nasal spray Place 1 spray into both nostrils daily for 14 days. 03/05/20 03/19/20  Avegno, Darrelyn Hillock, FNP  nitrofurantoin, macrocrystal-monohydrate, (MACROBID) 100 MG capsule Take 1 capsule (100 mg total) by mouth 2 (two) times daily. 11/14/19   Avegno, Darrelyn Hillock, FNP  Omega-3 Fatty Acids (FISH OIL) 1000 MG CAPS Take 1 capsule by mouth daily.    [provider]  polyethylene glycol (MIRALAX / GLYCOLAX) 17 g packet Take 17 g by mouth daily. 04/11/19   Wieters, Hallie C, PA-C  predniSONE (DELTASONE) 10 MG tablet Take 2 tablets  (20 mg total) by mouth daily. 03/05/20   Avegno, Darrelyn Hillock, FNP  thyroid (NP THYROID) 60 MG tablet Take 1 tablet (60 mg total) by mouth daily before breakfast. 10/03/18   Doree Albee, MD    Allergies    Imitrex [sumatriptan base]  Review of Systems   Review of Systems  All other systems reviewed and are negative.   Physical Exam Updated Vital Signs BP 113/61   Pulse 89   Temp 98.3 F (36.8 C) (Oral)   Resp 16   Ht 5\' 4"  (1.626 m)   Wt 118.4 kg   SpO2 100%   BMI 44.80 kg/m   Physical Exam Vitals and nursing note reviewed.   35 year old female, resting comfortably and in no acute distress. Vital signs are normal. Oxygen saturation is 100%, which is normal. Head is normocephalic and atraumatic. PERRLA, EOMI. Oropharynx is clear. Neck: There is a tender right anterior cervical lymph node at the level of the angle of the mandible.  There is no fluctuance but it is very tender.  There is also tenderness palpation at the insertion of the paracervical muscles on the right. Back is nontender and there is no CVA tenderness. Lungs are clear without rales, wheezes, or rhonchi. Chest is nontender. Heart has regular rate and rhythm without murmur. Abdomen is soft, flat, nontender without masses or hepatosplenomegaly and peristalsis is normoactive. Extremities have no cyanosis or edema, full range of motion is present. Skin is warm and dry without rash. Neurologic: Mental status is normal, cranial nerves are intact, there are no motor or sensory deficits.  ED Results / Procedures / Treatments   Labs (all labs ordered are listed, but only abnormal results are displayed) Labs Reviewed  BASIC METABOLIC PANEL - Abnormal; Notable for the following components:      Result Value   Glucose, Bld 245 (*)    All other components within normal limits  GROUP A STREP BY PCR  CBC WITH DIFFERENTIAL/PLATELET   Procedures Procedures   Medications Ordered in ED Medications  lactated  ringers bolus 1,000 mL (has no administration in time range)  prochlorperazine (COMPAZINE) injection 10 mg (has no administration in time range)  diphenhydrAMINE (BENADRYL) injection 25 mg (has no administration in time range)    ED Course  I have reviewed the triage vital signs and the nursing notes.  Pertinent labs & imaging results that were available during my care of the patient were reviewed by me and considered in my medical decision making (see chart for details).   MDM Rules/Calculators/A&P                         Tender right anterior cervical lymph node.  Headache which actually suspect is a muscle contraction headache rather than the migraine.  Will check strep PCR, screening labs.  Headache cocktail was given including lactated Ringer's solution, prochlorperazine, diphenhydramine.  Old records are reviewed, and she does have a recent ED visit for sore throat.   sHeadache is much improved with above-noted treatment. Strep PCR is negative.  CBC is normal.  Basic metabolic panel is significant for glucose of 245.  She will need to be restarted on oral hypoglycemic medication.  She is given a dose of metformin.  Because of elevated glucose, I wish to avoid steroids.  She is given a dose of doxycycline.  She is discharged with prescriptions for doxycycline and metformin.  Patient states that she is arranging for a new primary care provider at the [facility] clinic.  Patient advised that if her current prescription for metformin runs out before she can see her new primary care provider, she will need to have it refilled.  Final Clinical Impression(s) / ED Diagnoses Final diagnoses:  Cervical adenitis  Muscle contraction headache  Elevated random blood glucose level    Rx / DC Orders ED Discharge Orders         Ordered    metFORMIN (GLUCOPHAGE) 500 MG tablet  2 times daily with meals        06/02/20 0248    doxycycline (VIBRAMYCIN) 100 MG capsule  2 times daily        06/02/20 1610            Delora Fuel, MD 96/04/54 [phone]

## 2020-06-02 NOTE — Discharge Instructions (Addendum)
Apply warm compresses to the swollen area.  You may take acetaminophen and/or ibuprofen as needed for pain.  Return if symptoms seem to be getting worse.  Here blood sugar was elevated today.  You have been given a prescription for medication to keep your blood sugar down.  If the prescription runs out before you can be seen by your new primary care provider, please make sure that the prescription gets refilled either by going to an urgent care center or by coming back to the emergency department.

## 2020-06-03 ENCOUNTER — Telehealth: Payer: Self-pay | Admitting: *Deleted

## 2020-06-03 NOTE — Telephone Encounter (Signed)
Transition Care Management Follow-up Telephone Call  Date of discharge and from where: 06/02/2020 - [facility]  How have you been since you were released from the hospital? "I am doing fine"  Any questions or concerns? No  Items Reviewed:  Did the pt receive and understand the discharge instructions provided? Yes   Medications obtained and verified? Yes   Other? No   Any new allergies since your discharge? No   Dietary orders reviewed? No  Do you have support at home? Yes    Functional Questionnaire: (I = Independent and D = Dependent) ADLs: I  Bathing/Dressing- I  Meal Prep- I  Eating- I  Maintaining continence- I  Transferring/Ambulation- I  Managing Meds- I  Follow up appointments reviewed:   PCP Hospital f/u appt confirmed? No    Specialist Hospital f/u appt confirmed? No    Are transportation arrangements needed? N/A  If their condition worsens, is the pt aware to call PCP or go to the Emergency Dept.? Yes  Was the patient provided with contact information for the PCP's office or ED? Yes  Was to pt encouraged to call back with questions or concerns? Yes

## 2020-06-30 DIAGNOSIS — F319 Bipolar disorder, unspecified: Secondary | ICD-10-CM | POA: Diagnosis not present

## 2020-06-30 DIAGNOSIS — R0683 Snoring: Secondary | ICD-10-CM | POA: Diagnosis not present

## 2020-06-30 DIAGNOSIS — E039 Hypothyroidism, unspecified: Secondary | ICD-10-CM | POA: Diagnosis not present

## 2020-06-30 DIAGNOSIS — E119 Type 2 diabetes mellitus without complications: Secondary | ICD-10-CM | POA: Diagnosis not present

## 2020-06-30 DIAGNOSIS — K219 Gastro-esophageal reflux disease without esophagitis: Secondary | ICD-10-CM | POA: Diagnosis not present

## 2020-07-03 ENCOUNTER — Telehealth: Payer: Self-pay

## 2020-07-03 NOTE — Telephone Encounter (Signed)
..  Patient declines further follow up and engagement by the Managed Medicaid Team. Appropriate care team members and provider have been notified via electronic communication. The Managed Medicaid Team is available to follow up with the patient after provider conversation with the patient regarding recommendation for engagement and subsequent re-referral to the Managed Medicaid Team.    Jennifer Alley Care Guide, High Risk Medicaid Managed Care Embedded Care Coordination [place]  [facility]   

## 2020-09-29 ENCOUNTER — Ambulatory Visit (INDEPENDENT_AMBULATORY_CARE_PROVIDER_SITE_OTHER): Payer: No Typology Code available for payment source | Admitting: Neurology

## 2020-09-29 ENCOUNTER — Encounter: Payer: Self-pay | Admitting: Neurology

## 2020-09-29 VITALS — BP 122/84 | HR 85 | Ht 64.0 in | Wt 258.5 lb

## 2020-09-29 DIAGNOSIS — G4730 Sleep apnea, unspecified: Secondary | ICD-10-CM | POA: Diagnosis not present

## 2020-09-29 DIAGNOSIS — Z6841 Body Mass Index (BMI) 40.0 and over, adult: Secondary | ICD-10-CM

## 2020-09-29 DIAGNOSIS — R0681 Apnea, not elsewhere classified: Secondary | ICD-10-CM

## 2020-09-29 DIAGNOSIS — E039 Hypothyroidism, unspecified: Secondary | ICD-10-CM | POA: Diagnosis not present

## 2020-09-29 DIAGNOSIS — K219 Gastro-esophageal reflux disease without esophagitis: Secondary | ICD-10-CM | POA: Diagnosis not present

## 2020-09-29 DIAGNOSIS — E119 Type 2 diabetes mellitus without complications: Secondary | ICD-10-CM | POA: Insufficient documentation

## 2020-09-29 DIAGNOSIS — G4710 Hypersomnia, unspecified: Secondary | ICD-10-CM | POA: Insufficient documentation

## 2020-09-29 DIAGNOSIS — R0683 Snoring: Secondary | ICD-10-CM | POA: Insufficient documentation

## 2020-09-29 DIAGNOSIS — J4520 Mild intermittent asthma, uncomplicated: Secondary | ICD-10-CM | POA: Diagnosis not present

## 2020-09-29 NOTE — Patient Instructions (Signed)
Obesity-Hypoventilation Syndrome Obesity-hypoventilation syndrome (OHS) is a condition in which a person cannot efficiently move air in and out of the lungs (ventilate). This condition causes hypoventilation, which means the blood will have a buildup of carbon dioxideand a drop in oxygen levels. Key factors for OHS include having too much body fat, or obesity, and having high levels of awake daytime carbon dioxide (hypercapnia). OHS can increase the risk for: Cor pulmonale, or right-sided heart failure. Congestive heart failure. Pulmonary hypertension, or high blood pressure in the arteries in the lungs. Too many red blood cells in the body. Disability or death. What are the causes? This condition may be caused by: Being obese with a BMI (body mass index) greater than or equal to 30 kg/m2. Having too much fat around the abdomen, chest, and neck. The brain being unable to properly manage the high carbon dioxide and low oxygen levels. Hormones made by fat cells. These hormones may interfere with breathing. Sleep apnea. This is when breathing stops, pauses, or is shallow during sleep. What are the signs or symptoms? Symptoms of this condition include: Feeling sleepy during the day. Headaches. These may be worse in the morning. Shortness of breath. Snoring, choking, gasping, or trouble breathing during sleep. Poor concentration or poor memory. Mood changes or feeling irritable. Depression. How is this diagnosed? This condition may be diagnosed by: BMI measurement. Blood tests to measure blood levels of serum bicarbonate, carbon dioxide, and oxygen. Pulse oximetry to measure the amount of oxygen in your blood. This uses a small device that is placed on your finger, earlobe, or toe. Polysomnogram, or sleep study, to check your breathing patterns and levels of oxygen and carbon dioxide while you sleep. You may also have other tests, including: A chest X-ray to rule out other breathing  problems. Lung tests, or pulmonary function tests, to rule out other breathing problems. ECG (electrocardiogram) or echocardiogram to check for signs of heart failure. How is this treated? This condition may be treated with: A device such as a continuous positive airway pressure (CPAP) machine or a bi-level positive airway pressure (BPAP) machine. These devices deliver pressure and sometimes oxygen to make breathing easier. A mask may be placed over your nose or mouth. Oxygen if your blood oxygen levels are low. A weight-loss program. Bariatric, or weight-loss, surgery. Tracheostomy. A tube is placed in the windpipe through the neck to help with breathing. Follow these instructions at home: Medicines Take over-the-counter and prescription medicines only as told by your health care provider. Ask your health care provider what medicines are safe for you. You may be told to avoid medicines such as sedatives and narcotics. These can affect breathing and make OHS worse. Sleeping habits If you are prescribed a CPAP or a BPAP machine, make sure you understand how to use it. Use your CPAP or BPAP machine only as told by your health care provider. Try to get at least 8 hours of sleep every night. Eating and drinking  Eat foods that are high in fiber, such as beans, whole grains, and fresh fruits and vegetables. Limit foods that are high in fat and processed sugars, such as fried or sweet foods. Drink enough fluid to keep your urine pale yellow. Do not drink alcohol if: Your health care provider tells you not to drink. You are pregnant, may be pregnant, or are planning to become pregnant. General instructions Follow a diet and exercise plan that helps you reach and keep a healthy weight as told by your  health care provider. Exercise regularly as told by your health care provider. Do not use any products that contain nicotine or tobacco, such as cigarettes, e-cigarettes, and chewing tobacco. If you  need help quitting, ask your health care provider. Keep all follow-up visits as told by your health care provider. This is important. Contact a health care provider if: You develop new or worsening shortness of breath. You are having trouble waking up or staying awake. You are confused. You have chest pain. You have fast or irregular heartbeats. You are dizzy or you faint. You develop a cough. You have a fever. Get help right away if: You have any symptoms of a stroke. "BE FAST" is an easy way to remember the main warning signs of a stroke: B - Balance. Signs are dizziness, sudden trouble walking, or loss of balance. E - Eyes. Signs are trouble seeing or a sudden change in vision. F - Face. Signs are sudden weakness or numbness of the face, or the face or eyelid drooping on one side. A - Arms. Signs are weakness or numbness in an arm. This happens suddenly and usually on one side of the body. S - Speech. Signs are sudden trouble speaking, slurred speech, or trouble understanding what people say. T - Time. Time to call emergency services. Write down what time symptoms started. You have other signs of a stroke, such as: A sudden, severe headache with no known cause. Nausea or vomiting. Seizure. These symptoms may represent a serious problem that is an emergency. Do not wait to see if the symptoms will go away. Get medical help right away. Call your local emergency services (911 in the [place] Do not drive yourself to the hospital. If you ever feel like you may hurt yourself or others, or have thoughts about taking your own life, get help right away. Go to your nearest emergency department or: Call your local emergency services (911 in the [place] Call a suicide crisis helpline, such as the [facility] at 442-479-4525. This is open 24 hours a day in the [place] Text the Crisis Text Line at 607 186 6882 (in the [place].). Summary Obesity-hypoventilation syndrome (OHS) causes  hypoventilation, which means the blood will have a buildup of carbon dioxideand a drop in oxygen levels. Key factors for OHS include having too much body fat, or obesity, and having high levels of awake daytime carbon dioxide (hypercapnia). OHS can increase the risk for heart failure, pulmonary hypertension, disability, and death. Follow your diet and exercise plan as told by your health care provider. This information is not intended to replace advice given to you by your health care provider. Make sure you discuss any questions you have with your health care provider. Document Revised: 12/28/2018 Document Reviewed: 12/28/2018 Elsevier Patient Education  2022 [place] Sleep Information, Adult Quality sleep is important for your mental and physical health. It also improves your quality of life. Quality sleep means you: Are asleep for most of the time you are in bed. Fall asleep within 30 minutes. Wake up no more than once a night.  Are awake for no longer than 20 minutes if you do wake up during the night. Most adults need 7-8 hours of quality sleep each night. How can poor sleep affect me? If you do not get enough quality sleep, you may have: Mood swings. Daytime sleepiness. Confusion. Decreased reaction time. Sleep disorders, such as insomnia and sleep apnea. Difficulty with: Solving problems. Coping with stress. Paying attention. These issues  may affect your performance and productivity at work, school, and at home. Lack of sleep may also put you at higher risk for accidents, suicide, and risky behaviors. If you do not get quality sleep you may also be at higher risk for several health problems, including: Infections. Type 2 diabetes. Heart disease. High blood pressure. Obesity. Worsening of long-term conditions, like arthritis, kidney disease, depression, Parkinson's disease, and epilepsy. What actions can I take to get more quality sleep?   Stick to a sleep  schedule. Go to sleep and wake up at about the same time each day. Do not try to sleep less on weekdays and make up for lost sleep on weekends. This does not work. Try to get about 30 minutes of exercise on most days. Do not exercise 2-3 hours before going to bed. Limit naps during the day to 30 minutes or less. Do not use any products that contain nicotine or tobacco, such as cigarettes or e-cigarettes. If you need help quitting, ask your health care provider. Do not drink caffeinated beverages for at least 8 hours before going to bed. Coffee, tea, and some sodas contain caffeine. Do not drink alcohol close to bedtime. Do not eat large meals close to bedtime. Do not take naps in the late afternoon. Try to get at least 30 minutes of sunlight every day. Morning sunlight is best. Make time to relax before bed. Reading, listening to music, or taking a hot bath promotes quality sleep. Make your bedroom a place that promotes quality sleep. Keep your bedroom dark, quiet, and at a comfortable room temperature. Make sure your bed is comfortable. Take out sleep distractions like TV, a computer, smartphone, and bright lights. If you are lying awake in bed for longer than 20 minutes, get up and do a relaxing activity until you feel sleepy. Work with your health care provider to treat medical conditions that may affect sleeping, such as: Nasal obstruction. Snoring. Sleep apnea and other sleep disorders. Talk to your health care provider if you think any of your prescription medicines may cause you to have difficulty falling or staying asleep. If you have sleep problems, talk with a sleep consultant. If you think you have a sleep disorder, talk with your health care provider about getting evaluated by a specialist. Where to find more information [place] website: https://sleepfoundation.org National Heart, Lung, and [place] ([place]):  http://www.saunders.info/.pdf Centers for Disease Control and Prevention (CDC): LearningDermatology.pl Contact a health care provider if you: Have trouble getting to sleep or staying asleep. Often wake up very early in the morning and cannot get back to sleep. Have daytime sleepiness. Have daytime sleep attacks of suddenly falling asleep and sudden muscle weakness (narcolepsy). Have a tingling sensation in your legs with a strong urge to move your legs (restless legs syndrome). Stop breathing briefly during sleep (sleep apnea). Think you have a sleep disorder or are taking a medicine that is affecting your quality of sleep. Summary Most adults need 7-8 hours of quality sleep each night. Getting enough quality sleep is an important part of health and well-being. Make your bedroom a place that promotes quality sleep and avoid things that may cause you to have poor sleep, such as alcohol, caffeine, smoking, and large meals. Talk to your health care provider if you have trouble falling asleep or staying asleep. This information is not intended to replace advice given to you by your health care provider. Make sure you discuss any questions you have with your  health care provider. Document Revised: 04/12/2017 Document Reviewed: 04/12/2017 Elsevier Patient Education  [place].

## 2020-09-29 NOTE — Progress Notes (Signed)
SLEEP MEDICINE CLINIC    Provider:  Larey Seat, MD  Primary Care Physician:  Alanson Puls, [facility] [place] [address]     Referring Provider: Denyce Robert, [place] [address] [place],  [place] [address]          Chief Complaint according to patient   Patient presents with:     New Patient (Initial Visit)           HISTORY OF PRESENT ILLNESS:  Jackie Smith is a 35 y.o. year old biracial female patient was seen here upon referral on 09/29/2020  Chief concern according to patient :  " I am [place], and native [place]'  Jackie Smith describes that her husband has been reporting her to snore and to have apneas at night but she witnessed.  It is a louder snoring and more frequent apnea noted when she is in supine position sleeping on her back.  She also mentions that she has been a former smoker and has had some weight gain over the last 3 to 4 years.  Both of her parents carry a diagnosis of sleep apnea."     I have the pleasure of seeing Jackie Smith today, a right-handed  female with a possible sleep disorder.  She  has a past medical history of Acid reflux, Bipolar 1 disorder ([place]), Panic attacks, hyperglycemia, Frequent UTI, Gastritis, History of benign breast tumor, Infections of kidney, Irritable bowel syndrome (IBS), Thyroid disease, Type 2 diabetes mellitus ([place]) (05/02/2019), and Vaginal Pap smear, abnormal.   Sleep relevant medical history: Nocturia 4-5, chronic Insomnia, Anxiety, Panic disorder. Obesity, increasing - unable to loose- no Tonsillectomy.  Family medical /sleep history: Parents ( mother is deceased )  on CPAP with OSA, insomnia,  son is a sleep walker.    Social history:  Patient is working as and lives in a household with spouse and 2 younger children, her son is married with children at age 66. 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 10 hour day time work. 7 Am to 5.30 PM.   Pets are present. 1 dog.  Tobacco use quit 6.5 .years ago-   ETOH use : 1/ 3 months,  Caffeine intake in form of Coffee( 2 iced coffee at [employer], ) Soda( pepsi, 20 ounces 2/ week ) Tea ( homebrewed, sweet- at dinner) or energy drinks. Regular exercise in form of - none , no energy left.    Sleep habits are as follows: The patient's dinner time is between 6-7 PM. The patient goes to bed at  11 PM and struggles to go to sleep- used Ambien, Seroquel , now off all meds. -once asleep continues to sleep for intervals 1-2 ours  hours, wakes for several  bathroom breaks, the first time at 1-2 AM.   The preferred sleep position is sideways or prone- but wakes up in supine, with the support of 3 pillows.  Dreams are reportedly frequent/vivid. More frequent since taking melatonin.  6.30  AM is the usual rise time. The patient wakes up with an alarm. On Saturdays she sleeps in until noon.  She reports not feeling refreshed or restored in AM, with symptoms such as dry mouth, and residual fatigue.  Naps are taken frequently, lasting from 1-2 hours and are more refreshing than nocturnal sleep.    Review of Systems: Out of a complete 14 system review, the patient complains of only the following  symptoms, and all other reviewed systems are negative.:  Fatigue, sleepiness , snoring,  fragmented sleep, NOCTURIA 4-5 times- Insomnia - initiation and sleep sustaining.    How likely are you to doze in the following situations: 0 = not likely, 1 = slight chance, 2 = moderate chance, 3 = high chance   Sitting and Reading? Watching Television? Sitting inactive in a public place (theater or meeting)? As a passenger in a car for an hour without a break? Lying down in the afternoon when circumstances permit? Sitting and talking to someone? Sitting quietly after lunch without alcohol? In a car, while stopped for a few minutes in traffic?   Total = 12/ 24 points   FSS endorsed at 56/ 63 points.   Social  History   Socioeconomic History   Marital status: Married    Spouse name: Not on file   Number of children: Not on file   Years of education: college   Highest education level: Not on file  Occupational History   Occupation: call center office    Employer: [employer]  Tobacco Use   Smoking status: Former    Packs/day: 0.25    Types: Cigarettes    Quit date: 04/26/2014    Years since quitting: 6.4   Smokeless tobacco: Never  Vaping Use   Vaping Use: Never used  Substance and Sexual Activity   Alcohol use: No   Drug use: No   Sexual activity: Yes    Birth control/protection: None, Surgical    Comment: tubal   Other Topics Concern   Not on file  Social History Narrative   Not on file   Social Determinants of Health   Financial Resource Strain: Not on file  Food Insecurity: Not on file  Transportation Needs: Not on file  Physical Activity: Not on file  Stress: Not on file  Social Connections: Not on file    Family History  Problem Relation Age of Onset   Drug abuse Mother    Cancer Mother        cervix   Bipolar disorder Father    Drug abuse Father    Heart disease Other    Arthritis Other    Cancer Other    Asthma Other    Diabetes Other    Depression Sister    Anxiety disorder Sister    Bipolar disorder Paternal Uncle     Past Medical History:  Diagnosis Date   Acid reflux    Bipolar 1 disorder ([place])    Frequent UTI    Gastritis    History of benign breast tumor    Infections of kidney    Irritable bowel syndrome (IBS)    Thyroid disease    Type 2 diabetes mellitus ([place]) 05/02/2019   Vaginal Pap smear, abnormal     Past Surgical History:  Procedure Laterality Date   c sections     DILATION AND CURETTAGE OF UTERUS     INDUCED ABORTION     LASER ABLATION OF THE CERVIX     TUBAL LIGATION       Current Outpatient Medications on File Prior to Visit  Medication Sig Dispense Refill   Cholecalciferol (VITAMIN D3) 5000 units CAPS Take 1  capsule by mouth daily.     metFORMIN (GLUCOPHAGE) 500 MG tablet Take 1 tablet (500 mg total) by mouth 2 (two) times daily with a meal. 60 tablet 0   omeprazole (PRILOSEC) 20 MG capsule Take 20 mg by mouth daily.  albuterol (VENTOLIN HFA) 108 (90 Base) MCG/ACT inhaler Inhale 2 puffs into the lungs every 6 (six) hours as needed for wheezing or shortness of breath. (Patient not taking: Reported on 09/29/2020) 8 g 2   cetirizine (ZYRTEC ALLERGY) 10 MG tablet Take 1 tablet (10 mg total) by mouth daily. (Patient not taking: Reported on 09/29/2020) 30 tablet 0   doxycycline (VIBRAMYCIN) 100 MG capsule Take 1 capsule (100 mg total) by mouth 2 (two) times daily. (Patient not taking: Reported on 09/29/2020) 20 capsule 0   fluticasone (FLONASE) 50 MCG/ACT nasal spray Place 1 spray into both nostrils daily for 14 days. 16 g 0   Omega-3 Fatty Acids (FISH OIL) 1000 MG CAPS Take 1 capsule by mouth daily. (Patient not taking: Reported on 09/29/2020)     polyethylene glycol (MIRALAX / GLYCOLAX) 17 g packet Take 17 g by mouth daily. (Patient not taking: Reported on 09/29/2020) 14 each 0   thyroid (NP THYROID) 60 MG tablet Take 1 tablet (60 mg total) by mouth daily before breakfast. (Patient not taking: Reported on 09/29/2020) 30 tablet 3   No current facility-administered medications on file prior to visit.    Allergies  Allergen Reactions   Imitrex [Sumatriptan Base] Nausea And Vomiting    Physical exam:  Today's Vitals   09/29/20 1039  BP: 122/84  Pulse: 85  Weight: 258 lb 8 oz (117.3 kg)  Height: '5\' 4"'$  (1.626 m)   Body mass index is 44.37 kg/m.   Wt Readings from Last 3 Encounters:  09/29/20 258 lb 8 oz (117.3 kg)  06/01/20 261 lb (118.4 kg)  08/23/19 248 lb (112.5 kg)     Ht Readings from Last 3 Encounters:  09/29/20 '5\' 4"'$  (1.626 m)  06/01/20 '5\' 4"'$  (1.626 m)  08/23/19 '5\' 4"'$  (1.626 m)      General: The patient is awake, alert and appears not in acute distress. The patient is well  groomed. Head: Normocephalic, atraumatic. Neck is supple. Mallampati 2 - tonsils large . ,  neck circumference:18.25 inches . Nasal airflow patent.  Retrognathia is  seen.  Dental status:  Cardiovascular:  Regular rate and cardiac rhythm by pulse,  without distended neck veins. Respiratory: Lungs are clear to auscultation.  Skin:  Without evidence of ankle edema, or rash. Trunk: The patient's posture is erect.   Neurologic exam : The patient is awake and alert, oriented to place and time.   Memory subjective described as intact.  Attention span & concentration ability appears normal.  Speech is fluent,  without  dysarthria, dysphonia or aphasia.  Mood and affect are appropriate.   Cranial nerves: no loss of smell or taste reported - unvaccinated, no history of covid infection.  Pupils are equal and briskly reactive to light. Funduscopic exam deferred. .  Extraocular movements in vertical and horizontal planes were intact and without nystagmus. No Diplopia. Visual fields by finger perimetry are intact. Hearing was intact to soft voice and finger rubbing.    Facial sensation intact to fine touch.  Facial motor strength is symmetric and tongue and uvula move midline.  Neck ROM : rotation, tilt and flexion extension were normal for age and shoulder shrug was symmetrical.    Motor exam:  Symmetric bulk, tone and ROM.   Normal tone without cog wheeling, symmetric grip strength .   Sensory:  Fine touch, pinprick and vibration were tested  and  normal.  Proprioception tested in the upper extremities was normal.   Coordination: Rapid alternating movements in  the fingers/hands were of normal speed.  The Finger-to-nose maneuver was intact without evidence of ataxia, dysmetria or tremor.   Gait and station: Patient could rise unassisted from a seated position, walked without assistive device.  Stance is of normal width/ base and the patient turned with 3 steps.  Toe and heel walk were  deferred.  Deep tendon reflexes: in the  upper and lower extremities are symmetric and intact.  Babinski response was deferred.       After spending a total time of  45  minutes face to face and additional time for physical and neurologic examination, review of laboratory studies,   personal review of imaging studies, reports and results of other testing and review of referral information / records as far as provided in visit, I have established the following assessments:  1) morbid obesity with high degree of fatigue and sleepiness.  Loud snoring, witnessed apnea.   2) unclear if hypothyroidism is still present.   3) GERD may also play a role in  apnea like events.    My Plan is to proceed with:  1) HST to screen for sleep apnea- what degree of apnea , hypoxia with it or not?   2) referral to weight and wellness. Patient interested in [facility]>    3) metabolic syndrome - had no labs in almost one year- check CMET, CBC, TSH and T4.  4) acid reflux at night. Sleeping elevated .   I would like to thank [facility], [facility] and [place], [place], [place] [address] [place],  [place] [address] for allowing me to meet with and to take care of this pleasant patient.   In short, Jackie Smith is presenting with excessive fatigue and sleepiness , a symptom that can be attributed to OSA, and Diabetes.    follow up through our NP within 2-4 month.   CC: I will share my notes with PCP .  Electronically signed by: Larey Seat, MD 09/29/2020 11:08 AM  [facility] Board certified by The [employer] of Sleep Medicine and Diplomate of the [employer] of Sleep Medicine. Board certified In Neurology through the [place], Fellow of the [employer] of Neurology. Medical Director of [facility].

## 2020-09-30 ENCOUNTER — Telehealth: Payer: Self-pay | Admitting: *Deleted

## 2020-09-30 LAB — CBC WITH DIFFERENTIAL/PLATELET
Basophils Absolute: 0.1 10*3/uL (ref 0.0–0.2)
Basos: 1 %
EOS (ABSOLUTE): 0.2 10*3/uL (ref 0.0–0.4)
Eos: 3 %
Hematocrit: 40.7 % (ref 34.0–46.6)
Hemoglobin: 13.8 g/dL (ref 11.1–15.9)
Immature Grans (Abs): 0 10*3/uL (ref 0.0–0.1)
Immature Granulocytes: 0 %
Lymphocytes Absolute: 1.7 10*3/uL (ref 0.7–3.1)
Lymphs: 29 %
MCH: 30.7 pg (ref 26.6–33.0)
MCHC: 33.9 g/dL (ref 31.5–35.7)
MCV: 90 fL (ref 79–97)
Monocytes Absolute: 0.6 10*3/uL (ref 0.1–0.9)
Monocytes: 9 %
Neutrophils Absolute: 3.5 10*3/uL (ref 1.4–7.0)
Neutrophils: 58 %
Platelets: 215 10*3/uL (ref 150–450)
RBC: 4.5 x10E6/uL (ref 3.77–5.28)
RDW: 13.6 % (ref 11.7–15.4)
WBC: 6 10*3/uL (ref 3.4–10.8)

## 2020-09-30 LAB — THYROID PANEL WITH TSH
Free Thyroxine Index: 1.8 (ref 1.2–4.9)
T3 Uptake Ratio: 24 % (ref 24–39)
T4, Total: 7.7 ug/dL (ref 4.5–12.0)
TSH: 3.22 u[IU]/mL (ref 0.450–4.500)

## 2020-09-30 LAB — COMPREHENSIVE METABOLIC PANEL
ALT: 104 IU/L — ABNORMAL HIGH (ref 0–32)
AST: 84 IU/L — ABNORMAL HIGH (ref 0–40)
Albumin/Globulin Ratio: 1.7 (ref 1.2–2.2)
Albumin: 4.4 g/dL (ref 3.8–4.8)
Alkaline Phosphatase: 90 IU/L (ref 44–121)
BUN/Creatinine Ratio: 14 (ref 9–23)
BUN: 11 mg/dL (ref 6–20)
Bilirubin Total: 0.3 mg/dL (ref 0.0–1.2)
CO2: 23 mmol/L (ref 20–29)
Calcium: 10 mg/dL (ref 8.7–10.2)
Chloride: 99 mmol/L (ref 96–106)
Creatinine, Ser: 0.8 mg/dL (ref 0.57–1.00)
Globulin, Total: 2.6 g/dL (ref 1.5–4.5)
Glucose: 111 mg/dL — ABNORMAL HIGH (ref 65–99)
Potassium: 4.6 mmol/L (ref 3.5–5.2)
Sodium: 137 mmol/L (ref 134–144)
Total Protein: 7 g/dL (ref 6.0–8.5)
eGFR: 97 mL/min/{1.73_m2} (ref >59)

## 2020-09-30 NOTE — Telephone Encounter (Signed)
Spoke with patient and discussed lab results showed a normal thyroid function but her AST and ALT are both elevated.  She needs to speak with her primary care asap.  Patient stated she will give them a call.  I also faxed the results over to the primary care clinic.

## 2020-09-30 NOTE — Telephone Encounter (Signed)
-----   Message from Larey Seat, MD sent at 09/30/2020 10:39 AM EDT ----- Normal thyroid function by TSH , T4 and uptake   Elevated transaminase enzymes ( LFT) this is a significant abnormality- please inform primary care !

## 2020-09-30 NOTE — Progress Notes (Signed)
Normal thyroid function by TSH , T4 and uptake   Elevated transaminase enzymes ( LFT) this is a significant abnormality- please inform primary care !

## 2020-10-01 ENCOUNTER — Institutional Professional Consult (permissible substitution): Payer: No Typology Code available for payment source | Admitting: Neurology

## 2020-11-04 ENCOUNTER — Ambulatory Visit (INDEPENDENT_AMBULATORY_CARE_PROVIDER_SITE_OTHER): Payer: No Typology Code available for payment source | Admitting: Neurology

## 2020-11-04 DIAGNOSIS — E039 Hypothyroidism, unspecified: Secondary | ICD-10-CM

## 2020-11-04 DIAGNOSIS — E119 Type 2 diabetes mellitus without complications: Secondary | ICD-10-CM

## 2020-11-04 DIAGNOSIS — Z6841 Body Mass Index (BMI) 40.0 and over, adult: Secondary | ICD-10-CM

## 2020-11-04 DIAGNOSIS — K219 Gastro-esophageal reflux disease without esophagitis: Secondary | ICD-10-CM

## 2020-11-04 DIAGNOSIS — G4710 Hypersomnia, unspecified: Secondary | ICD-10-CM

## 2020-11-04 DIAGNOSIS — J4520 Mild intermittent asthma, uncomplicated: Secondary | ICD-10-CM

## 2020-11-04 DIAGNOSIS — G4730 Sleep apnea, unspecified: Secondary | ICD-10-CM

## 2020-11-04 DIAGNOSIS — R0681 Apnea, not elsewhere classified: Secondary | ICD-10-CM

## 2020-11-04 DIAGNOSIS — G4733 Obstructive sleep apnea (adult) (pediatric): Secondary | ICD-10-CM

## 2020-11-04 DIAGNOSIS — R0683 Snoring: Secondary | ICD-10-CM

## 2020-11-09 NOTE — Progress Notes (Signed)
[facility] TEST REPORT ( by Watch PAT)   STUDY DATA:  11-09-2020 DOB:  October 05, 1985    ORDERING CLINICIAN: Larey Seat, MD  REFERRING CLINICIAN:    CLINICAL INFORMATION/HISTORY: Jackie Smith is a 35 - year -old biracial female patient and was seen upon referral on 09/29/2020. She reported her husband had witnessed apneas and loud snoring, mostly when on her back. Medical history of Acid reflux, Bipolar 1 disorder ([place]), hyperglycemia, Frequent UTI, Gastritis, History of benign breast tumor, Infections of kidney, Irritable bowel syndrome (IBS), Thyroid disease, Type 2 diabetes mellitus ([place]) (05/02/2019), Nocturia 4-5, chronic Insomnia, Anxiety, Panic disorder.   Epworth sleepiness score: 12/24.   BMI: 44 kg/m   Neck Circumference:    FINDINGS:   Sleep Summary:   Total Recording Time (hours, min): The total recording time for this home sleep test amounted to 10 hours 11 minutes of which 9 hours were total sleep time with a REM sleep time of 12.5%.                                  Respiratory Indices:   Calculated pAHI (per hour):      The calculated AHI per hour was 39.9 and was higher during non-REM sleep than in rem sleep.  REM AHI was 30.7/h and non-REM sleep AHI was 41.2/h.  His download stated that he had 17% Cheyne-Stokes respiration.  Positional AHI for supine sleep was 45.2/h and on the left lateral sleep position 16.5/h.  This is in sharp contrast to sleeping on her right side where the AHI was 11.4/h.  Snoring level was of average volume with a mean volume of 41 dB and only 11% of sleep were accompanied by snoring.                                                                Oxygen Saturation Statistics:   O2 Saturation Range (%): Oxygen saturation varied between a nadir at 83% and a maximum saturation of 98% and a mean oxygen saturation of 91%.    O2 Saturation (minutes) <89%:  Total time in hypoxemia was 21.8 minutes the  equivalent of 4% of total sleep time.                                           Pulse Rate Statistics: Pulse Range:    49-114 bpm, normal variability.              IMPRESSION:  This HST confirms the presence of severe sleep apnea with mild-moderate hypoxia and with stronger NREM AHI than REM AHI, making the presence of some complex and central apneas more likely.    RECOMMENDATION: The patient would need intervention by positive airway therapy,  but she may respond with an increase in non-obstructive apneas.  I like to have this patient titrated in the sleep lab, allowing [place] to switch to BiPAP or other modalities if necessary.  If insurance will not permit this, the Plan B is to start with an autotitration device, settings of  CPAP pressure ranging from 6-18 cm water, 3 cm EPR, heated humidification for machine and tubing, and mask of patient's comfort to be fitted. We will follow her here within 3 month of start of CPAP therapy.     INTERPRETING PHYSICIAN:   Larey Seat, MD   Medical Director of [facility] at [facility].

## 2020-11-18 ENCOUNTER — Telehealth: Payer: Self-pay | Admitting: Neurology

## 2020-11-18 ENCOUNTER — Other Ambulatory Visit: Payer: Self-pay | Admitting: Neurology

## 2020-11-18 DIAGNOSIS — G4730 Sleep apnea, unspecified: Secondary | ICD-10-CM | POA: Insufficient documentation

## 2020-11-18 DIAGNOSIS — G4733 Obstructive sleep apnea (adult) (pediatric): Secondary | ICD-10-CM

## 2020-11-18 NOTE — Progress Notes (Signed)
IMPRESSION:  This HST confirms the presence of severe sleep apnea with mild-moderate hypoxia and with stronger NREM AHI than REM AHI, making the presence of some complex and central apneas more likely.   RECOMMENDATION: The patient would need intervention by positive airway therapy,  but she may respond with an increase in non-obstructive apneas. The patient should sleep on her right side.  I like to have this patient titrated in the sleep lab, allowing [place] to switch to BiPAP or other modalities if necessary.  If insurance will not permit this, the Plan B is to start with an autotitration device, settings of CPAP pressure ranging from 6-18 cm water, 3 cm EPR, heated humidification for machine and tubing, and mask of patient's comfort to be fitted.  We will follow her here in the Sleep Clinic within 3 month of start of CPAP therapy.

## 2020-11-18 NOTE — Addendum Note (Signed)
Addended by: Larey Seat on: 11/18/2020 09:45 AM   Modules accepted: Orders

## 2020-11-18 NOTE — Telephone Encounter (Signed)
-----   Message from Larey Seat, MD sent at 11/18/2020  9:45 AM EDT ----- IMPRESSION:  This HST confirms the presence of severe sleep apnea with mild-moderate hypoxia and with stronger NREM AHI than REM AHI, making the presence of some complex and central apneas more likely.   RECOMMENDATION: The patient would need intervention by positive airway therapy,  but she may respond with an increase in non-obstructive apneas. The patient should sleep on her right side.  I like to have this patient titrated in the sleep lab, allowing [place] to switch to BiPAP or other modalities if necessary.  If insurance will not permit this, the Plan B is to start with an autotitration device, settings of CPAP pressure ranging from 6-18 cm water, 3 cm EPR, heated humidification for machine and tubing, and mask of patient's comfort to be fitted.  We will follow her here in the Sleep Clinic within 3 month of start of CPAP therapy.

## 2020-11-18 NOTE — Telephone Encounter (Signed)
I called pt. I advised pt that Dr. Brett Fairy reviewed their sleep study results and found that pt has severe sleep apnea. Dr. Brett Fairy recommends that pt starts auto CPAP. I reviewed PAP compliance expectations with the pt. Pt is agreeable to starting a CPAP. I advised pt that an order will be sent to a DME, [facility], and [facility] will call the pt within about one week after they file with the pt's insurance. [facility] will show the pt how to use the machine, fit for masks, and troubleshoot the CPAP if needed. A follow up appt was made for insurance purposes with Dr. Brett Fairy on 02/22/2021 at 3:30 pm. Pt verbalized understanding to arrive 15 minutes early and bring their CPAP. A letter with all of this information in it will be mailed to the pt as a reminder. I verified with the pt that the address we have on file is correct. Pt verbalized understanding of results. Pt had no questions at this time but was encouraged to call back if questions arise. I have sent the order to [facility] and have received confirmation that they have received the order.

## 2020-11-18 NOTE — Procedures (Signed)
[facility] TEST REPORT ( by Watch PAT)   STUDY DATA:  11-09-2020 DOB:  Jun 11, 1985    ORDERING CLINICIAN: Larey Seat, MD  REFERRING CLINICIAN:    CLINICAL INFORMATION/HISTORY: Jackie Smith is a 35 - year -old biracial female patient and was seen upon referral on 09/29/2020. She reported her husband had witnessed apneas and loud snoring, mostly when on her back. Medical history of Acid reflux, Bipolar 1 disorder ([place]), hyperglycemia, Frequent UTI, Gastritis, History of benign breast tumor, Infections of kidney, Irritable bowel syndrome (IBS), Thyroid disease, Type 2 diabetes mellitus ([place]) (05/02/2019), Nocturia 4-5, chronic Insomnia, Anxiety, Panic disorder.   Epworth sleepiness score: 12/24.   BMI: 44 kg/m   Neck Circumference:    FINDINGS:   Sleep Summary:   Total Recording Time (hours, min): The total recording time for this home sleep test amounted to 10 hours 11 minutes of which 9 hours were total sleep time with a REM sleep time of 12.5%.                                  Respiratory Indices:   Calculated pAHI (per hour):      The calculated AHI per hour was 39.9 , and AHI was higher during non-REM sleep than REM sleep.  REM AHI was 30.7/h and non-REM sleep AHI was 41.2/h.   This download stated a presence of 17% Cheyne-Stokes respiration.  Positional AHI for supine sleep was 45.2/h and on the left lateral sleep position 16.5/h.  This is in sharp contrast to sleeping on her right side where the AHI was 11.4/h.  Snoring level was of average volume with a mean volume of 41 dB and only 11% of sleep were accompanied by snoring.                                                                Oxygen Saturation Statistics:   O2 Saturation Range (%): Oxygen saturation varied between a nadir at 83% and a maximum saturation of 98% and a mean oxygen saturation of 91%.    O2 Saturation (minutes) <89%:  Total time in hypoxemia was 21.8 minutes the  equivalent of 4% of total sleep time.                                           Pulse Rate Statistics: Pulse Range:    49-114 bpm, normal variability.              IMPRESSION:  This HST confirms the presence of severe sleep apnea with mild-moderate hypoxia and with stronger NREM AHI than REM AHI, making the presence of some complex and central apneas more likely.    RECOMMENDATION: The patient would need intervention by positive airway therapy,  but she may respond with an increase in non-obstructive apneas. The patient should sleep on her right side.  I like to have this patient titrated in the sleep lab, allowing [place] to switch to BiPAP or other modalities if necessary.  If insurance will not permit this, the Plan B is to start  with an autotitration device, settings of CPAP pressure ranging from 6-18 cm water, 3 cm EPR, heated humidification for machine and tubing, and mask of patient's comfort to be fitted.  We will follow her here in the Sleep Clinic within 3 month of start of CPAP therapy.     INTERPRETING PHYSICIAN:   Larey Seat, MD   Medical Director of [facility] at [facility].

## 2020-11-24 DIAGNOSIS — H5213 Myopia, bilateral: Secondary | ICD-10-CM | POA: Diagnosis not present

## 2020-11-27 DIAGNOSIS — E782 Mixed hyperlipidemia: Secondary | ICD-10-CM | POA: Diagnosis not present

## 2020-11-27 DIAGNOSIS — K219 Gastro-esophageal reflux disease without esophagitis: Secondary | ICD-10-CM | POA: Diagnosis not present

## 2020-11-27 DIAGNOSIS — N914 Secondary oligomenorrhea: Secondary | ICD-10-CM | POA: Diagnosis not present

## 2020-11-27 DIAGNOSIS — R945 Abnormal results of liver function studies: Secondary | ICD-10-CM | POA: Diagnosis not present

## 2020-11-27 DIAGNOSIS — E8881 Metabolic syndrome: Secondary | ICD-10-CM | POA: Diagnosis not present

## 2020-11-27 DIAGNOSIS — E119 Type 2 diabetes mellitus without complications: Secondary | ICD-10-CM | POA: Diagnosis not present

## 2020-12-25 DIAGNOSIS — H52222 Regular astigmatism, left eye: Secondary | ICD-10-CM | POA: Diagnosis not present

## 2020-12-25 DIAGNOSIS — H524 Presbyopia: Secondary | ICD-10-CM | POA: Diagnosis not present

## 2020-12-29 ENCOUNTER — Other Ambulatory Visit: Payer: Self-pay

## 2020-12-29 ENCOUNTER — Ambulatory Visit
Admission: EM | Admit: 2020-12-29 | Discharge: 2020-12-29 | Disposition: A | Discharge status: Home / Self Care | Payer: No Typology Code available for payment source | Attending: Family Medicine | Admitting: Family Medicine

## 2020-12-29 DIAGNOSIS — R6883 Chills (without fever): Secondary | ICD-10-CM | POA: Diagnosis not present

## 2020-12-29 DIAGNOSIS — R109 Unspecified abdominal pain: Secondary | ICD-10-CM | POA: Diagnosis not present

## 2020-12-29 DIAGNOSIS — R5383 Other fatigue: Secondary | ICD-10-CM | POA: Diagnosis not present

## 2020-12-29 DIAGNOSIS — R195 Other fecal abnormalities: Secondary | ICD-10-CM | POA: Diagnosis not present

## 2020-12-29 MED ORDER — ONDANSETRON 4 MG PO TBDP: 4.0000 mg | ORAL_TABLET | Freq: Three times a day (TID) | ORAL | 0 refills | Status: DC | PRN

## 2020-12-29 NOTE — ED Triage Notes (Signed)
Pt presents with c/o diarrhea, abdominal cramping for past couple days. Pt states stool was black

## 2020-12-30 LAB — COMPREHENSIVE METABOLIC PANEL
ALT: 94 IU/L — ABNORMAL HIGH (ref 0–32)
AST: 55 IU/L — ABNORMAL HIGH (ref 0–40)
Albumin/Globulin Ratio: 1.8 (ref 1.2–2.2)
Albumin: 4.3 g/dL (ref 3.8–4.8)
Alkaline Phosphatase: 71 IU/L (ref 44–121)
BUN/Creatinine Ratio: 10 (ref 9–23)
BUN: 7 mg/dL (ref 6–20)
Bilirubin Total: 0.3 mg/dL (ref 0.0–1.2)
CO2: 19 mmol/L — ABNORMAL LOW (ref 20–29)
Calcium: 9.5 mg/dL (ref 8.7–10.2)
Chloride: 103 mmol/L (ref 96–106)
Creatinine, Ser: 0.72 mg/dL (ref 0.57–1.00)
Globulin, Total: 2.4 g/dL (ref 1.5–4.5)
Glucose: 83 mg/dL (ref 70–99)
Potassium: 4.3 mmol/L (ref 3.5–5.2)
Sodium: 138 mmol/L (ref 134–144)
Total Protein: 6.7 g/dL (ref 6.0–8.5)
eGFR: 110 mL/min/{1.73_m2} (ref >59)

## 2020-12-30 LAB — LIPASE: Lipase: 57 U/L (ref 14–72)

## 2020-12-30 LAB — CBC WITH DIFFERENTIAL/PLATELET
Basophils Absolute: 0.1 10*3/uL (ref 0.0–0.2)
Basos: 1 %
EOS (ABSOLUTE): 0.1 10*3/uL (ref 0.0–0.4)
Eos: 2 %
Hematocrit: 42.4 % (ref 34.0–46.6)
Hemoglobin: 14.3 g/dL (ref 11.1–15.9)
Immature Grans (Abs): 0 10*3/uL (ref 0.0–0.1)
Immature Granulocytes: 0 %
Lymphocytes Absolute: 2.5 10*3/uL (ref 0.7–3.1)
Lymphs: 35 %
MCH: 30.4 pg (ref 26.6–33.0)
MCHC: 33.7 g/dL (ref 31.5–35.7)
MCV: 90 fL (ref 79–97)
Monocytes Absolute: 0.5 10*3/uL (ref 0.1–0.9)
Monocytes: 7 %
Neutrophils Absolute: 3.9 10*3/uL (ref 1.4–7.0)
Neutrophils: 55 %
Platelets: 204 10*3/uL (ref 150–450)
RBC: 4.7 x10E6/uL (ref 3.77–5.28)
RDW: 14.1 % (ref 11.7–15.4)
WBC: 7 10*3/uL (ref 3.4–10.8)

## 2020-12-30 LAB — COVID-19, FLU A+B NAA
Influenza A, NAA: NOT DETECTED
Influenza B, NAA: NOT DETECTED
SARS-CoV-2, NAA: NOT DETECTED

## 2021-01-02 NOTE — ED Provider Notes (Signed)
[facility]    CSN: [id number] Arrival date & time: 12/29/20  1748      History   Chief Complaint Chief Complaint  Patient presents with   Abdominal Pain   Nausea   Diarrhea    HPI Jackie Smith is a 35 y.o. female.   Presenting today with several day history of upper abdominal cramping, diarrhea off and on, chills, fatigue, nausea, malaise. States her stools have been dark off and on. Denies fever, chills, cough, congestion, CP, SOB, vomiting. Takes prilosec for GERD with hx of gastritis and notes she frequently has diarrhea and abdominal cramping. No new foods, medications, recent travel, sick contacts. Takes iron supplement daily.    Past Medical History:  Diagnosis Date   Acid reflux    Bipolar 1 disorder ([place])    Frequent UTI    Gastritis    History of benign breast tumor    Infections of kidney    Irritable bowel syndrome (IBS)    Thyroid disease    Type 2 diabetes mellitus ([place]) 05/02/2019   Vaginal Pap smear, abnormal     Patient Active Problem List   Diagnosis Date Noted   Severe sleep apnea 11/18/2020   Hypothyroidism (acquired) 09/29/2020   Loud snoring 09/29/2020   GERD with apnea 09/29/2020   Hypersomnia with sleep apnea 09/29/2020   Type 2 diabetes mellitus without complication, without long-term current use of insulin ([place]) 09/29/2020   Morbid obesity with body mass index (BMI) of 40.0 to 44.9 in adult [facility]) 09/29/2020   Hypothyroidism 05/01/2019   Morbid obesity ([place]) 05/01/2019   Mild intermittent asthma 05/01/2019   Vitamin D deficiency 05/01/2019   Major depression 05/15/2014    Past Surgical History:  Procedure Laterality Date   c sections     DILATION AND CURETTAGE OF UTERUS     INDUCED ABORTION     LASER ABLATION OF THE CERVIX     TUBAL LIGATION      OB History     Gravida  5   Para  4   Term  3   Preterm  1   AB  1   Living  3      SAB  0   IAB  1   Ectopic      Multiple      Live  Births               Home Medications    Prior to Admission medications   Medication Sig Start Date End Date Taking? Authorizing Provider  ondansetron (ZOFRAN-ODT) 4 MG disintegrating tablet Take 1 tablet (4 mg total) by mouth every 8 (eight) hours as needed for nausea or vomiting. 12/29/20  Yes Volney American, PA-C  Cholecalciferol (VITAMIN D3) 5000 units CAPS Take 1 capsule by mouth daily.    [provider]  fluticasone (FLONASE) 50 MCG/ACT nasal spray Place 1 spray into both nostrils daily for 14 days. 03/05/20 03/19/20  Avegno, Darrelyn Hillock, FNP  metFORMIN (GLUCOPHAGE) 500 MG tablet Take 1 tablet (500 mg total) by mouth 2 (two) times daily with a meal. 08/28/55   Delora Fuel, MD  omeprazole (PRILOSEC) 20 MG capsule Take 20 mg by mouth daily. 09/22/20   [provider]    Family History Family History  Problem Relation Age of Onset   Drug abuse Mother    Cancer Mother        cervix   Bipolar disorder Father    Drug abuse Father  Heart disease Other    Arthritis Other    Cancer Other    Asthma Other    Diabetes Other    Depression Sister    Anxiety disorder Sister    Bipolar disorder Paternal Uncle     Social History Social History   Tobacco Use   Smoking status: Former    Packs/day: 0.25    Types: Cigarettes    Quit date: 04/26/2014    Years since quitting: 6.6   Smokeless tobacco: Never  Vaping Use   Vaping Use: Never used  Substance Use Topics   Alcohol use: No   Drug use: No     Allergies   Imitrex [sumatriptan base]   Review of Systems Review of Systems PER HPI   Physical Exam Triage Vital Signs ED Triage Vitals  Enc Vitals Group     BP 12/29/20 1855 100/73     Pulse Rate 12/29/20 1855 82     Resp 12/29/20 1855 18     Temp 12/29/20 1855 98.2 F (36.8 C)     Temp src --      SpO2 12/29/20 1855 95 %     Weight --      Height --      Head Circumference --      Peak Flow --      Pain Score 12/29/20 1853 6     Pain  Loc --      Pain Edu? --      Excl. in [place]? --    No data found.  Updated Vital Signs BP 100/73    Pulse 82    Temp 98.2 F (36.8 C)    Resp 18    SpO2 95%   Visual Acuity Right Eye Distance:   Left Eye Distance:   Bilateral Distance:    Right Eye Near:   Left Eye Near:    Bilateral Near:     Physical Exam Vitals and nursing note reviewed.  Constitutional:      Appearance: Normal appearance. She is not ill-appearing.  HENT:     Head: Atraumatic.     Mouth/Throat:     Mouth: Mucous membranes are moist.  Eyes:     Extraocular Movements: Extraocular movements intact.     Conjunctiva/sclera: Conjunctivae normal.  Cardiovascular:     Rate and Rhythm: Normal rate and regular rhythm.     Heart sounds: Normal heart sounds.  Pulmonary:     Effort: Pulmonary effort is normal.     Breath sounds: Normal breath sounds.  Abdominal:     General: Bowel sounds are normal. There is no distension.     Palpations: Abdomen is soft.     Tenderness: There is no abdominal tenderness. There is no right CVA tenderness, left CVA tenderness or guarding.  Musculoskeletal:        General: Normal range of motion.     Cervical back: Normal range of motion and neck supple.  Skin:    General: Skin is warm and dry.  Neurological:     Mental Status: She is alert and oriented to person, place, and time.  Psychiatric:        Mood and Affect: Mood normal.        Thought Content: Thought content normal.        Judgment: Judgment normal.   UC Treatments / Results  Labs (all labs ordered are listed, but only abnormal results are displayed) Labs Reviewed  COMPREHENSIVE METABOLIC PANEL - Abnormal; Notable for  the following components:      Result Value   CO2 19 (*)    AST 55 (*)    ALT 94 (*)    All other components within normal limits   Narrative:    Performed at:  [address], [place], [place]  [id number] Lab Director: Rush Farmer MD, Phone:  [phone]  COVID-19,  FLU A+B NAA   Narrative:    Performed at:  [address], [place], [place]  [id number] Lab Director: Rush Farmer MD, Phone:  [phone]  CBC WITH DIFFERENTIAL/PLATELET   Narrative:    Performed at:  [place] [address], [place], [place]  [id number] Lab Director: Rush Farmer MD, Phone:  [phone]  LIPASE   Narrative:    Performed at:  [address] [place] [address] [place], [place]  [id number] Lab Director: Rush Farmer MD, Phone:  [phone]    EKG   Radiology No results found.  Procedures Procedures (including critical care time)  Medications Ordered in UC Medications - No data to display  Initial Impression / Assessment and Plan / UC Course  I have reviewed the triage vital signs and the nursing notes.  Pertinent labs & imaging results that were available during my care of the patient were reviewed by me and considered in my medical decision making (see chart for details).     Vitals and exam very reassuring, COVID and flu testing and basic labs pending for safety. Treat with zofran, BRAT diet, fluids and strict return precautions. Suspect dark stools related to iron supplement.   Final Clinical Impressions(s) / UC Diagnoses   Final diagnoses:  Fatigue, unspecified type  Dark stools  Abdominal pain, unspecified abdominal location  Chills   Discharge Instructions   None    ED Prescriptions     Medication Sig Dispense Auth. Provider   ondansetron (ZOFRAN-ODT) 4 MG disintegrating tablet Take 1 tablet (4 mg total) by mouth every 8 (eight) hours as needed for nausea or vomiting. 20 tablet Volney American, [place]      PDMP not reviewed this encounter.   Volney American, [place] 01/02/21 2203

## 2021-01-31 ENCOUNTER — Other Ambulatory Visit: Payer: Self-pay

## 2021-01-31 DIAGNOSIS — R109 Unspecified abdominal pain: Secondary | ICD-10-CM | POA: Diagnosis not present

## 2021-01-31 DIAGNOSIS — M545 Low back pain, unspecified: Secondary | ICD-10-CM | POA: Insufficient documentation

## 2021-01-31 DIAGNOSIS — R110 Nausea: Secondary | ICD-10-CM | POA: Diagnosis not present

## 2021-02-01 ENCOUNTER — Other Ambulatory Visit: Payer: Self-pay

## 2021-02-01 ENCOUNTER — Emergency Department (HOSPITAL_BASED_OUTPATIENT_CLINIC_OR_DEPARTMENT_OTHER)
Admission: EM | Admit: 2021-02-01 | Discharge: 2021-02-01 | Disposition: A | Discharge status: Home / Self Care | Payer: No Typology Code available for payment source | Attending: Emergency Medicine | Admitting: Emergency Medicine

## 2021-02-01 ENCOUNTER — Encounter (HOSPITAL_BASED_OUTPATIENT_CLINIC_OR_DEPARTMENT_OTHER): Payer: Self-pay | Admitting: Emergency Medicine

## 2021-02-01 ENCOUNTER — Emergency Department (HOSPITAL_BASED_OUTPATIENT_CLINIC_OR_DEPARTMENT_OTHER): Payer: No Typology Code available for payment source

## 2021-02-01 DIAGNOSIS — M545 Low back pain, unspecified: Secondary | ICD-10-CM | POA: Diagnosis not present

## 2021-02-01 LAB — URINALYSIS, ROUTINE W REFLEX MICROSCOPIC
Bilirubin Urine: NEGATIVE
Glucose, UA: NEGATIVE mg/dL
Hgb urine dipstick: NEGATIVE
Ketones, ur: NEGATIVE mg/dL
Leukocytes,Ua: NEGATIVE
Nitrite: NEGATIVE
Protein, ur: NEGATIVE mg/dL
Specific Gravity, Urine: 1.02 (ref 1.005–1.030)
pH: 7 (ref 5.0–8.0)

## 2021-02-01 LAB — CBC WITH DIFFERENTIAL/PLATELET
Abs Immature Granulocytes: 0.01 10*3/uL (ref 0.00–0.07)
Basophils Absolute: 0 10*3/uL (ref 0.0–0.1)
Basophils Relative: 1 %
Eosinophils Absolute: 0 10*3/uL (ref 0.0–0.5)
Eosinophils Relative: 1 %
HCT: 35.9 % — ABNORMAL LOW (ref 36.0–46.0)
Hemoglobin: 12.6 g/dL (ref 12.0–15.0)
Immature Granulocytes: 0 %
Lymphocytes Relative: 36 %
Lymphs Abs: 1.3 10*3/uL (ref 0.7–4.0)
MCH: 31.6 pg (ref 26.0–34.0)
MCHC: 35.1 g/dL (ref 30.0–36.0)
MCV: 90 fL (ref 80.0–100.0)
Monocytes Absolute: 0.6 10*3/uL (ref 0.1–1.0)
Monocytes Relative: 17 %
Neutro Abs: 1.7 10*3/uL (ref 1.7–7.7)
Neutrophils Relative %: 45 %
Platelets: 149 10*3/uL — ABNORMAL LOW (ref 150–400)
RBC: 3.99 MIL/uL (ref 3.87–5.11)
RDW: 14.6 % (ref 11.5–15.5)
WBC: 3.6 10*3/uL — ABNORMAL LOW (ref 4.0–10.5)
nRBC: 0 % (ref 0.0–0.2)

## 2021-02-01 LAB — COMPREHENSIVE METABOLIC PANEL
ALT: 77 U/L — ABNORMAL HIGH (ref 0–44)
AST: 61 U/L — ABNORMAL HIGH (ref 15–41)
Albumin: 3.4 g/dL — ABNORMAL LOW (ref 3.5–5.0)
Alkaline Phosphatase: 47 U/L (ref 38–126)
Anion gap: 8 (ref 5–15)
BUN: 10 mg/dL (ref 6–20)
CO2: 22 mmol/L (ref 22–32)
Calcium: 8.6 mg/dL — ABNORMAL LOW (ref 8.9–10.3)
Chloride: 104 mmol/L (ref 98–111)
Creatinine, Ser: 0.72 mg/dL (ref 0.44–1.00)
GFR, Estimated: >60 mL/min (ref >60)
Glucose, Bld: 91 mg/dL (ref 70–99)
Potassium: 3.8 mmol/L (ref 3.5–5.1)
Sodium: 134 mmol/L — ABNORMAL LOW (ref 135–145)
Total Bilirubin: 0.4 mg/dL (ref 0.3–1.2)
Total Protein: 6.2 g/dL — ABNORMAL LOW (ref 6.5–8.1)

## 2021-02-01 LAB — D-DIMER, QUANTITATIVE: D-Dimer, Quant: 0.29 ug/mL-FEU (ref 0.00–0.50)

## 2021-02-01 LAB — LIPASE, BLOOD: Lipase: 81 U/L — ABNORMAL HIGH (ref 11–51)

## 2021-02-01 MED ORDER — IOHEXOL 300 MG/ML  SOLN
100.0000 mL | Freq: Once | INTRAMUSCULAR | Status: AC | PRN
  Administered 2021-02-01: 100 mL via INTRAVENOUS

## 2021-02-01 MED ORDER — ONDANSETRON 4 MG PO TBDP: 4.0000 mg | ORAL_TABLET | Freq: Three times a day (TID) | ORAL | 0 refills | Status: DC | PRN

## 2021-02-01 MED ORDER — LACTATED RINGERS IV BOLUS
1000.0000 mL | Freq: Once | INTRAVENOUS | Status: AC
  Administered 2021-02-01: 1000 mL via INTRAVENOUS

## 2021-02-01 MED ORDER — ONDANSETRON HCL 4 MG/2ML IJ SOLN
4.0000 mg | Freq: Once | INTRAMUSCULAR | Status: AC
  Administered 2021-02-01: 4 mg via INTRAVENOUS
  Filled 2021-02-01: qty 2

## 2021-02-01 MED ORDER — HYDROMORPHONE HCL 1 MG/ML IJ SOLN
1.0000 mg | Freq: Once | INTRAMUSCULAR | Status: AC
  Administered 2021-02-01: 1 mg via INTRAVENOUS
  Filled 2021-02-01: qty 1

## 2021-02-01 MED ORDER — OXYCODONE-ACETAMINOPHEN 5-325 MG PO TABS
2.0000 | ORAL_TABLET | Freq: Once | ORAL | Status: AC
  Administered 2021-02-01: 2 via ORAL
  Filled 2021-02-01: qty 2

## 2021-02-01 MED ORDER — OXYCODONE-ACETAMINOPHEN 5-325 MG PO TABS: 1.0000 | ORAL_TABLET | Freq: Three times a day (TID) | ORAL | 0 refills | Status: DC | PRN

## 2021-02-01 NOTE — ED Notes (Signed)
Patient ambulatory to bathroom.

## 2021-02-01 NOTE — ED Notes (Signed)
Patient discharged to home.  All discharge instructions reviewed.  Patient verbalized understanding via teachback method.  VS WDL.  Respirations even and unlabored.  Ambulatory out of ED.   °

## 2021-02-01 NOTE — ED Provider Notes (Signed)
[facility] EMERGENCY DEPARTMENT Provider Note   CSN: [id number] Arrival date & time: 01/31/21  2356     History  Chief Complaint  Patient presents with   Back Pain    Jackie Smith is a 36 y.o. female.  36 year old female who presents to the emergency department today secondary to flank pain.  Patient states that she started with some right-sided back pain a few days ago then migrated to her left.  She went to [facility] yesterday where labs were done that showed elevated lipase.  CT scan without contrast was done without any evidence of kidney stone.  Did not check her urine.  She states he just discharged her without any explanation.  She presents here for worsening symptoms.  Has had some nausea no vomiting.  No other urinary symptoms.   Back Pain     Home Medications Prior to Admission medications   Medication Sig Start Date End Date Taking? Authorizing Provider  ondansetron (ZOFRAN-ODT) 4 MG disintegrating tablet Take 1 tablet (4 mg total) by mouth every 8 (eight) hours as needed. 4mg  ODT q4 hours prn nausea/vomit 02/01/21  Yes Hallelujah Wysong, Corene Cornea, MD  oxyCODONE-acetaminophen (PERCOCET) 5-325 MG tablet Take 1 tablet by mouth every 8 (eight) hours as needed. 02/01/21  Yes Bernardino Dowell, Corene Cornea, MD  Cholecalciferol (VITAMIN D3) 5000 units CAPS Take 1 capsule by mouth daily.    [provider]  fluticasone (FLONASE) 50 MCG/ACT nasal spray Place 1 spray into both nostrils daily for 14 days. 03/05/20 03/19/20  Avegno, Darrelyn Hillock, FNP  metFORMIN (GLUCOPHAGE) 500 MG tablet Take 1 tablet (500 mg total) by mouth 2 (two) times daily with a meal. 7/62/83   Delora Fuel, MD  omeprazole (PRILOSEC) 20 MG capsule Take 20 mg by mouth daily. 09/22/20   [provider]      Allergies    Imitrex [sumatriptan base]    Review of Systems   Review of Systems  Musculoskeletal:  Positive for back pain.  All other systems reviewed and are negative.  Physical Exam Updated  Vital Signs BP 105/60    Pulse 70    Temp 98.1 F (36.7 C) (Oral)    Resp 16    Ht 5\' 4"  (1.626 m)    Wt 101.2 kg    SpO2 93%    BMI 38.28 kg/m  Physical Exam Vitals and nursing note reviewed.  Constitutional:      Appearance: She is well-developed.  HENT:     Head: Normocephalic and atraumatic.     Nose: Nose normal. No congestion or rhinorrhea.     Mouth/Throat:     Mouth: Mucous membranes are moist.     Pharynx: Oropharynx is clear.  Eyes:     Pupils: Pupils are equal, round, and reactive to light.  Cardiovascular:     Rate and Rhythm: Normal rate and regular rhythm.  Pulmonary:     Effort: No respiratory distress.     Breath sounds: No stridor.  Abdominal:     General: Abdomen is flat. There is no distension.  Musculoskeletal:        General: No swelling or tenderness. Normal range of motion.     Cervical back: Normal range of motion.  Skin:    General: Skin is warm and dry.  Neurological:     General: No focal deficit present.     Mental Status: She is alert.    ED Results / Procedures / Treatments   Labs (all labs ordered are  listed, but only abnormal results are displayed) Labs Reviewed  CBC WITH DIFFERENTIAL/PLATELET - Abnormal; Notable for the following components:      Result Value   WBC 3.6 (*)    HCT 35.9 (*)    Platelets 149 (*)    All other components within normal limits  COMPREHENSIVE METABOLIC PANEL - Abnormal; Notable for the following components:   Sodium 134 (*)    Calcium 8.6 (*)    Total Protein 6.2 (*)    Albumin 3.4 (*)    AST 61 (*)    ALT 77 (*)    All other components within normal limits  LIPASE, BLOOD - Abnormal; Notable for the following components:   Lipase 81 (*)    All other components within normal limits  URINALYSIS, ROUTINE W REFLEX MICROSCOPIC - Abnormal; Notable for the following components:   APPearance HAZY (*)    All other components within normal limits  D-DIMER, QUANTITATIVE    EKG None  Radiology CT ABDOMEN  PELVIS W CONTRAST  Result Date: 02/01/2021 CLINICAL DATA:  Abdominal pain. EXAM: CT ABDOMEN AND PELVIS WITH CONTRAST TECHNIQUE: Multidetector CT imaging of the abdomen and pelvis was performed using the standard protocol following bolus administration of intravenous contrast. RADIATION DOSE REDUCTION: This exam was performed according to the departmental dose-optimization program which includes automated exposure control, adjustment of the mA and/or kV according to patient size and/or use of iterative reconstruction technique. CONTRAST:  142mL OMNIPAQUE IOHEXOL 300 MG/ML  SOLN COMPARISON:  03/31/2017 FINDINGS: Lower chest: No acute abnormality. Hepatobiliary: No focal liver abnormality. Gallbladder appears within normal limits. No bile duct dilatation. Pancreas: Unremarkable. No pancreatic ductal dilatation or surrounding inflammatory changes. Spleen: Multiple calcified granulomas identified within the spleen. Adrenals/Urinary Tract: Right adrenal nodule is again noted and appears unchanged measuring 2.5 cm, image 24/2. This is compatible with a benign adenoma. No kidney stones or hydronephrosis identified bilaterally. No hydroureter or ureteral lithiasis. Urinary bladder appears unremarkable. Stomach/Bowel: Stomach appears normal. The appendix is visualized and appears normal. No bowel wall thickening, inflammation or distension. Vascular/Lymphatic: No significant vascular findings are present. No enlarged abdominal or pelvic lymph nodes. Reproductive: Uterus and bilateral adnexa are unremarkable. Other: No free fluid or fluid collections. Musculoskeletal: No acute or significant osseous findings. IMPRESSION: 1. No acute findings within the abdomen or pelvis. 2. Stable right adrenal adenoma. Electronically Signed   By: Kerby Moors M.D.   On: 02/01/2021 05:33    Procedures Procedures    Medications Ordered in ED Medications  HYDROmorphone (DILAUDID) injection 1 mg (1 mg Intravenous Given 02/01/21 0234)   ondansetron (ZOFRAN) injection 4 mg (4 mg Intravenous Given 02/01/21 0234)  lactated ringers bolus 1,000 mL (0 mLs Intravenous Stopped 02/01/21 0339)  iohexol (OMNIPAQUE) 300 MG/ML solution 100 mL (100 mLs Intravenous Contrast Given 02/01/21 0437)  oxyCODONE-acetaminophen (PERCOCET/ROXICET) 5-325 MG per tablet 2 tablet (2 tablets Oral Given 02/01/21 0548)    ED Course/ Medical Decision Making/ A&P                           Medical Decision Making  Patient appears to have had a either lipemia secondary to dehydration versus acute pancreatitis yesterday so we will recheck labs.  We will also evaluate with CT scan with contrast to evaluate for pyelonephritis, renal abscess, cholecystitis, pancreatitis or other emergent causes for symptoms.  CT scan and work-up overall reassuring.  Overall improved from yesterday.  We will work on symptom control  at this time.  Symptoms improved.  Will start short course of pain medication at home with nausea medication.  Will follow with PCP in the week to make sure things are improving.   Final Clinical Impression(s) / ED Diagnoses Final diagnoses:  Acute bilateral low back pain, unspecified whether sciatica present    Rx / DC Orders ED Discharge Orders          Ordered    oxyCODONE-acetaminophen (PERCOCET) 5-325 MG tablet  Every 8 hours PRN        02/01/21 0619    ondansetron (ZOFRAN-ODT) 4 MG disintegrating tablet  Every 8 hours PRN        02/01/21 0619              Catherine Oak, Corene Cornea, MD 02/01/21 [phone]

## 2021-02-01 NOTE — ED Notes (Addendum)
Patient transported to CT 

## 2021-02-01 NOTE — ED Triage Notes (Signed)
Reports right lower back pain for the last few weeks. Wasn't sure if it was from working out.  But then last night started running a fever, dizzy, and poor appetite.  Seen at [facility] home with ibuprofen and phenergan.  Reports she isn't feeling any better.

## 2021-02-19 DIAGNOSIS — G4733 Obstructive sleep apnea (adult) (pediatric): Secondary | ICD-10-CM | POA: Diagnosis not present

## 2021-02-22 ENCOUNTER — Ambulatory Visit: Payer: No Typology Code available for payment source | Admitting: Neurology

## 2021-02-22 ENCOUNTER — Ambulatory Visit: Payer: No Typology Code available for payment source | Admitting: Adult Health

## 2021-04-02 DIAGNOSIS — G4733 Obstructive sleep apnea (adult) (pediatric): Secondary | ICD-10-CM | POA: Diagnosis not present

## 2021-04-02 DIAGNOSIS — E282 Polycystic ovarian syndrome: Secondary | ICD-10-CM | POA: Diagnosis not present

## 2021-04-02 DIAGNOSIS — Z6835 Body mass index (BMI) 35.0-35.9, adult: Secondary | ICD-10-CM | POA: Diagnosis not present

## 2021-04-02 DIAGNOSIS — G4700 Insomnia, unspecified: Secondary | ICD-10-CM | POA: Diagnosis not present

## 2021-04-02 DIAGNOSIS — K2960 Other gastritis without bleeding: Secondary | ICD-10-CM | POA: Diagnosis not present

## 2021-04-12 ENCOUNTER — Encounter (HOSPITAL_COMMUNITY): Payer: Self-pay

## 2021-04-12 ENCOUNTER — Ambulatory Visit (HOSPITAL_COMMUNITY)
Admission: RE | Admit: 2021-04-12 | Discharge: 2021-04-12 | Disposition: A | Discharge status: Home / Self Care | Payer: No Typology Code available for payment source | Source: Ambulatory Visit | Attending: Family Medicine | Admitting: Family Medicine

## 2021-04-12 ENCOUNTER — Inpatient Hospital Stay (HOSPITAL_COMMUNITY): Admission: RE | Admit: 2021-04-12 | Payer: No Typology Code available for payment source | Source: Ambulatory Visit

## 2021-04-12 ENCOUNTER — Other Ambulatory Visit (HOSPITAL_COMMUNITY): Payer: Self-pay | Admitting: Family Medicine

## 2021-04-12 ENCOUNTER — Other Ambulatory Visit: Payer: Self-pay

## 2021-04-12 DIAGNOSIS — Z1231 Encounter for screening mammogram for malignant neoplasm of breast: Secondary | ICD-10-CM

## 2021-04-28 ENCOUNTER — Encounter: Payer: Self-pay | Admitting: Adult Health

## 2021-04-28 ENCOUNTER — Other Ambulatory Visit (HOSPITAL_COMMUNITY)
Admission: RE | Admit: 2021-04-28 | Discharge: 2021-04-28 | Disposition: A | Discharge status: Home / Self Care | Payer: No Typology Code available for payment source | Source: Ambulatory Visit | Attending: Adult Health | Admitting: Adult Health

## 2021-04-28 ENCOUNTER — Ambulatory Visit (INDEPENDENT_AMBULATORY_CARE_PROVIDER_SITE_OTHER): Payer: No Typology Code available for payment source | Admitting: Adult Health

## 2021-04-28 VITALS — BP 115/79 | HR 65 | Ht 64.0 in | Wt 202.0 lb

## 2021-04-28 DIAGNOSIS — Z01419 Encounter for gynecological examination (general) (routine) without abnormal findings: Secondary | ICD-10-CM | POA: Insufficient documentation

## 2021-04-28 DIAGNOSIS — D229 Melanocytic nevi, unspecified: Secondary | ICD-10-CM | POA: Insufficient documentation

## 2021-04-28 NOTE — Progress Notes (Signed)
Patient ID: Jackie Smith, female   DOB: 1986/01/12, 36 y.o.   MRN: [id number] ?History of Present Illness: ?Jackie Smith is a 36 year old white female,married, [id number], in for a well woman gyn exam and pap. She is going to nursing school. ?PCP is [facility].  ? ? ?Current Medications, Allergies, Past Medical History, Past Surgical History, Family History and Social History were reviewed in [employer] record.   ? ? ?Review of Systems: ? ?Patient denies any headaches, hearing loss, fatigue, blurred vision, shortness of breath, chest pain, abdominal pain, problems with bowel movements, urination, or intercourse. No joint pain or mood swings.  ? ? ?Physical Exam:BP 115/79 (BP Location: Right Arm, Patient Position: Sitting, Cuff Size: Normal)   Pulse 65   Ht '5\' 4"'$  (1.626 m)   Wt 202 lb (91.6 kg)   BMI 34.67 kg/m?   ?General:  Well developed, well nourished, no acute distress ?Skin:  Warm and dry ?Neck:  Midline trachea, normal thyroid, good ROM, no lymphadenopathy ?Lungs; Clear to auscultation bilaterally ?Breast:  No dominant palpable mass, retraction, or nipple discharge ?Cardiovascular: Regular rate and rhythm ?Abdomen:  Soft, non tender, no hepatosplenomegaly ?Pelvic:  External genitalia is normal in appearance, she has irregular,lobular mole right buttock, and has some color variations.  The vagina is normal in appearance. Urethra has no lesions or masses. The cervix is bulbous.Pap with GC/CHL and HR HPV genotyping performed.  Uterus is felt to be normal size, shape, and contour.  No adnexal masses or tenderness noted.Bladder is non tender, no masses felt. ?Rectal is deferred.  ?Extremities/musculoskeletal:  No swelling or varicosities noted, no clubbing or cyanosis ?Psych:  No mood changes, alert and cooperative,seems happy ?AA is 4 ?Fall risk is low ? ?  04/28/2021  ?  1:32 PM 05/01/2019  ?  3:20 PM 03/07/2018  ?  2:21 PM  ?Depression screen PHQ 2/9  ?Decreased Interest 0 3 0  ?Down,  Depressed, Hopeless 0 0 0  ?PHQ - 2 Score 0 3 0  ?Altered sleeping 0 3   ?Tired, decreased energy 0 3   ?Change in appetite 0 1   ?Feeling bad or failure about yourself  0 0   ?Trouble concentrating 0 0   ?Moving slowly or fidgety/restless 0 0   ?Suicidal thoughts 0 0   ?PHQ-9 Score 0 10   ?  ? ?  04/28/2021  ?  1:32 PM  ?GAD 7 : Generalized Anxiety Score  ?Nervous, Anxious, on Edge 0  ?Control/stop worrying 0  ?Worry too much - different things 0  ?Trouble relaxing 0  ?Restless 0  ?Easily annoyed or irritable 0  ?Afraid - awful might happen 0  ?Total GAD 7 Score 0  ? ?  ? [facility] - 04/28/21 1332   ? ?  ? Pregnancy Intention Screening  ? Does the patient want to become pregnant in the next year? No   ? Does the patient's partner want to become pregnant in the next year? No   ? Would the patient like to discuss contraceptive options today? No   ?  ? Contraception Wrap Up  ? Current Method Female Sterilization   ablation  ? End Method Female Sterilization   ablation  ? Contraception Counseling Provided No   ? ?  ?  ? ?  ?  ?Co exam with Balinda Quails LPN ? ? ?Impression and Plan: ? ?1. Encounter for gynecological examination with Papanicolaou smear of cervix ?Pap sent ?Physical in 1 year ?  Pap in 3 years if normal ? ? ?2. Change in mole ?Return about 05/27/21 for mole removal  ? ? ?  ?  ?

## 2021-05-03 LAB — CYTOLOGY - PAP
Chlamydia: NEGATIVE
Comment: NEGATIVE
Comment: NEGATIVE
Comment: NORMAL
Diagnosis: NEGATIVE
High risk HPV: NEGATIVE
Neisseria Gonorrhea: NEGATIVE

## 2021-05-04 ENCOUNTER — Other Ambulatory Visit: Payer: Self-pay | Admitting: Women's Health

## 2021-05-04 MED ORDER — FLUCONAZOLE 150 MG PO TABS: 150.0000 mg | ORAL_TABLET | Freq: Once | ORAL | 0 refills | Status: AC

## 2021-05-10 ENCOUNTER — Ambulatory Visit
Admission: EM | Admit: 2021-05-10 | Discharge: 2021-05-10 | Disposition: A | Discharge status: Home / Self Care | Payer: No Typology Code available for payment source | Attending: Nurse Practitioner | Admitting: Nurse Practitioner

## 2021-05-10 ENCOUNTER — Encounter: Payer: Self-pay | Admitting: Nurse Practitioner

## 2021-05-10 DIAGNOSIS — S76212A Strain of adductor muscle, fascia and tendon of left thigh, initial encounter: Secondary | ICD-10-CM

## 2021-05-10 MED ORDER — IBUPROFEN 800 MG PO TABS: 800.0000 mg | ORAL_TABLET | Freq: Three times a day (TID) | ORAL | 0 refills | Status: DC | PRN

## 2021-05-10 NOTE — ED Triage Notes (Signed)
Pt presents with nasal congestion and left lower back pain radiating to left leg for past few dys  ?

## 2021-05-10 NOTE — Discharge Instructions (Addendum)
Take medication as prescribed.  Take 1 tablet every 8 hours for the next 48 hours.  Take medication with food and water.  Thereafter, take 1 tablet every 8 hours as needed. ?Continue Epsom salt soaks at least 1-2 times daily until symptoms improve. ?Perform stretching and gentle range of motion exercises until symptoms improve. ?Apply ice to the affected area for pain or swelling, heat for stiffness or spasm.  Apply for 20 minutes, remove for 1 hour, then repeat. ?Your symptoms may take 2 to 4 weeks to heal.  Follow-up after that time if symptoms do not improve. ?

## 2021-05-11 NOTE — ED Provider Notes (Signed)
[facility]    CSN: [id number] Arrival date & time: 05/10/21  1810      History   Chief Complaint Chief Complaint  Patient presents with   Nasal Congestion   Leg Pain    HPI Jackie Smith is a 36 y.o. female.   The patient is a 36 year old female who presents with pain in the left groin.  Symptoms started approximately 2 days ago.  Patient states that her pain initially started in her back a few days prior but then she developed pain in the groin of her left leg approximately 2 days ago.  Patient does not recall any strenuous activity, but she does report that she did have a night of drinking then that she may not remember exactly what was going on.  The left groin is tender, with pain that radiates down into the left inner thigh.  She states that she has difficulty moving her leg, and "feels like all woman" when she got out of the car.  She denies fever, chills, swelling, inability to bear weight.  He does report that she does have some numbness that radiates down below the knee.  The history is provided by the patient.  Leg Pain Pain details:    Radiates to:  Suprapubic region and groin   Severity:  Moderate   Progression:  Waxing and waning Chronicity:  New Relieved by:  NSAIDs Worsened by:  Abduction, adduction, flexion, rotation and activity Associated symptoms: decreased ROM and numbness   Associated symptoms: no back pain, no fever, no stiffness and no swelling    Past Medical History:  Diagnosis Date   Acid reflux    Bipolar 1 disorder (HCC)    Frequent UTI    Gastritis    History of benign breast tumor    Infections of kidney    Irritable bowel syndrome (IBS)    Thyroid disease    Type 2 diabetes mellitus (HCC) 05/02/2019   Vaginal Pap smear, abnormal     Patient Active Problem List   Diagnosis Date Noted   Change in mole 04/28/2021   Encounter for gynecological examination with Papanicolaou smear of cervix 04/28/2021   Severe sleep  apnea 11/18/2020   Hypothyroidism (acquired) 09/29/2020   Loud snoring 09/29/2020   GERD with apnea 09/29/2020   Hypersomnia with sleep apnea 09/29/2020   Type 2 diabetes mellitus without complication, without long-term current use of insulin (HCC) 09/29/2020   Morbid obesity with body mass index (BMI) of 40.0 to 44.9 in adult [facility]) 09/29/2020   Hypothyroidism 05/01/2019   Morbid obesity (HCC) 05/01/2019   Mild intermittent asthma 05/01/2019   Vitamin D deficiency 05/01/2019   Major depression 05/15/2014    Past Surgical History:  Procedure Laterality Date   c sections     DILATION AND CURETTAGE OF UTERUS     INDUCED ABORTION     LASER ABLATION OF THE CERVIX     TUBAL LIGATION      OB History     Gravida  5   Para  4   Term  3   Preterm  1   AB  1   Living  3      SAB  0   IAB  1   Ectopic      Multiple      Live Births               Home Medications    Prior to Admission medications   Medication  Sig Start Date End Date Taking? Authorizing Provider  ibuprofen (ADVIL) 800 MG tablet Take 1 tablet (800 mg total) by mouth every 8 (eight) hours as needed. 05/10/21  Yes Leath-Warren, Sadie Haber, NP  cholecalciferol (VITAMIN D3) 25 MCG (1000 UNIT) tablet Take 1,000 Units by mouth daily.    [provider]  ferrous sulfate 324 MG TBEC Take 324 mg by mouth.    [provider]  fluticasone (FLONASE) 50 MCG/ACT nasal spray Place 1 spray into both nostrils daily for 14 days. 03/05/20 03/19/20  Avegno, Zachery Dakins, FNP  magnesium 30 MG tablet Take 30 mg by mouth 2 (two) times daily.    [provider]  MOUNJARO 5 MG/0.5ML Pen Inject into the skin. 01/15/21   [provider]  vitamin B-12 (CYANOCOBALAMIN) 500 MCG tablet Take 500 mcg by mouth daily.    [provider]    Family History Family History  Problem Relation Age of Onset   Drug abuse Mother    Cancer Mother        cervix   Bipolar disorder Father    Drug  abuse Father    Heart disease Other    Arthritis Other    Cancer Other    Asthma Other    Diabetes Other    Depression Sister    Anxiety disorder Sister    Bipolar disorder Paternal Uncle     Social History Social History   Tobacco Use   Smoking status: Former    Packs/day: 0.25    Types: Cigarettes    Quit date: 04/26/2014    Years since quitting: 7.0   Smokeless tobacco: Never  Vaping Use   Vaping Use: Never used  Substance Use Topics   Alcohol use: No   Drug use: No     Allergies   Imitrex [sumatriptan base]   Review of Systems Review of Systems  Constitutional: Negative.  Negative for fever.  Respiratory: Negative.    Cardiovascular: Negative.   Gastrointestinal: Negative.   Musculoskeletal:  Negative for back pain and stiffness.  Skin: Negative.   Psychiatric/Behavioral: Negative.      Physical Exam Triage Vital Signs ED Triage Vitals  Enc Vitals Group     BP 05/10/21 1940 136/81     Pulse Rate 05/10/21 1940 80     Resp 05/10/21 1940 18     Temp 05/10/21 1940 98.5 F (36.9 C)     Temp Source 05/10/21 1940 Oral     SpO2 05/10/21 1940 97 %     Weight --      Height --      Head Circumference --      Peak Flow --      Pain Score 05/10/21 1959 7     Pain Loc --      Pain Edu? --      Excl. in GC? --    No data found.  Updated Vital Signs BP 136/81   Pulse 80   Temp 98.5 F (36.9 C) (Oral)   Resp 18   SpO2 97%   Visual Acuity Right Eye Distance:   Left Eye Distance:   Bilateral Distance:    Right Eye Near:   Left Eye Near:    Bilateral Near:     Physical Exam Vitals reviewed.  Constitutional:      Appearance: Normal appearance.  HENT:     Head: Normocephalic.  Cardiovascular:     Rate and Rhythm: Normal rate and regular rhythm.  Pulses: Normal pulses.     Heart sounds: Normal heart sounds.  Pulmonary:     Effort: Pulmonary effort is normal.     Breath sounds: Normal breath sounds.  Abdominal:     Palpations: Abdomen is  soft.     Tenderness: There is no abdominal tenderness.     Hernia: No hernia is present.     Comments: Moderate tenderness in the left groin. Tenderness noted to medial left thigh. Pain with all movement in all planes. No swelling noted on exam.  Musculoskeletal:     Cervical back: Normal range of motion.  Neurological:     Mental Status: She is alert.     UC Treatments / Results  Labs (all labs ordered are listed, but only abnormal results are displayed) Labs Reviewed - No data to display  EKG   Radiology No results found.  Procedures Procedures (including critical care time)  Medications Ordered in UC Medications - No data to display  Initial Impression / Assessment and Plan / UC Course  I have reviewed the triage vital signs and the nursing notes.  Pertinent labs & imaging results that were available during my care of the patient were reviewed by me and considered in my medical decision making (see chart for details).  The patient is a 36 year old female who presents with pain in the left groin.  She reports the symptoms started approximately 2 days ago.  Patient does not recall any specific injury or trauma, but states that she was drinking and that she may not have a complete recollection of what happened.  Today she has pain in the groin that radiates down into the left thigh.  Symptoms are worsened with movement, ambulation, and range of abductor muscle strain..  Patient was advised to refrain from any strenuous activity until her symptoms improve.  Symptomatic treatment to include ice to the affected area 3-4 times daily.  Also recommended Epsom salt soaks.  Patient was advised to take ibuprofen every 8 hours for the next 48 hours to help with pain and inflammation.  Also recommended gentle range of motion exercises.  Patient advised that symptoms can take 2 to 3 weeks before she notices improvement.  Patient advised to follow-up if symptoms do not improve. Final Clinical  Impressions(s) / UC Diagnoses   Final diagnoses:  Strain of groin, left, initial encounter     Discharge Instructions      Take medication as prescribed.  Take 1 tablet every 8 hours for the next 48 hours.  Take medication with food and water.  Thereafter, take 1 tablet every 8 hours as needed. Continue Epsom salt soaks at least 1-2 times daily until symptoms improve. Perform stretching and gentle range of motion exercises until symptoms improve. Apply ice to the affected area for pain or swelling, heat for stiffness or spasm.  Apply for 20 minutes, remove for 1 hour, then repeat. Your symptoms may take 2 to 4 weeks to heal.  Follow-up after that time if symptoms do not improve.    ED Prescriptions     Medication Sig Dispense Auth. Provider   ibuprofen (ADVIL) 800 MG tablet Take 1 tablet (800 mg total) by mouth every 8 (eight) hours as needed. 30 tablet Leath-Warren, Sadie Haber, NP      PDMP not reviewed this encounter.   Abran Cantor, NP 05/11/21 2047

## 2021-05-13 ENCOUNTER — Ambulatory Visit: Payer: No Typology Code available for payment source | Admitting: Neurology

## 2021-05-24 ENCOUNTER — Telehealth: Payer: Self-pay | Admitting: *Deleted

## 2021-05-24 MED ORDER — HYDROXYZINE PAMOATE 25 MG PO CAPS: ORAL_CAPSULE | ORAL | 0 refills | Status: DC

## 2021-05-24 NOTE — Telephone Encounter (Signed)
She is aware rx for vistaril sent in, can take prior to mole removal, do not drive ?

## 2021-05-24 NOTE — Telephone Encounter (Signed)
Pt sent message through appointment scheduling that she would like something sent in for anxiety for her mole removal with [facility] this week. She just wants something to take before her visit. Will route to [place].  ?

## 2021-05-27 ENCOUNTER — Ambulatory Visit: Payer: No Typology Code available for payment source | Admitting: Adult Health

## 2021-06-01 ENCOUNTER — Other Ambulatory Visit (HOSPITAL_COMMUNITY)
Admission: RE | Admit: 2021-06-01 | Discharge: 2021-06-01 | Disposition: A | Discharge status: Home / Self Care | Payer: No Typology Code available for payment source | Source: Ambulatory Visit | Attending: Adult Health | Admitting: Adult Health

## 2021-06-01 ENCOUNTER — Ambulatory Visit (INDEPENDENT_AMBULATORY_CARE_PROVIDER_SITE_OTHER): Payer: No Typology Code available for payment source | Admitting: Adult Health

## 2021-06-01 ENCOUNTER — Encounter: Payer: Self-pay | Admitting: Adult Health

## 2021-06-01 VITALS — BP 128/82 | HR 82 | Ht 64.0 in | Wt 197.0 lb

## 2021-06-01 DIAGNOSIS — D229 Melanocytic nevi, unspecified: Secondary | ICD-10-CM

## 2021-06-01 NOTE — Progress Notes (Signed)
?  Subjective:  ?  ? Patient ID: Jackie Smith, female   DOB: [birth year], 36 y.o.   MRN: [id number] ? ?HPI ?Jackie Smith is a 36 year old white female, married, [id number] in for mole removal right buttock, she has noticed changes and it is irregular and lobular in shape. ?Lab Results  ?Component Value Date  ? DIAGPAP  04/28/2021  ?  - Negative for intraepithelial lesion or malignancy (NILM)  ? HPV NOT DETECTED 03/07/2018  ? [place] Negative 04/28/2021  ? PCP is [facility]. ? ?Review of Systems ?For mole removal ?Reviewed past medical,surgical, social and family history. Reviewed medications and allergies.  ?   ?Objective:  ? Physical Exam ?BP 128/82 (BP Location: Left Arm, Patient Position: Sitting, Cuff Size: Normal)   Pulse 82   Ht '5\' 4"'$  (1.626 m)   Wt 197 lb (89.4 kg)   LMP  (LMP Unknown)   BMI 33.81 kg/m?  Consent signed, time out performed. ?Skin warm and dry, injected around irregular lobular mole,on right buttock, with 2 % lidocaine 3 cc, waited til numb, mole picked up and excised with # 11 blade, Dr Nelda Marseille placed 2 prolene sutures, edges together and she was given pad. ? [facility] - 06/01/21 1601   ? ?  ? Pregnancy Intention Screening  ? Does the patient want to become pregnant in the next year? N/A   ? Does the patient's partner want to become pregnant in the next year? N/A   ? Would the patient like to discuss contraceptive options today? N/A   ?  ? Contraception Wrap Up  ? Current Method Female Sterilization   ? End Method Female Sterilization   ? Contraception Counseling Provided No   ? ?  ?  ? ?  ?  ?   ?Assessment:  ?   ?1. Change in mole ?Mole removed,sent to pathology ?  Keep clean and dry ?Can use tylenol and advil if any pain ?Plan:  ?   ?Return in 1 week for suture removal  ?   ?

## 2021-06-04 LAB — SURGICAL PATHOLOGY

## 2021-06-08 ENCOUNTER — Ambulatory Visit: Payer: No Typology Code available for payment source | Admitting: Adult Health

## 2021-07-27 DIAGNOSIS — G4700 Insomnia, unspecified: Secondary | ICD-10-CM | POA: Diagnosis not present

## 2021-07-27 DIAGNOSIS — F419 Anxiety disorder, unspecified: Secondary | ICD-10-CM | POA: Diagnosis not present

## 2021-07-27 DIAGNOSIS — R030 Elevated blood-pressure reading, without diagnosis of hypertension: Secondary | ICD-10-CM | POA: Diagnosis not present

## 2021-07-27 DIAGNOSIS — Z6833 Body mass index (BMI) 33.0-33.9, adult: Secondary | ICD-10-CM | POA: Diagnosis not present

## 2021-09-06 ENCOUNTER — Ambulatory Visit
Admission: EM | Admit: 2021-09-06 | Discharge: 2021-09-06 | Disposition: A | Discharge status: Home / Self Care | Payer: No Typology Code available for payment source | Attending: Family Medicine | Admitting: Family Medicine

## 2021-09-06 DIAGNOSIS — R509 Fever, unspecified: Secondary | ICD-10-CM | POA: Insufficient documentation

## 2021-09-06 DIAGNOSIS — J069 Acute upper respiratory infection, unspecified: Secondary | ICD-10-CM | POA: Diagnosis present

## 2021-09-06 DIAGNOSIS — R059 Cough, unspecified: Secondary | ICD-10-CM | POA: Diagnosis not present

## 2021-09-06 DIAGNOSIS — Z20822 Contact with and (suspected) exposure to covid-19: Secondary | ICD-10-CM | POA: Diagnosis not present

## 2021-09-06 LAB — RESP PANEL BY RT-PCR (FLU A&B, COVID) ARPGX2
Influenza A by PCR: NEGATIVE
Influenza B by PCR: NEGATIVE
SARS Coronavirus 2 by RT PCR: NEGATIVE

## 2021-09-06 NOTE — ED Provider Notes (Signed)
[facility]    CSN: [id number] Arrival date & time: 09/06/21  1752      History   Chief Complaint Chief Complaint  Patient presents with   Fever   Chills    HPI Alieyah Pizzino Barkdull is a 36 y.o. female.   Patient presenting today with 1 day history of chills, fever, body aches, fatigue, headache.  Denies cough, congestion, sore throat, chest pain, shortness of breath, abdominal pain, nausea vomiting or diarrhea.  So far taking ibuprofen with mild temporary relief of symptoms.  Husband now coming down with similar symptoms.  No known sick contacts recently.    Past Medical History:  Diagnosis Date   Acid reflux    Bipolar 1 disorder ([place])    Frequent UTI    Gastritis    History of benign breast tumor    Infections of kidney    Irritable bowel syndrome (IBS)    Thyroid disease    Type 2 diabetes mellitus ([place]) 05/02/2019   Vaginal Pap smear, abnormal     Patient Active Problem List   Diagnosis Date Noted   Change in mole 04/28/2021   Encounter for gynecological examination with Papanicolaou smear of cervix 04/28/2021   Severe sleep apnea 11/18/2020   Hypothyroidism (acquired) 09/29/2020   Loud snoring 09/29/2020   GERD with apnea 09/29/2020   Hypersomnia with sleep apnea 09/29/2020   Type 2 diabetes mellitus without complication, without long-term current use of insulin ([place]) 09/29/2020   Morbid obesity with body mass index (BMI) of 40.0 to 44.9 in adult [facility]) 09/29/2020   Hypothyroidism 05/01/2019   Morbid obesity ([place]) 05/01/2019   Mild intermittent asthma 05/01/2019   Vitamin D deficiency 05/01/2019   Major depression 05/15/2014    Past Surgical History:  Procedure Laterality Date   c sections     DILATION AND CURETTAGE OF UTERUS     INDUCED ABORTION     LASER ABLATION OF THE CERVIX     TUBAL LIGATION      OB History     Gravida  5   Para  4   Term  3   Preterm  1   AB  1   Living  3      SAB  0   IAB  1   Ectopic       Multiple      Live Births               Home Medications    Prior to Admission medications   Medication Sig Start Date End Date Taking? Authorizing Provider  cholecalciferol (VITAMIN D3) 25 MCG (1000 UNIT) tablet Take 1,000 Units by mouth daily.    [provider]  ferrous sulfate 324 MG TBEC Take 324 mg by mouth.    [provider]  fluticasone (FLONASE) 50 MCG/ACT nasal spray Place 1 spray into both nostrils daily for 14 days. 03/05/20 03/19/20  Emerson Monte, FNP  hydrOXYzine (VISTARIL) 25 MG capsule Take 1 before procedure in office, do not drive 4/0/97   Derrek Monaco A, NP  ibuprofen (ADVIL) 800 MG tablet Take 1 tablet (800 mg total) by mouth every 8 (eight) hours as needed. 05/10/21   Leath-Warren, Alda Lea, NP  magnesium 30 MG tablet Take 30 mg by mouth 2 (two) times daily.    [provider]  MOUNJARO 2.5 MG/0.5ML Pen Inject into the skin. 05/28/21   [provider]  traZODone (DESYREL) 50 MG tablet Take 100 mg by  mouth at bedtime as needed. 04/08/21   [provider]  vitamin B-12 (CYANOCOBALAMIN) 500 MCG tablet Take 500 mcg by mouth daily.    [provider]    Family History Family History  Problem Relation Age of Onset   Drug abuse Mother    Cancer Mother        cervix   Bipolar disorder Father    Drug abuse Father    Heart disease Other    Arthritis Other    Cancer Other    Asthma Other    Diabetes Other    Depression Sister    Anxiety disorder Sister    Bipolar disorder Paternal Uncle     Social History Social History   Tobacco Use   Smoking status: Former    Packs/day: 0.25    Types: Cigarettes    Quit date: 04/26/2014    Years since quitting: 7.3   Smokeless tobacco: Never  Vaping Use   Vaping Use: Never used  Substance Use Topics   Alcohol use: No   Drug use: No     Allergies   Imitrex [sumatriptan base]   Review of Systems Review of Systems Per HPI  Physical Exam Triage  Vital Signs ED Triage Vitals  Enc Vitals Group     BP 09/06/21 1758 129/78     Pulse Rate 09/06/21 1758 82     Resp 09/06/21 1758 16     Temp 09/06/21 1758 98.2 F (36.8 C)     Temp Source 09/06/21 1758 Oral     SpO2 09/06/21 1758 98 %     Weight --      Height --      Head Circumference --      Peak Flow --      Pain Score 09/06/21 1808 4     Pain Loc --      Pain Edu? --      Excl. in [place]? --    No data found.  Updated Vital Signs BP 129/78 (BP Location: Right Arm)   Pulse 82   Temp 98.2 F (36.8 C) (Oral)   Resp 16   SpO2 98%   Visual Acuity Right Eye Distance:   Left Eye Distance:   Bilateral Distance:    Right Eye Near:   Left Eye Near:    Bilateral Near:     Physical Exam Vitals and nursing note reviewed.  Constitutional:      Appearance: Normal appearance. She is not ill-appearing.  HENT:     Head: Atraumatic.     Right Ear: Tympanic membrane normal.     Left Ear: Tympanic membrane normal.     Nose: Nose normal.     Mouth/Throat:     Mouth: Mucous membranes are moist.     Pharynx: Oropharynx is clear.  Eyes:     Extraocular Movements: Extraocular movements intact.     Conjunctiva/sclera: Conjunctivae normal.  Cardiovascular:     Rate and Rhythm: Normal rate and regular rhythm.     Heart sounds: Normal heart sounds.  Pulmonary:     Effort: Pulmonary effort is normal.     Breath sounds: Normal breath sounds. No wheezing or rales.  Musculoskeletal:        General: Normal range of motion.     Cervical back: Normal range of motion and neck supple.  Skin:    General: Skin is warm and dry.  Neurological:     Mental Status: She is alert and oriented to  person, place, and time.  Psychiatric:        Mood and Affect: Mood normal.        Thought Content: Thought content normal.        Judgment: Judgment normal.    UC Treatments / Results  Labs (all labs ordered are listed, but only abnormal results are displayed) Labs Reviewed  RESP PANEL BY  RT-PCR (FLU A&B, COVID) ARPGX2    EKG   Radiology No results found.  Procedures Procedures (including critical care time)  Medications Ordered in UC Medications - No data to display  Initial Impression / Assessment and Plan / UC Course  I have reviewed the triage vital signs and the nursing notes.  Pertinent labs & imaging results that were available during my care of the patient were reviewed by me and considered in my medical decision making (see chart for details).     Suspect viral infection, COVID and flu testing pending, treat with supportive over-the-counter medications and home care.  Return for any worsening symptoms.  Final Clinical Impressions(s) / UC Diagnoses   Final diagnoses:  Viral URI with cough  Fever, unspecified   Discharge Instructions   None    ED Prescriptions   None    PDMP not reviewed this encounter.   Volney American, [place] 09/06/21 [phone]

## 2021-09-06 NOTE — ED Triage Notes (Signed)
Pt presents with c/o chills and feeling unwell since yesterday

## 2021-12-23 ENCOUNTER — Ambulatory Visit
Admission: EM | Admit: 2021-12-23 | Discharge: 2021-12-23 | Disposition: A | Discharge status: Home / Self Care | Payer: No Typology Code available for payment source | Attending: Nurse Practitioner | Admitting: Nurse Practitioner

## 2021-12-23 ENCOUNTER — Encounter: Payer: Self-pay | Admitting: Emergency Medicine

## 2021-12-23 DIAGNOSIS — Z1152 Encounter for screening for COVID-19: Secondary | ICD-10-CM | POA: Diagnosis present

## 2021-12-23 DIAGNOSIS — J069 Acute upper respiratory infection, unspecified: Secondary | ICD-10-CM | POA: Insufficient documentation

## 2021-12-23 LAB — RESP PANEL BY RT-PCR (FLU A&B, COVID) ARPGX2
Influenza A by PCR: NEGATIVE
Influenza B by PCR: NEGATIVE
SARS Coronavirus 2 by RT PCR: NEGATIVE

## 2021-12-23 MED ORDER — PROMETHAZINE-DM 6.25-15 MG/5ML PO SYRP: 5.0000 mL | ORAL_SOLUTION | Freq: Every evening | ORAL | 0 refills | Status: DC | PRN

## 2021-12-23 MED ORDER — BENZONATATE 100 MG PO CAPS: 100.0000 mg | ORAL_CAPSULE | Freq: Three times a day (TID) | ORAL | 0 refills | Status: DC | PRN

## 2021-12-23 NOTE — ED Provider Notes (Signed)
[facility]    CSN: [id number] Arrival date & time: 12/23/21  1429      History   Chief Complaint No chief complaint on file.   HPI Jackie Smith is a 36 y.o. female.   Patient presents for 4 days of chills, dry cough, shortness of breath with talking, nasal congestion, sore throat, sinus pressure and sinus dryness, loss of taste, fatigue, and nausea when she woke up this morning that is now better.  She denies fever body aches, wheezing, chest pain or tightness, chest congestion, runny nose, headache, ear pain, abdominal pain, vomiting, diarrhea, and decreased appetite.  She has been taking vitamins to "boost" her immune system and water.  Patient denies history of chronic lung disease.  Has not tested for COVID-19 since symptoms started.  Reports her entire family has been sick, she is the last to get the sickness.    Past Medical History:  Diagnosis Date   Acid reflux    Bipolar 1 disorder ([place])    Frequent UTI    Gastritis    History of benign breast tumor    Infections of kidney    Irritable bowel syndrome (IBS)    Thyroid disease    Type 2 diabetes mellitus ([place]) 05/02/2019   Vaginal Pap smear, abnormal     Patient Active Problem List   Diagnosis Date Noted   Change in mole 04/28/2021   Encounter for gynecological examination with Papanicolaou smear of cervix 04/28/2021   Severe sleep apnea 11/18/2020   Hypothyroidism (acquired) 09/29/2020   Loud snoring 09/29/2020   GERD with apnea 09/29/2020   Hypersomnia with sleep apnea 09/29/2020   Type 2 diabetes mellitus without complication, without long-term current use of insulin ([place]) 09/29/2020   Morbid obesity with body mass index (BMI) of 40.0 to 44.9 in adult [facility]) 09/29/2020   Hypothyroidism 05/01/2019   Morbid obesity ([place]) 05/01/2019   Mild intermittent asthma 05/01/2019   Vitamin D deficiency 05/01/2019   Major depression 05/15/2014    Past Surgical History:  Procedure Laterality  Date   c sections     DILATION AND CURETTAGE OF UTERUS     INDUCED ABORTION     LASER ABLATION OF THE CERVIX     TUBAL LIGATION      OB History     Gravida  5   Para  4   Term  3   Preterm  1   AB  1   Living  3      SAB  0   IAB  1   Ectopic      Multiple      Live Births               Home Medications    Prior to Admission medications   Medication Sig Start Date End Date Taking? Authorizing Provider  benzonatate (TESSALON) 100 MG capsule Take 1 capsule (100 mg total) by mouth 3 (three) times daily as needed for cough. Do not take with alcohol or while driving or operating heavy machinery.  May cause drowsiness. 12/23/21  Yes Eulogio Bear, NP  promethazine-dextromethorphan (PROMETHAZINE-DM) 6.25-15 MG/5ML syrup Take 5 mLs by mouth at bedtime as needed for cough. Do not take with alcohol or while driving or operating heavy machinery.  May cause drowsiness. 12/23/21  Yes Eulogio Bear, NP  cholecalciferol (VITAMIN D3) 25 MCG (1000 UNIT) tablet Take 1,000 Units by mouth daily.    [provider]  ferrous sulfate 324  MG TBEC Take 324 mg by mouth.    [provider]  fluticasone (FLONASE) 50 MCG/ACT nasal spray Place 1 spray into both nostrils daily for 14 days. 03/05/20 03/19/20  Emerson Monte, FNP  hydrOXYzine (VISTARIL) 25 MG capsule Take 1 before procedure in office, do not drive 04/23/94   Derrek Monaco A, NP  ibuprofen (ADVIL) 800 MG tablet Take 1 tablet (800 mg total) by mouth every 8 (eight) hours as needed. 05/10/21   Leath-Warren, Alda Lea, NP  magnesium 30 MG tablet Take 30 mg by mouth 2 (two) times daily.    [provider]  MOUNJARO 2.5 MG/0.5ML Pen Inject into the skin. 05/28/21   [provider]  traZODone (DESYREL) 50 MG tablet Take 100 mg by mouth at bedtime as needed. 04/08/21   [provider]  vitamin B-12 (CYANOCOBALAMIN) 500 MCG tablet Take 500 mcg by mouth daily.    [provider]    Family History Family History  Problem Relation Age of Onset   Drug abuse Mother    Cancer Mother        cervix   Bipolar disorder Father    Drug abuse Father    Heart disease Other    Arthritis Other    Cancer Other    Asthma Other    Diabetes Other    Depression Sister    Anxiety disorder Sister    Bipolar disorder Paternal Uncle     Social History Social History   Tobacco Use   Smoking status: Former    Packs/day: 0.25    Types: Cigarettes    Quit date: 04/26/2014    Years since quitting: 7.6   Smokeless tobacco: Never  Vaping Use   Vaping Use: Never used  Substance Use Topics   Alcohol use: No   Drug use: No     Allergies   Imitrex [sumatriptan base]   Review of Systems Review of Systems Per HPI  Physical Exam Triage Vital Signs ED Triage Vitals  Enc Vitals Group     BP 12/23/21 1439 113/74     Pulse Rate 12/23/21 1439 73     Resp 12/23/21 1439 18     Temp 12/23/21 1439 98 F (36.7 C)     Temp Source 12/23/21 1439 Oral     SpO2 12/23/21 1439 98 %     Weight --      Height --      Head Circumference --      Peak Flow --      Pain Score 12/23/21 1441 5     Pain Loc --      Pain Edu? --      Excl. in [place]? --    No data found.  Updated Vital Signs BP 113/74 (BP Location: Right Arm)   Pulse 73   Temp 98 F (36.7 C) (Oral)   Resp 18   SpO2 98%   Visual Acuity Right Eye Distance:   Left Eye Distance:   Bilateral Distance:    Right Eye Near:   Left Eye Near:    Bilateral Near:     Physical Exam Vitals and nursing note reviewed.  Constitutional:      General: She is not in acute distress.    Appearance: Normal appearance. She is not ill-appearing or toxic-appearing.  HENT:     Head: Normocephalic and atraumatic.     Right Ear: Tympanic membrane, ear canal and external ear normal.     Left  Ear: Tympanic membrane, ear canal and external ear normal.     Nose: Congestion and rhinorrhea present.     Comments:  Turbinate swollen and boggy    Mouth/Throat:     Mouth: Mucous membranes are moist.     Pharynx: Oropharynx is clear. No oropharyngeal exudate or posterior oropharyngeal erythema.  Eyes:     General: No scleral icterus.    Extraocular Movements: Extraocular movements intact.  Cardiovascular:     Rate and Rhythm: Normal rate and regular rhythm.  Pulmonary:     Effort: Pulmonary effort is normal. No respiratory distress.     Breath sounds: Normal breath sounds. No wheezing, rhonchi or rales.  Abdominal:     General: Abdomen is flat. Bowel sounds are normal. There is no distension.     Palpations: Abdomen is soft.     Tenderness: There is no abdominal tenderness. There is no guarding.  Musculoskeletal:     Cervical back: Normal range of motion and neck supple.  Lymphadenopathy:     Cervical: No cervical adenopathy.  Skin:    General: Skin is warm and dry.     Coloration: Skin is not jaundiced or pale.     Findings: No erythema or rash.  Neurological:     Mental Status: She is alert and oriented to person, place, and time.  Psychiatric:        Behavior: Behavior is cooperative.      UC Treatments / Results  Labs (all labs ordered are listed, but only abnormal results are displayed) Labs Reviewed  RESP PANEL BY RT-PCR (FLU A&B, COVID) ARPGX2    EKG   Radiology No results found.  Procedures Procedures (including critical care time)  Medications Ordered in UC Medications - No data to display  Initial Impression / Assessment and Plan / UC Course  I have reviewed the triage vital signs and the nursing notes.  Pertinent labs & imaging results that were available during my care of the patient were reviewed by me and considered in my medical decision making (see chart for details).   Patient is well-appearing, normotensive, afebrile, not tachycardic, not tachypneic, oxygenating well on room air.    Viral URI with cough Encounter for screening for COVID-19 Suspect viral  etiology COVID-19 and influenza testing obtained Supportive care discussed with patient-start cough suppressant ER and return precautions discussed Note given for work  The patient was given the opportunity to ask questions.  All questions answered to their satisfaction.  The patient is in agreement to this plan.    Final Clinical Impressions(s) / UC Diagnoses   Final diagnoses:  Viral URI with cough  Encounter for screening for COVID-19     Discharge Instructions      You have a viral upper respiratory infection.  Symptoms should improve over the next week to 10 days.  If you develop chest pain or shortness of breath, go to the emergency room.  We have tested you today for COVID-19 and influenza.  You will see the results in Mychart and we will call you with positive results.    Please stay home and isolate until you are aware of the results.    Some things that can make you feel better are: - Increased rest - Increasing fluid with water/sugar free electrolytes - Acetaminophen and ibuprofen as needed for fever/pain - Salt water gargling, chloraseptic spray and throat lozenges - OTC guaifenesin (Mucinex) 600 mg twice daily - Saline sinus flushes or a neti pot -  Humidifying the air -Tessalon Perles during the day as needed for dry cough and cough syrup at nighttime as needed for dry cough     ED Prescriptions     Medication Sig Dispense Auth. Provider   benzonatate (TESSALON) 100 MG capsule Take 1 capsule (100 mg total) by mouth 3 (three) times daily as needed for cough. Do not take with alcohol or while driving or operating heavy machinery.  May cause drowsiness. 21 capsule Noemi Chapel A, NP   promethazine-dextromethorphan (PROMETHAZINE-DM) 6.25-15 MG/5ML syrup Take 5 mLs by mouth at bedtime as needed for cough. Do not take with alcohol or while driving or operating heavy machinery.  May cause drowsiness. 118 mL Eulogio Bear, NP      PDMP not reviewed this  encounter.   Eulogio Bear, NP 12/23/21 [phone]

## 2021-12-23 NOTE — ED Triage Notes (Signed)
Dry sinus, unable to taste food, chills, sore throat that comes and goes x 4 days.  States she feels fatigued

## 2021-12-23 NOTE — Discharge Instructions (Signed)
You have a viral upper respiratory infection.  Symptoms should improve over the next week to 10 days.  If you develop chest pain or shortness of breath, go to the emergency room.  We have tested you today for COVID-19 and influenza.  You will see the results in Mychart and we will call you with positive results.    Please stay home and isolate until you are aware of the results.    Some things that can make you feel better are: - Increased rest - Increasing fluid with water/sugar free electrolytes - Acetaminophen and ibuprofen as needed for fever/pain - Salt water gargling, chloraseptic spray and throat lozenges - OTC guaifenesin (Mucinex) 600 mg twice daily - Saline sinus flushes or a neti pot - Humidifying the air -Tessalon Perles during the day as needed for dry cough and cough syrup at nighttime as needed for dry cough

## 2022-02-24 NOTE — Progress Notes (Signed)
CARDIOLOGY CONSULT NOTE       Patient ID: Jackie Smith MRN: [id number] DOB/AGE: 39-Jan-1987 39 y.o.  Admit date: (Not on file) Referring Physician: [facility] Primary Physician: Denny Levy, [place] Primary Cardiologist: New Reason for Consultation: Palpitations Pre syncope  Active Problems:   * No active hospital problems. *   HPI:  37 y.o. referred from [place] for palpitations and pre syncope. History of migraines , DM thyroid dx.  Seen in their ER 01/16/22 Was holding grandson and got in car felt like she was going to pass out Heart pounding ? 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 Has 3 older kids and one grandson Husband is welder Has had panic attacks in past   ROS All other systems reviewed and negative except as noted above  Past Medical History:  Diagnosis Date   Acid reflux    Bipolar 1 disorder (HCC)    Frequent UTI    Gastritis    History of benign breast tumor    Infections of kidney    Irritable bowel syndrome (IBS)    Thyroid disease    Type 2 diabetes mellitus ([place]) 05/02/2019   Vaginal Pap smear, abnormal     Family History  Problem Relation Age of Onset   Drug abuse Mother    Cancer Mother        cervix   Bipolar disorder Father    Drug abuse Father    Heart disease Other    Arthritis Other    Cancer Other    Asthma Other    Diabetes Other    Depression Sister    Anxiety disorder Sister    Bipolar disorder Paternal Uncle     Social History   Socioeconomic  History   Marital status: Married    Spouse name: Not on file   Number of children: Not on file   Years of education: college   Highest education level: Not on file  Occupational History   Occupation: call center office    Employer: [employer]  Tobacco Use   Smoking status: Former    Packs/day: 0.25    Types: Cigarettes    Quit date: 04/26/2014    Years since quitting: 7.8   Smokeless tobacco: Never  Vaping Use   Vaping Use: Never used  Substance and Sexual Activity   Alcohol use: No   Drug use: No   Sexual activity: Yes    Birth control/protection: Surgical    Comment: tubal   Other Topics Concern   Not on file  Social History Narrative   Not on file   Social Determinants of Health   Financial Resource Strain: Medium Risk (04/28/2021)   Overall Financial Resource Strain (CARDIA)    Difficulty of Paying Living Expenses: Somewhat hard  Food Insecurity: Food Insecurity Present (04/28/2021)   Hunger Vital Sign    Worried About Running Out of Food in the Last Year: Never true    Ran Out  of Food in the Last Year: Sometimes true  Transportation Needs: No Transportation Needs (04/28/2021)   PRAPARE - Hydrologist (Medical): No    Lack of Transportation (Non-Medical): No  Physical Activity: Insufficiently Active (04/28/2021)   Exercise Vital Sign    Days of Exercise per Week: 2 days    Minutes of Exercise per Session: 40 min  Stress: No Stress Concern Present (04/28/2021)   [place]    Feeling of Stress : Only a little  Social Connections: Moderately Integrated (04/28/2021)   Social Connection and Isolation Panel [NHANES]    Frequency of Communication with Friends and Family: More than three times a week    Frequency of Social Gatherings with Friends and Family: More than three times a week    Attends Religious Services: 1 to 4 times per year    Active Member of [employer] or  Organizations: No    Attends Archivist Meetings: Never    Marital Status: Married  Human resources officer Violence: Not At Risk (04/28/2021)   Humiliation, Afraid, Rape, and Kick questionnaire    Fear of Current or Ex-Partner: No    Emotionally Abused: No    Physically Abused: No    Sexually Abused: No    Past Surgical History:  Procedure Laterality Date   c sections     DILATION AND CURETTAGE OF UTERUS     INDUCED ABORTION     LASER ABLATION OF THE CERVIX     TUBAL LIGATION        Current Outpatient Medications:    cholecalciferol (VITAMIN D3) 25 MCG (1000 UNIT) tablet, Take 1,000 Units by mouth daily., Disp: , Rfl:    ibuprofen (ADVIL) 800 MG tablet, Take 1 tablet (800 mg total) by mouth every 8 (eight) hours as needed., Disp: 30 tablet, Rfl: 0   magnesium 30 MG tablet, Take 30 mg by mouth 2 (two) times daily., Disp: , Rfl:    MOUNJARO 2.5 MG/0.5ML Pen, Inject into the skin., Disp: , Rfl:    vitamin B-12 (CYANOCOBALAMIN) 500 MCG tablet, Take 500 mcg by mouth daily., Disp: , Rfl:    benzonatate (TESSALON) 100 MG capsule, Take 1 capsule (100 mg total) by mouth 3 (three) times daily as needed for cough. Do not take with alcohol or while driving or operating heavy machinery.  May cause drowsiness. (Patient not taking: Reported on 03/04/2022), Disp: 21 capsule, Rfl: 0   ferrous sulfate 324 MG TBEC, Take 324 mg by mouth. (Patient not taking: Reported on 03/04/2022), Disp: , Rfl:    fluticasone (FLONASE) 50 MCG/ACT nasal spray, Place 1 spray into both nostrils daily for 14 days., Disp: 16 g, Rfl: 0   hydrOXYzine (VISTARIL) 25 MG capsule, Take 1 before procedure in office, do not drive (Patient not taking: Reported on 03/04/2022), Disp: 10 capsule, Rfl: 0   promethazine-dextromethorphan (PROMETHAZINE-DM) 6.25-15 MG/5ML syrup, Take 5 mLs by mouth at bedtime as needed for cough. Do not take with alcohol or while driving or operating heavy machinery.  May cause drowsiness. (Patient not taking:  Reported on 03/04/2022), Disp: 118 mL, Rfl: 0   traZODone (DESYREL) 50 MG tablet, Take 100 mg by mouth at bedtime as needed. (Patient not taking: Reported on 03/04/2022), Disp: , Rfl:     Physical Exam: There were no vitals taken for this visit.   Affect appropriate Healthy:  appears stated age 40: normal Neck supple with no adenopathy JVP normal  no bruits no thyromegaly Lungs clear with no wheezing and good diaphragmatic motion Heart:  S1/S2 no murmur, no rub, gallop or click PMI normal Abdomen: benighn, BS positve, no tenderness, no AAA no bruit.  No HSM or HJR Distal pulses intact with no bruits No edema Neuro non-focal Skin warm and dry No muscular weakness   Labs:   Lab Results  Component Value Date   WBC 3.6 (L) 02/01/2021   HGB 12.6 02/01/2021   HCT 35.9 (L) 02/01/2021   MCV 90.0 02/01/2021   PLT 149 (L) 02/01/2021   No results for input(s): "NA", "K", "CL", "CO2", "BUN", "CREATININE", "CALCIUM", "PROT", "BILITOT", "ALKPHOS", "ALT", "AST", "GLUCOSE" in the last 168 hours.  Invalid input(s): "LABALBU" Lab Results  Component Value Date   TROPONINI <0.03 06/26/2018    Lab Results  Component Value Date   CHOL 216 (H) 05/01/2019   Lab Results  Component Value Date   HDL 46 (L) 05/01/2019   Lab Results  Component Value Date   LDLCALC 143 (H) 05/01/2019   Lab Results  Component Value Date   TRIG 143 05/01/2019   Lab Results  Component Value Date   CHOLHDL 4.7 05/01/2019   No results found for: "LDLDIRECT"    Radiology: No results found.  EKG: SR rate 80 normal low voltage from body habitus    ASSESSMENT AND PLAN:   Palpitations/Dizziness;  no obvious cardiac etiology ECG is low risk exam low risk  14 day holder , TTE and ETT ordered to r/o structural heart issues  DM:  ? No recent A1c not on meds will update Obesity :  on mounjaro f/u primary Thyroid :  TSH normal  Migraines:  f/U primary with dizziness will order non constrast head CT    TTE ETT 14 day Zio   F/U cardiology PRN   Signed: Jenkins Rouge 03/04/2022, 2:39 PM

## 2022-03-04 ENCOUNTER — Encounter: Payer: Self-pay | Admitting: Cardiovascular Disease

## 2022-03-04 ENCOUNTER — Ambulatory Visit: Payer: No Typology Code available for payment source | Attending: Cardiovascular Disease

## 2022-03-04 ENCOUNTER — Ambulatory Visit
Payer: No Typology Code available for payment source | Attending: Cardiovascular Disease | Admitting: Cardiovascular Disease

## 2022-03-04 VITALS — BP 102/78 | HR 90 | Ht 64.0 in | Wt 236.8 lb

## 2022-03-04 DIAGNOSIS — R002 Palpitations: Secondary | ICD-10-CM | POA: Diagnosis not present

## 2022-03-04 DIAGNOSIS — R55 Syncope and collapse: Secondary | ICD-10-CM | POA: Diagnosis not present

## 2022-03-04 DIAGNOSIS — E663 Overweight: Secondary | ICD-10-CM

## 2022-03-04 NOTE — Patient Instructions (Signed)
Medication Instructions:  Your physician recommends that you continue on your current medications as directed. Please refer to the Current Medication list given to you today.  *If you need a refill on your cardiac medications before your next appointment, please call your pharmacy*   Lab Work: NONE   If you have labs (blood work) drawn today and your tests are completely normal, you will receive your results only by: [place] (if you have MyChart) OR A paper copy in the mail If you have any lab test that is abnormal or we need to change your treatment, we will call you to review the results.   Testing/Procedures: Your physician has requested that you have an exercise tolerance test. For further information please visit [email or web address]. Please also follow instruction sheet, as given.  Your physician has requested that you have an echocardiogram. Echocardiography is a painless test that uses sound waves to create images of your heart. It provides your doctor with information about the size and shape of your heart and how well your heart's chambers and valves are working. This procedure takes approximately one hour. There are no restrictions for this procedure. Please do NOT wear cologne, perfume, aftershave, or lotions (deodorant is allowed). Please arrive 15 minutes prior to your appointment time.  ZIO XT- Long Term Monitor Instructions   Your physician has requested you wear your ZIO patch monitor___14___days. May remove on March 18, 2022   This is a single patch monitor.  [employer] supplies one patch monitor per enrollment.  Additional stickers are not available.   Please do not apply patch if you will be having a Nuclear Stress Test, Echocardiogram, Cardiac CT, MRI, or Chest Xray during the time frame you would be wearing the monitor. The patch cannot be worn during these tests.  You cannot remove and re-apply the ZIO XT patch monitor.   Your ZIO patch monitor will be sent  USPS Priority mail from [facility] directly to your home address. The monitor may also be mailed to a PO BOX if home delivery is not available.   It may take 3-5 days to receive your monitor after you have been enrolled.   Once you have received you monitor, please review enclosed instructions.  Your monitor has already been registered assigning a specific monitor serial # to you.   Applying the monitor   Shave hair from upper left chest.   Hold abrader disc by orange tab.  Rub abrader in 40 strokes over left upper chest as indicated in your monitor instructions.   Clean area with 4 enclosed alcohol pads .  Use all pads to assure are is cleaned thoroughly.  Let dry.   Apply patch as indicated in monitor instructions.  Patch will be place under collarbone on left side of chest with arrow pointing upward.   Rub patch adhesive wings for 2 minutes.Remove white label marked "1".  Remove white label marked "2".  Rub patch adhesive wings for 2 additional minutes.   While looking in a mirror, press and release button in center of patch.  A small green light will flash 3-4 times .  This will be your only indicator the monitor has been turned on.     Do not shower for the first 24 hours.  You may shower after the first 24 hours.   Press button if you feel a symptom. You will hear a small click.  Record Date, Time and Symptom in the Patient Log Book.   When you  are ready to remove patch, follow instructions on last 2 pages of Patient Log Book.  Stick patch monitor onto last page of Patient Log Book.   Place Patient Log Book in [place] box.  Use locking tab on box and tape box closed securely.  The [employer] and [employer] has [employer] on it.  Please place in mailbox as soon as possible.  Your physician should have your test results approximately 7 days after the monitor has been mailed back to [facility].   Call [place] at [phone] if you have questions  regarding your ZIO XT patch monitor.  Call them immediately if you see an orange light blinking on your monitor.   If your monitor falls off in less than 4 days contact our Monitor department at [phone].  If your monitor becomes loose or falls off after 4 days call [employer] at [phone] for suggestions on securing your monitor.     Follow-Up: At [facility], you and your health needs are our priority.  As part of our continuing mission to provide you with exceptional heart care, we have created designated Provider Care Teams.  These Care Teams include your primary Cardiologist (physician) and Advanced Practice Providers (APPs -  Physician Assistants and Nurse Practitioners) who all work together to provide you with the care you need, when you need it.  We recommend signing up for the patient portal called "MyChart".  Sign up information is provided on this After Visit Summary.  MyChart is used to connect with patients for Virtual Visits (Telemedicine).  Patients are able to view lab/test results, encounter notes, upcoming appointments, etc.  Non-urgent messages can be sent to your provider as well.   To learn more about what you can do with MyChart, go to [email or web address].    Your next appointment:    As Needed   Provider:   Jenkins Rouge, MD    Other Instructions Thank you for choosing [facility]!

## 2022-03-30 ENCOUNTER — Telehealth: Payer: Self-pay

## 2022-03-30 NOTE — Telephone Encounter (Signed)
-----   Message from Michaelyn Barter, RN sent at 03/30/2022 12:47 PM EDT -----  ----- Message ----- From: Josue Hector, MD Sent: 03/30/2022   9:51 AM EDT To: Michaelyn Barter, RN  No significant arrhythmias

## 2022-03-30 NOTE — Telephone Encounter (Signed)
Patient notified and verbalized understanding. Patient had no questions or concerns at this time. PCP copied 

## 2022-04-08 ENCOUNTER — Ambulatory Visit (HOSPITAL_COMMUNITY): Payer: No Typology Code available for payment source | Attending: Cardiovascular Disease

## 2022-04-08 ENCOUNTER — Ambulatory Visit (HOSPITAL_COMMUNITY): Payer: No Typology Code available for payment source

## 2022-05-06 ENCOUNTER — Ambulatory Visit (HOSPITAL_COMMUNITY): Admission: RE | Admit: 2022-05-06 | Payer: No Typology Code available for payment source | Source: Ambulatory Visit

## 2022-06-10 ENCOUNTER — Ambulatory Visit (HOSPITAL_COMMUNITY): Admission: RE | Admit: 2022-06-10 | Payer: No Typology Code available for payment source | Source: Ambulatory Visit

## 2022-08-19 ENCOUNTER — Ambulatory Visit (HOSPITAL_COMMUNITY): Payer: No Typology Code available for payment source | Attending: Cardiovascular Disease

## 2022-08-23 ENCOUNTER — Encounter (HOSPITAL_COMMUNITY): Payer: Self-pay

## 2022-08-23 ENCOUNTER — Emergency Department (HOSPITAL_COMMUNITY)
Admission: EM | Admit: 2022-08-23 | Discharge: 2022-08-23 | Disposition: A | Discharge status: Home / Self Care | Payer: No Typology Code available for payment source | Attending: Student | Admitting: Student

## 2022-08-23 ENCOUNTER — Emergency Department (HOSPITAL_COMMUNITY): Payer: No Typology Code available for payment source

## 2022-08-23 ENCOUNTER — Other Ambulatory Visit: Payer: Self-pay

## 2022-08-23 DIAGNOSIS — J45909 Unspecified asthma, uncomplicated: Secondary | ICD-10-CM | POA: Diagnosis not present

## 2022-08-23 DIAGNOSIS — E039 Hypothyroidism, unspecified: Secondary | ICD-10-CM | POA: Insufficient documentation

## 2022-08-23 DIAGNOSIS — R0600 Dyspnea, unspecified: Secondary | ICD-10-CM | POA: Insufficient documentation

## 2022-08-23 DIAGNOSIS — E119 Type 2 diabetes mellitus without complications: Secondary | ICD-10-CM | POA: Insufficient documentation

## 2022-08-23 DIAGNOSIS — M791 Myalgia, unspecified site: Secondary | ICD-10-CM | POA: Diagnosis present

## 2022-08-23 DIAGNOSIS — Z1152 Encounter for screening for COVID-19: Secondary | ICD-10-CM | POA: Diagnosis not present

## 2022-08-23 LAB — CBC WITH DIFFERENTIAL/PLATELET
Abs Immature Granulocytes: 0.01 10*3/uL (ref 0.00–0.07)
Basophils Absolute: 0 10*3/uL (ref 0.0–0.1)
Basophils Relative: 0 %
Eosinophils Absolute: 0.1 10*3/uL (ref 0.0–0.5)
Eosinophils Relative: 1 %
HCT: 43.7 % (ref 36.0–46.0)
Hemoglobin: 15.1 g/dL — ABNORMAL HIGH (ref 12.0–15.0)
Immature Granulocytes: 0 %
Lymphocytes Relative: 9 %
Lymphs Abs: 0.7 10*3/uL (ref 0.7–4.0)
MCH: 31.5 pg (ref 26.0–34.0)
MCHC: 34.6 g/dL (ref 30.0–36.0)
MCV: 91 fL (ref 80.0–100.0)
Monocytes Absolute: 0.3 10*3/uL (ref 0.1–1.0)
Monocytes Relative: 5 %
Neutro Abs: 6.1 10*3/uL (ref 1.7–7.7)
Neutrophils Relative %: 85 %
Platelets: 176 10*3/uL (ref 150–400)
RBC: 4.8 MIL/uL (ref 3.87–5.11)
RDW: 13.5 % (ref 11.5–15.5)
WBC: 7.2 10*3/uL (ref 4.0–10.5)
nRBC: 0 % (ref 0.0–0.2)

## 2022-08-23 LAB — COMPREHENSIVE METABOLIC PANEL
ALT: 27 U/L (ref 0–44)
AST: 19 U/L (ref 15–41)
Albumin: 3.3 g/dL — ABNORMAL LOW (ref 3.5–5.0)
Alkaline Phosphatase: 48 U/L (ref 38–126)
Anion gap: 8 (ref 5–15)
BUN: 10 mg/dL (ref 6–20)
CO2: 22 mmol/L (ref 22–32)
Calcium: 8.7 mg/dL — ABNORMAL LOW (ref 8.9–10.3)
Chloride: 104 mmol/L (ref 98–111)
Creatinine, Ser: 0.82 mg/dL (ref 0.44–1.00)
GFR, Estimated: >60 mL/min (ref >60)
Glucose, Bld: 83 mg/dL (ref 70–99)
Potassium: 4.2 mmol/L (ref 3.5–5.1)
Sodium: 134 mmol/L — ABNORMAL LOW (ref 135–145)
Total Bilirubin: 0.5 mg/dL (ref 0.3–1.2)
Total Protein: 6 g/dL — ABNORMAL LOW (ref 6.5–8.1)

## 2022-08-23 LAB — SARS CORONAVIRUS 2 BY RT PCR: SARS Coronavirus 2 by RT PCR: NEGATIVE

## 2022-08-23 LAB — TROPONIN I (HIGH SENSITIVITY): Troponin I (High Sensitivity): <2 ng/L (ref <18)

## 2022-08-23 MED ORDER — LACTATED RINGERS IV BOLUS
1000.0000 mL | Freq: Once | INTRAVENOUS | Status: AC
  Administered 2022-08-23: 1000 mL via INTRAVENOUS

## 2022-08-23 MED ORDER — NAPROXEN 250 MG PO TABS
500.0000 mg | ORAL_TABLET | Freq: Once | ORAL | Status: AC
  Administered 2022-08-23: 500 mg via ORAL
  Filled 2022-08-23: qty 2

## 2022-08-23 NOTE — ED Notes (Signed)
Pt off to xray

## 2022-08-23 NOTE — ED Notes (Signed)
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  Pt complains of SOB and numbness in hands Pt stated she also has lumbar pain  Denies CP Denies cough cold or fever.

## 2022-08-23 NOTE — ED Triage Notes (Signed)
Pt presents with body aches, chills, SOB, since last night, works in ED.

## 2022-08-23 NOTE — ED Notes (Signed)
Additional blood collected off IV for lab Approx 150cc left on NS bolus

## 2022-08-23 NOTE — ED Provider Notes (Signed)
[facility] AT [facility] Provider Note  CSN: [id number] Arrival date & time: 08/23/22 1109  Chief Complaint(s) Flu-Like Symptoms  HPI Jackie Smith is a 37 y.o. female with PMH bipolar 1, IBS, T2DM who presents emergency department for evaluation of myalgias, chills, chest pain, shortness of breath.  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  Jackie Smith states that Jackie Smith has had intermittent shortness of breath over the last few days with some mild chest pain.  Jackie Smith states that Jackie Smith is on Ozempic and has had a sensation of lightheadedness and is worried Jackie Smith may be dehydrated.  Denies abdominal pain, nausea, vomiting or other systemic symptoms.   Past Medical History Past Medical History:  Diagnosis Date   Acid reflux    Bipolar 1 disorder (HCC)    Frequent UTI    Gastritis    History of benign breast tumor    Infections of kidney    Irritable bowel syndrome (IBS)    Thyroid disease    Type 2 diabetes mellitus (HCC) 05/02/2019   Vaginal Pap smear, abnormal    Patient Active Problem List   Diagnosis Date Noted   Change in mole 04/28/2021   Encounter for gynecological examination with Papanicolaou smear of cervix 04/28/2021   Severe sleep apnea 11/18/2020   Hypothyroidism (acquired) 09/29/2020   Loud snoring 09/29/2020   GERD with apnea 09/29/2020   Hypersomnia with sleep apnea 09/29/2020   Type 2 diabetes mellitus without complication, without long-term current use of insulin (HCC) 09/29/2020   Morbid obesity with body mass index (BMI) of 40.0 to 44.9 in adult [facility]) 09/29/2020   Hypothyroidism 05/01/2019   Morbid obesity (HCC) 05/01/2019   Mild intermittent asthma 05/01/2019   Vitamin D deficiency 05/01/2019   Major depression 05/15/2014   Home Medication(s) Prior to Admission medications   Medication Sig Start Date End Date Taking? Authorizing Provider  benzonatate  (TESSALON) 100 MG capsule Take 1 capsule (100 mg total) by mouth 3 (three) times daily as needed for cough. Do not take with alcohol or while driving or operating heavy machinery.  May cause drowsiness. Patient not taking: Reported on 03/04/2022 12/23/21   Valentino Nose, NP  cholecalciferol (VITAMIN D3) 25 MCG (1000 UNIT) tablet Take 1,000 Units by mouth daily.    [provider]  ferrous sulfate 324 MG TBEC Take 324 mg by mouth. Patient not taking: Reported on 03/04/2022    [provider]  fluticasone (FLONASE) 50 MCG/ACT nasal spray Place 1 spray into both nostrils daily for 14 days. 03/05/20 03/19/20  Avegno, Zachery Dakins, FNP  hydrOXYzine (VISTARIL) 25 MG capsule Take 1 before procedure in office, do not drive Patient not taking: Reported on 03/04/2022 05/24/21   Adline Potter, NP  ibuprofen (ADVIL) 800 MG tablet Take 1 tablet (800 mg total) by mouth every 8 (eight) hours as needed. 05/10/21   Leath-Warren, Sadie Haber, NP  magnesium 30 MG tablet Take 30 mg by mouth 2 (two) times daily.    [provider]  MOUNJARO 2.5 MG/0.5ML Pen Inject into the skin. 05/28/21   [provider]  promethazine-dextromethorphan (PROMETHAZINE-DM) 6.25-15 MG/5ML syrup Take 5 mLs by mouth at bedtime as needed for cough. Do not take with alcohol or while driving or operating heavy machinery.  May cause drowsiness. Patient not taking: Reported on 03/04/2022 12/23/21   Valentino Nose, NP  traZODone (DESYREL) 50 MG tablet Take 100 mg by  mouth at bedtime as needed. Patient not taking: Reported on 03/04/2022 04/08/21   [provider]  vitamin B-12 (CYANOCOBALAMIN) 500 MCG tablet Take 500 mcg by mouth daily.    [provider]                                                                                                                                    Past Surgical History Past Surgical History:  Procedure Laterality Date   c sections     DILATION AND CURETTAGE  OF UTERUS     INDUCED ABORTION     LASER ABLATION OF THE CERVIX     TUBAL LIGATION     Family History Family History  Problem Relation Age of Onset   Drug abuse Mother    Cancer Mother        cervix   Bipolar disorder Father    Drug abuse Father    Heart disease Other    Arthritis Other    Cancer Other    Asthma Other    Diabetes Other    Depression Sister    Anxiety disorder Sister    Bipolar disorder Paternal Uncle     Social History Social History   Tobacco Use   Smoking status: Former    Current packs/day: 0.00    Types: Cigarettes    Quit date: 04/26/2014    Years since quitting: 8.3   Smokeless tobacco: Never  Vaping Use   Vaping status: Never Used  Substance Use Topics   Alcohol use: No   Drug use: No   Allergies Imitrex [sumatriptan base]  Review of Systems Review of Systems  Respiratory:  Positive for shortness of breath.   Cardiovascular:  Positive for chest pain.  Musculoskeletal:  Positive for arthralgias and myalgias.    Physical Exam Vital Signs  I have reviewed the triage vital signs BP 112/78   Pulse 79   Temp 98.4 F (36.9 C) (Oral)   Resp 16   Ht 5\' 4"  (1.626 m)   Wt 90.7 kg   SpO2 97%   BMI 34.33 kg/m   Physical Exam Vitals and nursing note reviewed.  Constitutional:      General: Jackie Smith is not in acute distress.    Appearance: Jackie Smith is well-developed.  HENT:     Head: Normocephalic and atraumatic.  Eyes:     Conjunctiva/sclera: Conjunctivae normal.  Cardiovascular:     Rate and Rhythm: Normal rate and regular rhythm.     Heart sounds: No murmur heard. Pulmonary:     Effort: Pulmonary effort is normal. No respiratory distress.     Breath sounds: Normal breath sounds.  Abdominal:     Palpations: Abdomen is soft.     Tenderness: There is no abdominal tenderness.  Musculoskeletal:        General: No swelling.     Cervical back: Neck supple.  Skin:    General: Skin  is warm and dry.     Capillary Refill: Capillary refill  takes less than 2 seconds.  Neurological:     Mental Status: Jackie Smith is alert.  Psychiatric:        Mood and Affect: Mood normal.     ED Results and Treatments Labs (all labs ordered are listed, but only abnormal results are displayed) Labs Reviewed  CBC WITH DIFFERENTIAL/PLATELET - Abnormal; Notable for the following components:      Result Value   Hemoglobin 15.1 (*)    All other components within normal limits  COMPREHENSIVE METABOLIC PANEL - Abnormal; Notable for the following components:   Sodium 134 (*)    Calcium 8.7 (*)    Total Protein 6.0 (*)    Albumin 3.3 (*)    All other components within normal limits  SARS CORONAVIRUS 2 BY RT PCR  TROPONIN I (HIGH SENSITIVITY)                                                                                                                          Radiology DG Chest 2 View  Result Date: 08/23/2022 CLINICAL DATA:  Dyspnea. EXAM: CHEST - 2 VIEW COMPARISON:  January 16, 2022. FINDINGS: The heart size and mediastinal contours are within normal limits. Both lungs are clear. The visualized skeletal structures are unremarkable. IMPRESSION: No active cardiopulmonary disease. Electronically Signed   By: Lupita Raider M.D.   On: 08/23/2022 12:24    Pertinent labs & imaging results that were available during my care of the patient were reviewed by me and considered in my medical decision making (see MDM for details).  Medications Ordered in ED Medications  lactated ringers bolus 1,000 mL (0 mLs Intravenous Stopped 08/23/22 1332)  naproxen (NAPROSYN) tablet 500 mg (500 mg Oral Given 08/23/22 1335)                                                                                                                                     Procedures Procedures  (including critical care time)  Medical Decision Making / ED Course   This patient presents to the ED for concern of myalgias, chest pain, shortness of breath, this involves an extensive number  of treatment options, and is a complaint that carries with it a high risk of complications and morbidity.  The differential diagnosis includes viral illness, COVID-19, ACS, pneumonia  MDM: Patient  seen emerged from for evaluation of arthralgias, myalgias, chest pain, shortness of breath.  Physical exam is unremarkable putting negative cardiopulmonary exam.  ECG nonischemic.  COVID-negative.  Laboratory evaluation with mild hyponatremia 134, hypoalbuminemia to 3.3 but is otherwise unremarkable.  High-sensitivity troponin is negative.  Chest x-ray unremarkable.  Symptoms improved with Naprosyn and fluid resuscitation.  Patient is PERC negative and I have low suspicion for PE.  Heart score is less than 3 and I have low suspicion for ACS.  Patient presentation consistent with viral illness and at this time Jackie Smith does not meet inpatient criteria for admission.  Jackie Smith was then discharged with outpatient follow-up and return precautions of which Jackie Smith voiced understanding.   Additional history obtained:  -External records from outside source obtained and reviewed including: Chart review including previous notes, labs, imaging, consultation notes   Lab Tests: -I ordered, reviewed, and interpreted labs.   The pertinent results include:   Labs Reviewed  CBC WITH DIFFERENTIAL/PLATELET - Abnormal; Notable for the following components:      Result Value   Hemoglobin 15.1 (*)    All other components within normal limits  COMPREHENSIVE METABOLIC PANEL - Abnormal; Notable for the following components:   Sodium 134 (*)    Calcium 8.7 (*)    Total Protein 6.0 (*)    Albumin 3.3 (*)    All other components within normal limits  SARS CORONAVIRUS 2 BY RT PCR  TROPONIN I (HIGH SENSITIVITY)      EKG   EKG Interpretation Date/Time:  Tuesday August 23 2022 11:35:24 EDT Ventricular Rate:  100 PR Interval:  114 QRS Duration:  80 QT Interval:  328 QTC Calculation: 423 R Axis:   55  Text Interpretation: Normal  sinus rhythm Borderline ECG When compared with ECG of 26-Jun-2018 02:45, PREVIOUS ECG IS PRESENT Confirmed by Martell Mcfadyen ([id number]) on 08/23/2022 11:50:59 AM         Imaging Studies ordered: I ordered imaging studies including chest x-ray I independently visualized and interpreted imaging. I agree with the radiologist interpretation   Medicines ordered and prescription drug management: Meds ordered this encounter  Medications   lactated ringers bolus 1,000 mL   naproxen (NAPROSYN) tablet 500 mg    -I have reviewed the patients home medicines and have made adjustments as needed  Critical interventions none    Cardiac Monitoring: The patient was maintained on a cardiac monitor.  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   Reevaluation: After the interventions noted above, I reevaluated the patient and found that they have :improved  Co morbidities that complicate the patient evaluation  Past Medical History:  Diagnosis Date   Acid reflux    Bipolar 1 disorder (HCC)    Frequent UTI    Gastritis    History of benign breast tumor    Infections of kidney    Irritable bowel syndrome (IBS)    Thyroid disease    Type 2 diabetes mellitus (HCC) 05/02/2019   Vaginal Pap smear, abnormal       Dispostion: I considered admission for this patient, but at this time Jackie Smith does not meet inpatient criteria for admission and Jackie Smith is safe to discharge outpatient follow-up     Final Clinical Impression(s) / ED Diagnoses Final diagnoses:  Myalgia  Dyspnea, unspecified type     @PCDICTATION @    Josseline Reddin, [place],  MD 08/23/22 2143

## 2022-08-26 ENCOUNTER — Ambulatory Visit (INDEPENDENT_AMBULATORY_CARE_PROVIDER_SITE_OTHER): Payer: No Typology Code available for payment source | Admitting: Adult Health

## 2022-08-26 ENCOUNTER — Encounter: Payer: Self-pay | Admitting: Adult Health

## 2022-08-26 ENCOUNTER — Other Ambulatory Visit (HOSPITAL_COMMUNITY)
Admission: RE | Admit: 2022-08-26 | Discharge: 2022-08-26 | Disposition: A | Discharge status: Home / Self Care | Payer: No Typology Code available for payment source | Source: Ambulatory Visit | Attending: Adult Health | Admitting: Adult Health

## 2022-08-26 VITALS — BP 130/89 | HR 86 | Ht 64.0 in | Wt 216.0 lb

## 2022-08-26 DIAGNOSIS — Z113 Encounter for screening for infections with a predominantly sexual mode of transmission: Secondary | ICD-10-CM

## 2022-08-26 DIAGNOSIS — Z01419 Encounter for gynecological examination (general) (routine) without abnormal findings: Secondary | ICD-10-CM

## 2022-08-26 DIAGNOSIS — R1031 Right lower quadrant pain: Secondary | ICD-10-CM

## 2022-08-26 NOTE — Progress Notes (Signed)
Patient ID: Jackie Smith, female   DOB: 1985-08-21, 37 y.o.   MRN: [id number] History of Present Illness: Jackie Smith is a 37 year old white female, married, but separated for about 8 months, [id number], in for a well woman gyn exam and requests STD testing. 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 She has pain RLQ on and off, can be sharp at times.      Component Value Date/Time   DIAGPAP  04/28/2021 1329    - Negative for intraepithelial lesion or malignancy (NILM)   DIAGPAP  03/07/2018 0000    NEGATIVE FOR INTRAEPITHELIAL LESIONS OR MALIGNANCY.   HPVHIGH Negative 04/28/2021 1329   ADEQPAP  04/28/2021 1329    Satisfactory for evaluation; transformation zone component PRESENT.   ADEQPAP  03/07/2018 0000    Satisfactory for evaluation  endocervical/transformation zone component ABSENT.    PCP is Lawerance Sabal PA.   Current Medications, Allergies, Past Medical History, Past Surgical History, Family History and Social History were reviewed in [employer] record.     Review of Systems: Patient denies any headaches, hearing loss, fatigue, blurred vision, shortness of breath, chest pain, problems with bowel movements, urination, or intercourse(not currently active). No joint pain or mood swings. No bleeding sp ablation. See HPI for positives.    Physical Exam:BP 130/89 (BP Location: Right Arm, Patient Position: Sitting, Cuff Size: Normal)   Pulse 86   Ht 5\' 4"  (1.626 m)   Wt 216 lb (98 kg)   BMI 37.08 kg/m   General:  Well developed, well nourished, no acute distress Skin:  Warm and dry Neck:  Midline trachea, normal thyroid, good ROM, no lymphadenopathy Lungs; Clear to auscultation bilaterally Breast:  No dominant palpable mass, retraction, or nipple discharge Cardiovascular: Regular rate and rhythm Abdomen:  Soft, non tender, no hepatosplenomegaly Pelvic:  External genitalia is normal in appearance, no  lesions.  The vagina is normal in appearance. Urethra has no lesions or masses. The cervix is bulbous.  Uterus is felt to be normal size, shape, and contour.  No adnexal masses or tenderness noted.Bladder is non tender, no masses felt. Rectal: deferred Extremities/musculoskeletal:  No swelling or varicosities noted, no clubbing or cyanosis Psych:  No mood changes, alert and cooperative,seems happy AA is 0 Fall risk is low    08/26/2022   11:21 AM 04/28/2021    1:32 PM 05/01/2019    3:20 PM  Depression screen PHQ 2/9  Decreased Interest 0 0 3  Down, Depressed, Hopeless 0 0 0  PHQ - 2 Score 0 0 3  Altered sleeping 0 0 3  Tired, decreased energy 0 0 3  Change in appetite 0 0 1  Feeling bad or failure about yourself  0 0 0  Trouble concentrating 0 0 0  Moving slowly or fidgety/restless 0 0 0  Suicidal thoughts 0 0 0  PHQ-9 Score 0 0 10       08/26/2022   11:21 AM 04/28/2021    1:32 PM  GAD 7 : Generalized Anxiety Score  Nervous, Anxious, on Edge 0 0  Control/stop worrying 0 0  Worry too much - different things 0 0  Trouble relaxing 0 0  Restless 0 0  Easily annoyed or irritable 0 0  Afraid - awful might happen 0 0  Total GAD 7 Score 0 0      Upstream - 08/26/22 1121       Pregnancy  Intention Screening   Does the patient want to become pregnant in the next year? No    Does the patient's partner want to become pregnant in the next year? No    Would the patient like to discuss contraceptive options today? No      Contraception Wrap Up   Current Method Female Sterilization    End Method Female Sterilization    Contraception Counseling Provided No             Examination chaperoned by Faith Rogue LPN  Impression and Plan: 1. Encounter for well woman exam with routine gynecological exam Physical in 1 year  2. Screening examination for STD (sexually transmitted disease) CV swab sent for GC/CHL,trich and BV,yeast Check HIV and RPR - Cervicovaginal ancillary only(  [place]) - HIV Antibody (routine testing w rflx) - RPR  3. RLQ abdominal pain Has RLQ on and off Scheduled pelvic US 09/07/22 at 3:30 pm at [facility] to assess uterus and ovaries  - US PELVIC COMPLETE WITH TRANSVAGINAL; Future

## 2022-08-30 ENCOUNTER — Other Ambulatory Visit: Payer: Self-pay | Admitting: Adult Health

## 2022-08-30 MED ORDER — METRONIDAZOLE 500 MG PO TABS: 500.0000 mg | ORAL_TABLET | Freq: Two times a day (BID) | ORAL | 0 refills | Status: DC

## 2022-09-07 ENCOUNTER — Ambulatory Visit (HOSPITAL_COMMUNITY)
Admission: RE | Admit: 2022-09-07 | Discharge: 2022-09-07 | Disposition: A | Discharge status: Home / Self Care | Payer: No Typology Code available for payment source | Source: Ambulatory Visit | Attending: Adult Health | Admitting: Adult Health

## 2022-09-07 DIAGNOSIS — R1031 Right lower quadrant pain: Secondary | ICD-10-CM | POA: Insufficient documentation

## 2022-10-21 ENCOUNTER — Ambulatory Visit: Payer: No Typology Code available for payment source | Admitting: Adult Health

## 2022-10-26 ENCOUNTER — Encounter: Payer: Self-pay | Admitting: Adult Health

## 2022-10-26 ENCOUNTER — Ambulatory Visit (INDEPENDENT_AMBULATORY_CARE_PROVIDER_SITE_OTHER): Payer: No Typology Code available for payment source | Admitting: Adult Health

## 2022-10-26 VITALS — BP 123/86 | HR 77 | Ht 64.0 in | Wt 201.0 lb

## 2022-10-26 DIAGNOSIS — R1031 Right lower quadrant pain: Secondary | ICD-10-CM | POA: Diagnosis not present

## 2022-10-26 DIAGNOSIS — N83201 Unspecified ovarian cyst, right side: Secondary | ICD-10-CM | POA: Insufficient documentation

## 2022-10-26 NOTE — Progress Notes (Signed)
  Subjective:     Patient ID: Jackie Smith, female   DOB: [birth year], 37 y.o.   MRN: [id number]  HPI Jackie Smith is a 37 year old white female, married, [id number] back in follow up on RLQ pain and right ovarian cyst. No pain currently.     Component Value Date/Time   DIAGPAP  04/28/2021 1329    - Negative for intraepithelial lesion or malignancy (NILM)   DIAGPAP  03/07/2018 0000    NEGATIVE FOR INTRAEPITHELIAL LESIONS OR MALIGNANCY.   HPVHIGH Negative 04/28/2021 1329   ADEQPAP  04/28/2021 1329    Satisfactory for evaluation; transformation zone component PRESENT.   ADEQPAP  03/07/2018 0000    Satisfactory for evaluation  endocervical/transformation zone component ABSENT.    PCP is Lawerance Sabal PA. Review of Systems No RLQ pain currently Reviewed past medical,surgical, social and family history. Reviewed medications and allergies.     Objective:   Physical Exam BP 123/86 (BP Location: Left Arm, Patient Position: Sitting, Cuff Size: Normal)   Pulse 77   Ht 5\' 4"  (1.626 m)   Wt 201 lb (91.2 kg)   BMI 34.50 kg/m     Skin warm and dry.  Lungs: clear to ausculation bilaterally. Cardiovascular: regular rate and rhythm.   Upstream - 10/26/22 1451       Pregnancy Intention Screening   Does the patient want to become pregnant in the next year? No    Does the patient's partner want to become pregnant in the next year? No    Would the patient like to discuss contraceptive options today? No      Contraception Wrap Up   Current Method Female Sterilization    End Method Female Sterilization    Contraception Counseling Provided No             Assessment:     1. RLQ abdominal pain No pain currently  2. Cyst of right ovary Return in about 2 weeks for pelvic US in office to check for resolution - US PELVIC COMPLETE WITH TRANSVAGINAL; Future     Plan:     Pelvic US in about 2 weeks in office, will talk when results back

## 2022-11-10 ENCOUNTER — Ambulatory Visit: Payer: No Typology Code available for payment source

## 2022-11-10 DIAGNOSIS — N83201 Unspecified ovarian cyst, right side: Secondary | ICD-10-CM | POA: Diagnosis not present

## 2022-11-10 NOTE — Progress Notes (Signed)
PELVIC US TA/TV:heterogeneous anteverted uterus,EEC 5 mm,normal ovaries,multiple simple nabothian cysts,no free fluid,ovaries appear mobile,no pain during ultrasound  Chaperone Tish

## 2023-01-09 ENCOUNTER — Ambulatory Visit (HOSPITAL_COMMUNITY)
Admission: RE | Admit: 2023-01-09 | Discharge: 2023-01-09 | Disposition: A | Discharge status: Home / Self Care | Payer: No Typology Code available for payment source | Source: Ambulatory Visit | Attending: Cardiovascular Disease | Admitting: Cardiovascular Disease

## 2023-01-09 DIAGNOSIS — R002 Palpitations: Secondary | ICD-10-CM | POA: Insufficient documentation

## 2023-01-09 LAB — ECHOCARDIOGRAM COMPLETE
Area-P 1/2: 2.73 cm2
S' Lateral: 2.4 cm

## 2023-01-09 NOTE — Progress Notes (Signed)
*  PRELIMINARY RESULTS* Echocardiogram 2D Echocardiogram has been performed.  [name] 01/09/2023, 3:53 PM

## 2023-02-20 ENCOUNTER — Encounter (HOSPITAL_COMMUNITY): Payer: Self-pay

## 2023-03-28 NOTE — H&P (View-Only) (Signed)
GI Office Note    Referring Provider: Lawerance Sabal, [place] Primary Care Physician:  Lawerance Sabal, [place]  Primary Gastroenterologist: Hennie Duos. Marletta Lor, DO   Chief Complaint   Chief Complaint  Patient presents with   Diverticulitis    Had diverticulitis in January and ER sent her here to have a colonoscopy     History of Present Illness   Jackie Smith is a 38 y.o. female presenting today for further evaluation of diverticulitis, intermittent abdominal pain, colonoscopy at the request of Lawerance Sabal, PA-C  Patient seen in the ED back in January with abdominal pain.  Symptoms initially started out with several days of feeling like she could not have a bowel movement.  Typically with regular BMs.  Took MiraLAX initially then followed by Ex-Lax and other laxatives.  Began having left-sided abdominal pain.  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  01/2023: White blood cell count 8600, hemoglobin 13.3, platelets 196,000, sodium 142, creatinine 1, albumin 3.5, total bilirubin 0.4, alk phos 69, AST 12, ALT 27, lipase 65.  CT A/P with contrast 01/2023: 1. Acute uncomplicated diverticulitis of the distal descending  colon.  2. No urinary tract calculi or findings of obstructive uropathy.  3. Streaky ground-glass attenuation within the left lower lobe  partially imaged at the edge of the field of view. This may  represent atelectasis or an infectious or inflammatory process.   Follow-up labs with PCP in February: WBC 6.9, Hgb 14.1, Platelets 207, HCG <1.  Today: BM regular. Abdominal pain resolved. No melena, brbpr. Heartburn, TUMS. Does not like to take medications. Try to avoid trigger foods. Tries to sleep on the left side. Has daily heartburn.  Has had typical heartburn symptoms her entire life.  All of her children have heartburn.  He has been seen by ENT in the past was  told she had reflux noted on their fiberoptic study.  No dysphagia.   No upper endoscopy. No colonoscopy.    Medications   Current Outpatient Medications  Medication Sig Dispense Refill   BIOTIN PO Take by mouth.     cholecalciferol (VITAMIN D3) 25 MCG (1000 UNIT) tablet Take 1,000 Units by mouth daily.     COENZYME Q-10 PO Take by mouth.     magnesium 30 MG tablet Take 30 mg by mouth daily.     MOUNJARO 10 MG/0.5ML Pen SMARTSIG:10 Milligram(s) SUB-Q Once a Week     Omega-3 Fatty Acids (FISH OIL PO) Take by mouth.     Probiotic Product (PROBIOTIC PO) Take by mouth.     vitamin B-12 (CYANOCOBALAMIN) 500 MCG tablet Take 500 mcg by mouth daily.     No current facility-administered medications for this visit.    Allergies   Allergies as of 03/29/2023 - Review Complete 03/29/2023  Allergen Reaction Noted   Imitrex [sumatriptan base] Nausea And Vomiting 05/31/2010    Past Medical History   Past Medical History:  Diagnosis Date   Acid reflux    Bipolar 1 disorder (HCC)    Frequent UTI    Gastritis    History of benign breast tumor    Infections of kidney    Irritable bowel syndrome (IBS)    Thyroid disease    Type 2 diabetes mellitus (HCC) 05/02/2019   Vaginal Pap smear, abnormal     Past Surgical History   Past Surgical History:  Procedure  Laterality Date   c sections     DILATION AND CURETTAGE OF UTERUS     INDUCED ABORTION     LASER ABLATION OF THE CERVIX     TUBAL LIGATION      Past Family History   Family History  Problem Relation Age of Onset   Drug abuse Mother    Cancer Mother        cervix   Other Mother        MVA   Bipolar disorder Father    Drug abuse Father    Alcoholism Father    Depression Sister    Anxiety disorder Sister    Skin cancer Maternal Grandmother    Bipolar disorder Paternal Uncle    Heart disease Other    Arthritis Other    Cancer Other    Asthma Other    Diabetes Other    Colon cancer Neg Hx     Past Social History    Social History   Socioeconomic History   Marital status: Married    Spouse name: Not on file   Number of children: Not on file   Years of education: college   Highest education level: Not on file  Occupational History   Occupation: call center office    Employer: [employer]  Tobacco Use   Smoking status: Former    Current packs/day: 0.00    Types: Cigarettes    Quit date: 04/26/2014    Years since quitting: 8.9   Smokeless tobacco: Never  Vaping Use   Vaping status: Never Used  Substance and Sexual Activity   Alcohol use: No   Drug use: No   Sexual activity: Yes    Birth control/protection: Surgical    Comment: tubal   Other Topics Concern   Not on file  Social History Narrative   Not on file   Social Drivers of Health   Financial Resource Strain: Low Risk  (08/26/2022)   Overall Financial Resource Strain (CARDIA)    Difficulty of Paying Living Expenses: Not hard at all  Food Insecurity: No Food Insecurity (08/26/2022)   Hunger Vital Sign    Worried About Running Out of Food in the Last Year: Never true    Ran Out of Food in the Last Year: Never true  Transportation Needs: No Transportation Needs (08/26/2022)   PRAPARE - Administrator, Civil Service (Medical): No    Lack of Transportation (Non-Medical): No  Physical Activity: Sufficiently Active (08/26/2022)   Exercise Vital Sign    Days of Exercise per Week: 3 days    Minutes of Exercise per Session: 100 min  Stress: No Stress Concern Present (08/26/2022)   [employer] of Occupational Health - Occupational Stress Questionnaire    Feeling of Stress : Not at all  Social Connections: Moderately Integrated (08/26/2022)   Social Connection and Isolation Panel [NHANES]    Frequency of Communication with Friends and Family: Three times a week    Frequency of Social Gatherings with Friends and Family: Once a week    Attends Religious Services: More than 4 times per year    Active Member of [employer] or  Organizations: No    Attends Banker Meetings: Never    Marital Status: Married  Catering manager Violence: Not At Risk (08/26/2022)   Humiliation, Afraid, Rape, and Kick questionnaire    Fear of Current or Ex-Partner: No    Emotionally Abused: No    Physically Abused: No  Sexually Abused: No    Review of Systems   General: Negative for anorexia, weight loss, fever, chills, fatigue, weakness. Eyes: Negative for vision changes.  ENT: Negative for hoarseness, difficulty swallowing , nasal congestion. CV: Negative for chest pain, angina, palpitations, dyspnea on exertion, peripheral edema.  Respiratory: Negative for dyspnea at rest, dyspnea on exertion, cough, sputum, wheezing.  GI: See history of present illness. GU:  Negative for dysuria, hematuria, urinary incontinence, urinary frequency, nocturnal urination.  MS: Negative for joint pain, low back pain.  Derm: Negative for rash or itching.  Neuro: Negative for weakness, abnormal sensation, seizure, frequent headaches, memory loss,  confusion.  Psych: Negative for anxiety, depression, suicidal ideation, hallucinations.  Endo: Negative for unusual weight change.  Heme: Negative for bruising or bleeding. Allergy: Negative for rash or hives.  Physical Exam   BP 109/74 (BP Location: Right Arm, Patient Position: Sitting, Cuff Size: Large)   Pulse 80   Temp 98 F (36.7 C) (Oral)   Ht 5\' 4"  (1.626 m)   Wt 195 lb 12.8 oz (88.8 kg)   LMP  (LMP Unknown)   SpO2 98%   BMI 33.61 kg/m    General: Well-nourished, well-developed in no acute distress.  Head: Normocephalic, atraumatic.   Eyes: Conjunctiva pink, no icterus. Mouth: Oropharyngeal mucosa moist and pink   Neck: Supple without thyromegaly, masses, or lymphadenopathy.  Lungs: Clear to auscultation bilaterally.  Heart: Regular rate and rhythm, no murmurs rubs or gallops.  Abdomen: Bowel sounds are normal, nontender, nondistended, no hepatosplenomegaly or masses,   no abdominal bruits or hernia, no rebound or guarding.   Rectal: not performed Extremities: No lower extremity edema. No clubbing or deformities.  Neuro: Alert and oriented x 4 , grossly normal neurologically.  Skin: Warm and dry, no rash or jaundice.   Psych: Alert and cooperative, normal mood and affect.  Labs   See hpi  Imaging Studies   No results found.  Assessment/Plan:   Acute uncomplicated diverticulitis: -Recent episode, first episode, noted on CT scan.  Symptoms resolved with antibiotics. -She needs to have a colonoscopy to evaluate abnormal colon seen on CT. -Increase dietary fiber. -Continue daily fiber supplement. -colonoscopy. ASA 2.  I have discussed the risks, alternatives, benefits with regards to but not limited to the risk of reaction to medication, bleeding, infection, perforation and the patient is agreeable to proceed. Written consent to be obtained.  Chronic GERD: -Tries to avoid food triggers, uses over-the-counter antacids.  Prefers not to take prescription medications. -Currently weaning off Mounjaro -Denies dysphagia or other red flag symptoms. -Due to the chronicity of her reflux, central obesity, would offer her Barrett's screening -EGD in the near future.  ASA 2.  I have discussed the risks, alternatives, benefits with regards to but not limited to the risk of reaction to medication, bleeding, infection, perforation and the patient is agreeable to proceed. Written consent to be obtained.    Leanna Battles. Melvyn Neth, MHS, PA-C [facility]

## 2023-03-28 NOTE — Progress Notes (Unsigned)
GI Office Note    Referring Provider: Lawerance Sabal, [place] Primary Care Physician:  Lawerance Sabal, [place]  Primary Gastroenterologist:  Chief Complaint   No chief complaint on file.    History of Present Illness   Jackie Smith is a 38 y.o. female presenting today for further evaluation of diverticulitis, intermittent abdominal pain, colonoscopy.   01/2023: ***  02/2023: WBC 6.9, Hgb 14.1, Platelets 207, HCG <1.  CT A/P with contrast 01/2023: 1. Acute uncomplicated diverticulitis of the distal descending  colon.  2. No urinary tract calculi or findings of obstructive uropathy.  3. Streaky ground-glass attenuation within the left lower lobe  partially imaged at the edge of the field of view. This may  represent atelectasis or an infectious or inflammatory process.     Medications   Current Outpatient Medications  Medication Sig Dispense Refill   BIOTIN PO Take by mouth.     cholecalciferol (VITAMIN D3) 25 MCG (1000 UNIT) tablet Take 1,000 Units by mouth daily.     COENZYME Q-10 PO Take by mouth.     magnesium 30 MG tablet Take 30 mg by mouth 2 (two) times daily.     MOUNJARO 10 MG/0.5ML Pen SMARTSIG:10 Milligram(s) SUB-Q Once a Week     Omega-3 Fatty Acids (FISH OIL PO) Take by mouth.     Probiotic Product (PROBIOTIC PO) Take by mouth.     vitamin B-12 (CYANOCOBALAMIN) 500 MCG tablet Take 500 mcg by mouth daily.     No current facility-administered medications for this visit.    Allergies   Allergies as of 03/29/2023 - Review Complete 10/26/2022  Allergen Reaction Noted   Imitrex [sumatriptan base] Nausea And Vomiting 05/31/2010    Past Medical History   Past Medical History:  Diagnosis Date   Acid reflux    Bipolar 1 disorder (HCC)    Frequent UTI    Gastritis    History of benign breast tumor    Infections of kidney    Irritable bowel syndrome (IBS)    Thyroid disease    Type 2 diabetes mellitus (HCC) 05/02/2019   Vaginal Pap smear,  abnormal     Past Surgical History   Past Surgical History:  Procedure Laterality Date   c sections     DILATION AND CURETTAGE OF UTERUS     INDUCED ABORTION     LASER ABLATION OF THE CERVIX     TUBAL LIGATION      Past Family History   Family History  Problem Relation Age of Onset   Drug abuse Mother    Cancer Mother        cervix   Bipolar disorder Father    Drug abuse Father    Heart disease Other    Arthritis Other    Cancer Other    Asthma Other    Diabetes Other    Depression Sister    Anxiety disorder Sister    Bipolar disorder Paternal Uncle     Past Social History   Social History   Socioeconomic History   Marital status: Married    Spouse name: Not on file   Number of children: Not on file   Years of education: college   Highest education level: Not on file  Occupational History   Occupation: call center office    Employer: [employer]  Tobacco Use   Smoking status: Former    Current packs/day: 0.00    Types: Cigarettes    Quit  date: 04/26/2014    Years since quitting: 8.9   Smokeless tobacco: Never  Vaping Use   Vaping status: Never Used  Substance and Sexual Activity   Alcohol use: No   Drug use: No   Sexual activity: Yes    Birth control/protection: Surgical    Comment: tubal   Other Topics Concern   Not on file  Social History Narrative   Not on file   Social Drivers of Health   Financial Resource Strain: Low Risk  (08/26/2022)   Overall Financial Resource Strain (CARDIA)    Difficulty of Paying Living Expenses: Not hard at all  Food Insecurity: No Food Insecurity (08/26/2022)   Hunger Vital Sign    Worried About Running Out of Food in the Last Year: Never true    Ran Out of Food in the Last Year: Never true  Transportation Needs: No Transportation Needs (08/26/2022)   PRAPARE - Administrator, Civil Service (Medical): No    Lack of Transportation (Non-Medical): No  Physical Activity: Sufficiently Active (08/26/2022)    Exercise Vital Sign    Days of Exercise per Week: 3 days    Minutes of Exercise per Session: 100 min  Stress: No Stress Concern Present (08/26/2022)   [employer] of Occupational Health - Occupational Stress Questionnaire    Feeling of Stress : Not at all  Social Connections: Moderately Integrated (08/26/2022)   Social Connection and Isolation Panel [NHANES]    Frequency of Communication with Friends and Family: Three times a week    Frequency of Social Gatherings with Friends and Family: Once a week    Attends Religious Services: More than 4 times per year    Active Member of [employer] or Organizations: No    Attends Banker Meetings: Never    Marital Status: Married  Catering manager Violence: Not At Risk (08/26/2022)   Humiliation, Afraid, Rape, and Kick questionnaire    Fear of Current or Ex-Partner: No    Emotionally Abused: No    Physically Abused: No    Sexually Abused: No    Review of Systems   General: Negative for anorexia, weight loss, fever, chills, fatigue, weakness. Eyes: Negative for vision changes.  ENT: Negative for hoarseness, difficulty swallowing , nasal congestion. CV: Negative for chest pain, angina, palpitations, dyspnea on exertion, peripheral edema.  Respiratory: Negative for dyspnea at rest, dyspnea on exertion, cough, sputum, wheezing.  GI: See history of present illness. GU:  Negative for dysuria, hematuria, urinary incontinence, urinary frequency, nocturnal urination.  MS: Negative for joint pain, low back pain.  Derm: Negative for rash or itching.  Neuro: Negative for weakness, abnormal sensation, seizure, frequent headaches, memory loss,  confusion.  Psych: Negative for anxiety, depression, suicidal ideation, hallucinations.  Endo: Negative for unusual weight change.  Heme: Negative for bruising or bleeding. Allergy: Negative for rash or hives.  Physical Exam   There were no vitals taken for this visit.   General: Well-nourished,  well-developed in no acute distress.  Head: Normocephalic, atraumatic.   Eyes: Conjunctiva pink, no icterus. Mouth: Oropharyngeal mucosa moist and pink , no lesions erythema or exudate. Neck: Supple without thyromegaly, masses, or lymphadenopathy.  Lungs: Clear to auscultation bilaterally.  Heart: Regular rate and rhythm, no murmurs rubs or gallops.  Abdomen: Bowel sounds are normal, nontender, nondistended, no hepatosplenomegaly or masses,  no abdominal bruits or hernia, no rebound or guarding.   Rectal: *** Extremities: No lower extremity edema. No clubbing or deformities.  Neuro:  Alert and oriented x 4 , grossly normal neurologically.  Skin: Warm and dry, no rash or jaundice.   Psych: Alert and cooperative, normal mood and affect.  Labs   *** Imaging Studies   No results found.  Assessment       PLAN   ***   Leanna Battles. Melvyn Neth, MHS, PA-C [facility]

## 2023-03-29 ENCOUNTER — Other Ambulatory Visit: Payer: Self-pay | Admitting: *Deleted

## 2023-03-29 ENCOUNTER — Encounter: Payer: Self-pay | Admitting: Gastroenterology

## 2023-03-29 ENCOUNTER — Encounter: Payer: Self-pay | Admitting: *Deleted

## 2023-03-29 ENCOUNTER — Ambulatory Visit (INDEPENDENT_AMBULATORY_CARE_PROVIDER_SITE_OTHER): Admitting: Gastroenterology

## 2023-03-29 ENCOUNTER — Telehealth: Payer: Self-pay | Admitting: *Deleted

## 2023-03-29 VITALS — BP 109/74 | HR 80 | Temp 98.0°F | Ht 64.0 in | Wt 195.8 lb

## 2023-03-29 DIAGNOSIS — R933 Abnormal findings on diagnostic imaging of other parts of digestive tract: Secondary | ICD-10-CM | POA: Insufficient documentation

## 2023-03-29 DIAGNOSIS — K219 Gastro-esophageal reflux disease without esophagitis: Secondary | ICD-10-CM

## 2023-03-29 DIAGNOSIS — Z8719 Personal history of other diseases of the digestive system: Secondary | ICD-10-CM

## 2023-03-29 DIAGNOSIS — K5732 Diverticulitis of large intestine without perforation or abscess without bleeding: Secondary | ICD-10-CM | POA: Insufficient documentation

## 2023-03-29 MED ORDER — PEG 3350-KCL-NA BICARB-NACL 420 G PO SOLR: 4000.0000 mL | Freq: Once | ORAL | 0 refills | Status: AC

## 2023-03-29 NOTE — Patient Instructions (Signed)
 Upper endoscopy and colonoscopy to be scheduled. If you have recurrent abdominal pain lasting more than 24 hours, please let me know.  We do not want to perform a colonoscopy with active diverticulitis because of risk of bowel perforation. Continue daily fiber supplement to reduce your chances of recurrent diverticulitis.

## 2023-03-29 NOTE — Telephone Encounter (Signed)
 [employer] PA:  Notification or Prior Authorization is not required for the requested services You are not required to submit a notification/prior authorization based on the information provided. The number above acknowledges your inquiry and our response. Please reference this number for future inquiries. Notification is not a guarantee of coverage or payment. Questions should be directed to [email or web address] > Eligibility or [phone]. Decision ID #: [id number]

## 2023-03-30 ENCOUNTER — Encounter: Payer: Self-pay | Admitting: Gastroenterology

## 2023-04-13 ENCOUNTER — Encounter: Payer: Self-pay | Admitting: *Deleted

## 2023-04-13 NOTE — Telephone Encounter (Signed)
 Pt states she had made the decision only to have the colonoscopy done. She states she does not want to have the EGD done at this time. Also instructions for colonoscopy prep sent via mychart to pt.

## 2023-04-13 NOTE — Telephone Encounter (Signed)
 noted

## 2023-04-13 NOTE — Telephone Encounter (Signed)
 LMOVM to return call   Pt left vm about upcoming procedure

## 2023-04-25 ENCOUNTER — Encounter (HOSPITAL_COMMUNITY): Payer: Self-pay | Admitting: Internal Medicine

## 2023-04-25 ENCOUNTER — Ambulatory Visit (HOSPITAL_COMMUNITY): Admitting: Anesthesiology

## 2023-04-25 ENCOUNTER — Ambulatory Visit (HOSPITAL_COMMUNITY)
Admission: RE | Admit: 2023-04-25 | Discharge: 2023-04-25 | Disposition: A | Discharge status: Home / Self Care | Attending: Internal Medicine | Admitting: Internal Medicine

## 2023-04-25 ENCOUNTER — Other Ambulatory Visit: Payer: Self-pay

## 2023-04-25 ENCOUNTER — Encounter (HOSPITAL_COMMUNITY)
Admission: RE | Disposition: A | Discharge status: Home / Self Care | Payer: Self-pay | Source: Home / Self Care | Attending: Internal Medicine

## 2023-04-25 DIAGNOSIS — K219 Gastro-esophageal reflux disease without esophagitis: Secondary | ICD-10-CM | POA: Diagnosis not present

## 2023-04-25 DIAGNOSIS — Z09 Encounter for follow-up examination after completed treatment for conditions other than malignant neoplasm: Secondary | ICD-10-CM | POA: Insufficient documentation

## 2023-04-25 DIAGNOSIS — Z1211 Encounter for screening for malignant neoplasm of colon: Secondary | ICD-10-CM | POA: Diagnosis not present

## 2023-04-25 DIAGNOSIS — J45909 Unspecified asthma, uncomplicated: Secondary | ICD-10-CM | POA: Insufficient documentation

## 2023-04-25 DIAGNOSIS — K635 Polyp of colon: Secondary | ICD-10-CM | POA: Diagnosis not present

## 2023-04-25 DIAGNOSIS — I10 Essential (primary) hypertension: Secondary | ICD-10-CM | POA: Diagnosis not present

## 2023-04-25 DIAGNOSIS — K648 Other hemorrhoids: Secondary | ICD-10-CM | POA: Insufficient documentation

## 2023-04-25 DIAGNOSIS — K589 Irritable bowel syndrome without diarrhea: Secondary | ICD-10-CM | POA: Insufficient documentation

## 2023-04-25 DIAGNOSIS — G4730 Sleep apnea, unspecified: Secondary | ICD-10-CM | POA: Insufficient documentation

## 2023-04-25 DIAGNOSIS — K649 Unspecified hemorrhoids: Secondary | ICD-10-CM | POA: Diagnosis not present

## 2023-04-25 DIAGNOSIS — Z87891 Personal history of nicotine dependence: Secondary | ICD-10-CM | POA: Diagnosis not present

## 2023-04-25 DIAGNOSIS — K5732 Diverticulitis of large intestine without perforation or abscess without bleeding: Secondary | ICD-10-CM | POA: Diagnosis not present

## 2023-04-25 DIAGNOSIS — E119 Type 2 diabetes mellitus without complications: Secondary | ICD-10-CM | POA: Diagnosis not present

## 2023-04-25 DIAGNOSIS — K5730 Diverticulosis of large intestine without perforation or abscess without bleeding: Secondary | ICD-10-CM | POA: Insufficient documentation

## 2023-04-25 HISTORY — PX: COLONOSCOPY: SHX5424

## 2023-04-25 LAB — GLUCOSE, CAPILLARY: Glucose-Capillary: 81 mg/dL (ref 70–99)

## 2023-04-25 LAB — POCT PREGNANCY, URINE: Preg Test, Ur: NEGATIVE

## 2023-04-25 SURGERY — COLONOSCOPY
Anesthesia: General

## 2023-04-25 MED ORDER — PHENYLEPHRINE HCL (PRESSORS) 10 MG/ML IV SOLN
INTRAVENOUS | Status: DC | PRN
  Administered 2023-04-25 (×2): 80 ug via INTRAVENOUS

## 2023-04-25 MED ORDER — GLYCOPYRROLATE PF 0.2 MG/ML IJ SOSY
PREFILLED_SYRINGE | INTRAMUSCULAR | Status: DC | PRN
  Administered 2023-04-25: .2 mg via INTRAVENOUS

## 2023-04-25 MED ORDER — PROPOFOL 10 MG/ML IV BOLUS
INTRAVENOUS | Status: DC | PRN
Start: 2023-04-25 — End: 2023-04-25
  Administered 2023-04-25: 150 ug/kg/min via INTRAVENOUS

## 2023-04-25 MED ORDER — LACTATED RINGERS IV SOLN
INTRAVENOUS | Status: DC | PRN
Start: 2023-04-25 — End: 2023-04-25

## 2023-04-25 MED ORDER — LACTATED RINGERS IV SOLN
INTRAVENOUS | Status: DC
Start: 2023-04-25 — End: 2023-04-25

## 2023-04-25 MED ORDER — LIDOCAINE HCL (CARDIAC) PF 100 MG/5ML IV SOSY
PREFILLED_SYRINGE | INTRAVENOUS | Status: DC | PRN
  Administered 2023-04-25: 80 mg via INTRATRACHEAL

## 2023-04-25 NOTE — Transfer of Care (Signed)
Immediate Anesthesia Transfer of Care Note  Patient: Jackie Smith  Procedure(s) Performed: COLONOSCOPY  Patient Location: PACU and Endoscopy Unit  Anesthesia Type:MAC  Level of Consciousness: awake and alert   Airway & Oxygen Therapy: Patient Spontanous Breathing and Patient connected to nasal cannula oxygen  Post-op Assessment: Report given to RN and Post -op Vital signs reviewed and stable  Post vital signs: Reviewed and stable  Last Vitals:  Vitals Value Taken Time  BP 101/77 04/25/23 1015  Temp 36.4 C 04/25/23 1015  Pulse 89 04/25/23 1015  Resp 13 04/25/23 1015  SpO2 95 % 04/25/23 1015    Last Pain:  Vitals:   04/25/23 1015  TempSrc: Oral  PainSc: 0-No pain      Patients Stated Pain Goal: 10 (04/25/23 0908)  Complications: No notable events documented.

## 2023-04-25 NOTE — Discharge Instructions (Addendum)
  Colonoscopy Discharge Instructions  Read the instructions outlined below and refer to this sheet in the next few weeks. These discharge instructions provide you with general information on caring for yourself after you leave the hospital. Your doctor may also give you specific instructions. While your treatment has been planned according to the most current medical practices available, unavoidable complications occasionally occur.   ACTIVITY You may resume your regular activity, but move at a slower pace for the next 24 hours.  Take frequent rest periods for the next 24 hours.  Walking will help get rid of the air and reduce the bloated feeling in your belly (abdomen).  No driving for 24 hours (because of the medicine (anesthesia) used during the test).   Do not sign any important legal documents or operate any machinery for 24 hours (because of the anesthesia used during the test).  NUTRITION Drink plenty of fluids.  You may resume your normal diet as instructed by your doctor.  Begin with a light meal and progress to your normal diet. Heavy or fried foods are harder to digest and may make you feel sick to your stomach (nauseated).  Avoid alcoholic beverages for 24 hours or as instructed.  MEDICATIONS You may resume your normal medications unless your doctor tells you otherwise.  WHAT YOU CAN EXPECT TODAY Some feelings of bloating in the abdomen.  Passage of more gas than usual.  Spotting of blood in your stool or on the toilet paper.  IF YOU HAD POLYPS REMOVED DURING THE COLONOSCOPY: No aspirin products for 7 days or as instructed.  No alcohol for 7 days or as instructed.  Eat a soft diet for the next 24 hours.  FINDING OUT THE RESULTS OF YOUR TEST Not all test results are available during your visit. If your test results are not back during the visit, make an appointment with your caregiver to find out the results. Do not assume everything is normal if you have not heard from your  caregiver or the medical facility. It is important for you to follow up on all of your test results.  SEEK IMMEDIATE MEDICAL ATTENTION IF: You have more than a spotting of blood in your stool.  Your belly is swollen (abdominal distention).  You are nauseated or vomiting.  You have a temperature over 101.  You have abdominal pain or discomfort that is severe or gets worse throughout the day.   Your colonoscopy revealed 4 small polyp(s) which I removed successfully. These are likely benign hyperplastic and nothing to worry about.Await pathology results, my office will contact you. I recommend repeating colonoscopy in 5-10 years for surveillance purposes depending on pathology results.  You also have diverticulosis and internal hemorrhoids. I would recommend increasing fiber in your diet or adding OTC Benefiber/Metamucil. Be sure to drink at least 4 to 6 glasses of water daily. Follow-up with GI as needed.   I hope you have a great rest of your week!  Hennie Duos. Marletta Lor, D.O. Gastroenterology and Hepatology [facility]

## 2023-04-25 NOTE — Anesthesia Preprocedure Evaluation (Signed)
 Anesthesia Evaluation  Patient identified by MRN, date of birth, ID band Patient awake    Reviewed: Allergy & Precautions, H&P , NPO status , Patient's Chart, lab work & pertinent test results, reviewed documented beta blocker date and time   Airway Mallampati: II  TM Distance: >3 FB Neck ROM: full    Dental no notable dental hx.    Pulmonary neg pulmonary ROS, asthma , sleep apnea , former smoker   Pulmonary exam normal breath sounds clear to auscultation       Cardiovascular Exercise Tolerance: Good hypertension, negative cardio ROS  Rhythm:regular Rate:Normal     Neuro/Psych  PSYCHIATRIC DISORDERS  Depression Bipolar Disorder   negative neurological ROS  negative psych ROS   GI/Hepatic negative GI ROS, Neg liver ROS,GERD  ,,  Endo/Other  negative endocrine ROSdiabetesHypothyroidism    Renal/GU Renal diseasenegative Renal ROS  negative genitourinary   Musculoskeletal   Abdominal   Peds  Hematology negative hematology ROS (+)   Anesthesia Other Findings   Reproductive/Obstetrics negative OB ROS                             Anesthesia Physical Anesthesia Plan  ASA: 2  Anesthesia Plan: General   Post-op Pain Management:    Induction:   PONV Risk Score and Plan: Propofol infusion  Airway Management Planned:   Additional Equipment:   Intra-op Plan:   Post-operative Plan:   Informed Consent: I have reviewed the patients History and Physical, chart, labs and discussed the procedure including the risks, benefits and alternatives for the proposed anesthesia with the patient or authorized representative who has indicated his/her understanding and acceptance.     Dental Advisory Given  Plan Discussed with: CRNA  Anesthesia Plan Comments:        Anesthesia Quick Evaluation

## 2023-04-25 NOTE — Interval H&P Note (Signed)
History and Physical Interval Note:  04/25/2023 9:36 AM  Jackie Smith  has presented today for surgery, with the diagnosis of abnormal colon on CT.  The various methods of treatment have been discussed with the patient and family. After consideration of risks, benefits and other options for treatment, the patient has consented to  Procedure(s) with comments: COLONOSCOPY (N/A) - 9:30 am, asa 2 as a surgical intervention.  The patient's history has been reviewed, patient examined, no change in status, stable for surgery.  I have reviewed the patient's chart and labs.  Questions were answered to the patient's satisfaction.     Lanelle Bal

## 2023-04-25 NOTE — Op Note (Signed)
 [facility] Patient Name: Jackie Smith Procedure Date: 04/25/2023 9:36 AM MRN: [id number] Date of Birth: May 08, 1985 Attending MD: Hennie Duos. Marletta Lor , [place], [id number] CSN: [id number] Age: 38 Admit Type: Outpatient Procedure:                Colonoscopy Indications:              Follow-up of diverticulitis Providers:                Hennie Duos. Marletta Lor, DO, Crystal Page, [employer].                            Jessee Avers, Technician Referring MD:              Medicines:                See the Anesthesia note for documentation of the                            administered medications Complications:            No immediate complications. Estimated Blood Loss:     Estimated blood loss was minimal. Procedure:                Pre-Anesthesia Assessment:                           - The anesthesia plan was to use monitored                            anesthesia care (MAC).                           After obtaining informed consent, the colonoscope                            was passed under direct vision. Throughout the                            procedure, the patient's blood pressure, pulse, and                            oxygen saturations were monitored continuously. The                            PCF-HQ190L ([id number]) scope was introduced through                            the anus and advanced to the the terminal ileum,                            with identification of the appendiceal orifice and                            IC valve. The colonoscopy was performed without                            difficulty. The patient tolerated the  procedure                            well. The quality of the bowel preparation was                            evaluated using the BBPS [facility] Bowel Preparation                            Scale) with scores of: Right Colon = 3 (entire                            mucosa seen well with no residual staining, small                            fragments of stool or  opaque liquid), Transverse                            Colon = 2 (minor amount of residual staining, small                            fragments of stool and/or opaque liquid, but mucosa                            seen well) and Left Colon = 2 (minor amount of                            residual staining, small fragments of stool and/or                            opaque liquid, but mucosa seen well). The total                            BBPS score equals 7. The quality of the bowel                            preparation was good. Scope In: 9:49:36 AM Scope Out: 10:07:24 AM Scope Withdrawal Time: 0 hours 12 minutes 32 seconds  Total Procedure Duration: 0 hours 17 minutes 48 seconds  Findings:      Non-bleeding internal hemorrhoids were found during endoscopy.      Multiple medium-mouthed and small-mouthed diverticula were found in the       sigmoid colon and descending colon. **Diverticulitis has healed**      Four sessile polyps were found in the sigmoid colon. The polyps were 4       to 6 mm in size. These polyps were removed with a cold snare. Resection       and retrieval were complete.      The terminal ileum appeared normal.      The exam was otherwise without abnormality. Impression:               - Non-bleeding internal hemorrhoids.                           -  Diverticulosis in the sigmoid colon and in the                            descending colon.                           - Four 4 to 6 mm polyps in the sigmoid colon,                            removed with a cold snare. Resected and retrieved.                           - The examined portion of the ileum was normal.                           - The examination was otherwise normal. Moderate Sedation:      Per Anesthesia Care Recommendation:           - Patient has a contact number available for                            emergencies. The signs and symptoms of potential                            delayed complications were  discussed with the                            patient. Return to normal activities tomorrow.                            Written discharge instructions were provided to the                            patient.                           - Resume previous diet.                           - Continue present medications.                           - Await pathology results.                           - Repeat colonoscopy in 5-10 years for surveillance.                           - Return to GI clinic PRN. Procedure Code(s):        --- Professional ---                           [phone], Colonoscopy, flexible; with removal of                            tumor(s), polyp(s), or other lesion(s)  by snare                            technique Diagnosis Code(s):        --- Professional ---                           K64.8, Other hemorrhoids                           D12.5, Benign neoplasm of sigmoid colon                           K57.32, Diverticulitis of large intestine without                            perforation or abscess without bleeding                           K57.30, Diverticulosis of large intestine without                            perforation or abscess without bleeding CPT copyright 2022 American Medical Association. All rights reserved. The codes documented in this report are preliminary and upon coder review may  be revised to meet current compliance requirements. Hennie Duos. Marletta Lor, DO Hennie Duos. Marletta Lor, DO 04/25/2023 10:14:50 AM This report has been signed electronically. Number of Addenda: 0

## 2023-04-26 ENCOUNTER — Encounter (HOSPITAL_COMMUNITY): Payer: Self-pay | Admitting: Internal Medicine

## 2023-04-26 LAB — SURGICAL PATHOLOGY

## 2023-04-26 NOTE — Anesthesia Postprocedure Evaluation (Signed)
Anesthesia Post Note  Patient: Jackie Smith  Procedure(s) Performed: COLONOSCOPY  Patient location during evaluation: Phase II Anesthesia Type: General Level of consciousness: awake Pain management: pain level controlled Vital Signs Assessment: post-procedure vital signs reviewed and stable Respiratory status: spontaneous breathing and respiratory function stable Cardiovascular status: blood pressure returned to baseline and stable Postop Assessment: no headache and no apparent nausea or vomiting Anesthetic complications: no Comments: Late entry   No notable events documented.   Last Vitals:  Vitals:   04/25/23 0908 04/25/23 1015  BP: 120/82 101/77  Pulse: 68 89  Resp: 15 13  Temp: 36.8 C (!) 36.4 C  SpO2: 100% 95%    Last Pain:  Vitals:   04/25/23 1015  TempSrc: Oral  PainSc: 0-No pain                 Windell Norfolk

## 2023-09-02 ENCOUNTER — Telehealth: Admitting: Family Medicine

## 2023-09-02 DIAGNOSIS — J069 Acute upper respiratory infection, unspecified: Secondary | ICD-10-CM

## 2023-09-02 MED ORDER — BENZONATATE 100 MG PO CAPS: 100.0000 mg | ORAL_CAPSULE | Freq: Three times a day (TID) | ORAL | 0 refills | Status: AC | PRN

## 2023-09-02 NOTE — Progress Notes (Signed)

## 2023-09-22 ENCOUNTER — Telehealth: Admitting: Family Medicine

## 2023-09-22 DIAGNOSIS — H9201 Otalgia, right ear: Secondary | ICD-10-CM

## 2023-09-22 MED ORDER — AMOXICILLIN-POT CLAVULANATE 875-125 MG PO TABS: 1.0000 | ORAL_TABLET | Freq: Two times a day (BID) | ORAL | 0 refills | Status: DC

## 2023-09-22 NOTE — Progress Notes (Signed)

## 2023-09-28 ENCOUNTER — Ambulatory Visit: Admitting: Adult Health

## 2023-10-02 ENCOUNTER — Other Ambulatory Visit (HOSPITAL_BASED_OUTPATIENT_CLINIC_OR_DEPARTMENT_OTHER): Payer: Self-pay

## 2023-10-02 MED ORDER — PREDNISONE 10 MG PO TABS
10.0000 mg | ORAL_TABLET | Freq: Every day | ORAL | 0 refills | Status: DC
  Filled 2023-10-02: qty 5, 5d supply, fill #0

## 2023-11-06 ENCOUNTER — Other Ambulatory Visit (HOSPITAL_BASED_OUTPATIENT_CLINIC_OR_DEPARTMENT_OTHER): Payer: Self-pay

## 2023-11-06 MED ORDER — TRAZODONE HCL 50 MG PO TABS
50.0000 mg | ORAL_TABLET | Freq: Every evening | ORAL | 1 refills | Status: DC | PRN
  Filled 2023-11-06: qty 90, 90d supply, fill #0

## 2023-11-06 MED ORDER — MOUNJARO 7.5 MG/0.5ML ~~LOC~~ SOAJ
7.5000 mg | SUBCUTANEOUS | 1 refills | Status: AC
  Filled 2023-11-06 – 2023-11-26 (×2): qty 2, 28d supply, fill #0
  Filled 2023-12-26 – 2024-01-17 (×2): qty 2, 28d supply, fill #1

## 2023-11-06 MED ORDER — PROMETHAZINE HCL 25 MG PO TABS
25.0000 mg | ORAL_TABLET | Freq: Three times a day (TID) | ORAL | 0 refills | Status: AC | PRN
  Filled 2023-11-06: qty 30, 10d supply, fill #0

## 2023-11-14 ENCOUNTER — Other Ambulatory Visit (HOSPITAL_BASED_OUTPATIENT_CLINIC_OR_DEPARTMENT_OTHER): Payer: Self-pay

## 2023-11-14 MED ORDER — ZOLPIDEM TARTRATE 5 MG PO TABS
5.0000 mg | ORAL_TABLET | Freq: Every evening | ORAL | 0 refills | Status: DC
  Filled 2023-11-14: qty 30, 30d supply, fill #0

## 2023-11-14 MED ORDER — PREDNISONE 20 MG PO TABS
40.0000 mg | ORAL_TABLET | Freq: Every morning | ORAL | 0 refills | Status: DC
  Filled 2023-11-14: qty 10, 5d supply, fill #0

## 2023-11-27 ENCOUNTER — Other Ambulatory Visit (HOSPITAL_BASED_OUTPATIENT_CLINIC_OR_DEPARTMENT_OTHER): Payer: Self-pay

## 2023-12-07 ENCOUNTER — Other Ambulatory Visit (HOSPITAL_COMMUNITY)
Admission: RE | Admit: 2023-12-07 | Discharge: 2023-12-07 | Disposition: A | Source: Ambulatory Visit | Attending: Adult Health | Admitting: Adult Health

## 2023-12-07 ENCOUNTER — Ambulatory Visit (INDEPENDENT_AMBULATORY_CARE_PROVIDER_SITE_OTHER): Admitting: Adult Health

## 2023-12-07 ENCOUNTER — Encounter: Payer: Self-pay | Admitting: Adult Health

## 2023-12-07 VITALS — BP 120/81 | HR 76 | Ht 64.0 in | Wt 189.0 lb

## 2023-12-07 DIAGNOSIS — R300 Dysuria: Secondary | ICD-10-CM

## 2023-12-07 DIAGNOSIS — Z01419 Encounter for gynecological examination (general) (routine) without abnormal findings: Secondary | ICD-10-CM | POA: Diagnosis present

## 2023-12-07 DIAGNOSIS — Z1151 Encounter for screening for human papillomavirus (HPV): Secondary | ICD-10-CM | POA: Diagnosis not present

## 2023-12-07 LAB — POCT URINALYSIS DIPSTICK
Blood, UA: NEGATIVE
Glucose, UA: NEGATIVE
Ketones, UA: NEGATIVE
Leukocytes, UA: NEGATIVE
Nitrite, UA: NEGATIVE
Protein, UA: NEGATIVE

## 2023-12-07 NOTE — Progress Notes (Signed)
Patient ID: Jackie Smith, female   DOB: 08-17-1985, 38 y.o.   MRN: [id number] History of Present Illness:  Jackie Smith is a 38 year old white female, divorced, [id number] in for a well woman gyn exam and pap. She has some burning with urination. She is working 2 jobs and going to school.  PCP is CHRISTELLA Buttner PA  Current Medications, Allergies, Past Medical History, Past Surgical History, Family History and Social History were reviewed in [employer] record.     Review of Systems: Patient denies any headaches, hearing loss, fatigue, blurred vision, shortness of breath, chest pain, abdominal pain, problems with bowel movements, or intercourse. No joint pain or mood swings.  See HPI for positives    Physical Exam:BP 120/81 (BP Location: Right Arm, Patient Position: Sitting, Cuff Size: Normal)   Pulse 76   Ht 5' 4 (1.626 m)   Wt 189 lb (85.7 kg)   BMI 32.44 kg/m  Urine dipstick was negative General:  Well developed, well nourished, no acute distress Skin:  Warm and dry, she has braces now Neck:  Midline trachea, normal thyroid , good ROM, no lymphadenopathy Lungs; Clear to auscultation bilaterally Breast:  No dominant palpable mass, retraction, or nipple discharge Cardiovascular: Regular rate and rhythm Abdomen:  Soft, non tender, no hepatosplenomegaly Pelvic:  External genitalia is normal in appearance, no lesions.  The vagina is normal in appearance. Urethra has no lesions or masses. The cervix is bulbous.Pap with GC/CHL and HR HPV genotyping performed.  Uterus is felt to be normal size, shape, and contour.  No adnexal masses or tenderness noted.Bladder is non tender, no masses felt. Rectal:Deferred  Extremities/musculoskeletal:  No swelling or varicosities noted, no clubbing or cyanosis Psych:  No mood changes, alert and cooperative,seems happy AA is 0 Fall risk is low    12/07/2023    1:48 PM 08/26/2022   11:21 AM 04/28/2021    1:32 PM  Depression screen PHQ  2/9  Decreased Interest 0 0 0  Down, Depressed, Hopeless 0 0 0  PHQ - 2 Score 0 0 0  Altered sleeping 0 0 0  Tired, decreased energy 0 0 0  Change in appetite 0 0 0  Feeling bad or failure about yourself  0 0 0  Trouble concentrating 0 0 0  Moving slowly or fidgety/restless 0 0 0  Suicidal thoughts 0 0 0  PHQ-9 Score 0 0  0      Data saved with a previous flowsheet row definition       12/07/2023    1:48 PM 08/26/2022   11:21 AM 04/28/2021    1:32 PM  GAD 7 : Generalized Anxiety Score  Nervous, Anxious, on Edge 0 0 0  Control/stop worrying 0 0 0  Worry too much - different things 0 0 0  Trouble relaxing 0 0 0  Restless 0 0 0  Easily annoyed or irritable 0 0 0  Afraid - awful might happen 0 0 0  Total GAD 7 Score 0 0 0    Upstream - 12/07/23 1344       Pregnancy Intention Screening   Does the patient want to become pregnant in the next year? No    Does the patient's partner want to become pregnant in the next year? No    Would the patient like to discuss contraceptive options today? No      Contraception Wrap Up   Current Method Female Sterilization    End Method Female Sterilization  Contraception Counseling Provided No           Examination chaperoned by Clarita Salt LPN  Impression and plan: 1. Encounter for gynecological examination with Papanicolaou smear of cervix (Primary) Pap sent Pap in 3 years if negative Physical in 1 year Mammogram was negative in August at [facility] Had labs with PCP Had colonoscopy 04/25/23  Take time for self  - Cytology - PAP( [place])  2. Burning with urination Urine dipstick was negative , push fluids  - POCT Urinalysis Dipstick

## 2023-12-12 ENCOUNTER — Other Ambulatory Visit (HOSPITAL_BASED_OUTPATIENT_CLINIC_OR_DEPARTMENT_OTHER): Payer: Self-pay

## 2023-12-12 ENCOUNTER — Ambulatory Visit: Payer: Self-pay | Admitting: Adult Health

## 2023-12-12 LAB — CYTOLOGY - PAP
Chlamydia: NEGATIVE
Comment: NEGATIVE
Comment: NEGATIVE
Comment: NORMAL
Diagnosis: NEGATIVE
High risk HPV: NEGATIVE
Neisseria Gonorrhea: NEGATIVE

## 2023-12-12 MED ORDER — ZOLPIDEM TARTRATE 5 MG PO TABS
5.0000 mg | ORAL_TABLET | Freq: Every evening | ORAL | 0 refills | Status: DC
  Filled 2023-12-12: qty 30, 30d supply, fill #0

## 2023-12-26 ENCOUNTER — Other Ambulatory Visit (HOSPITAL_BASED_OUTPATIENT_CLINIC_OR_DEPARTMENT_OTHER): Payer: Self-pay

## 2024-01-07 ENCOUNTER — Telehealth

## 2024-01-07 DIAGNOSIS — B9789 Other viral agents as the cause of diseases classified elsewhere: Secondary | ICD-10-CM | POA: Diagnosis not present

## 2024-01-07 DIAGNOSIS — J019 Acute sinusitis, unspecified: Secondary | ICD-10-CM

## 2024-01-08 MED ORDER — IPRATROPIUM BROMIDE 0.03 % NA SOLN: 2.0000 | Freq: Two times a day (BID) | NASAL | 0 refills | Status: AC

## 2024-01-08 NOTE — Progress Notes (Signed)
 We are sorry that you are not feeling well.  Here is how we plan to help!  Based on what you have shared with me it looks like you have sinusitis.  Sinusitis is inflammation and infection in the sinus cavities of the head.  Based on your presentation I believe you most likely have Acute Viral Sinusitis.This is an infection most likely caused by a virus. There is not specific treatment for viral sinusitis other than to help you with the symptoms until the infection runs its course.  You may use an oral decongestant such as Mucinex D or if you have glaucoma or high blood pressure use plain Mucinex. Saline nasal spray help and can safely be used as often as needed for congestion, I have prescribed: Ipratropium Bromide nasal spray 0.03% 2 sprays in eah nostril 2-3 times a day  Some authorities believe that zinc sprays or the use of Echinacea may shorten the course of your symptoms.  Sinus infections are not as easily transmitted as other respiratory infection, however we still recommend that you avoid close contact with loved ones, especially the very young and elderly.  Remember to wash your hands thoroughly throughout the day as this is the number one way to prevent the spread of infection!  Home Care: Only take medications as instructed by your medical team. Do not take these medications with alcohol. A steam or ultrasonic humidifier can help congestion.  You can place a towel over your head and breathe in the steam from hot water coming from a faucet. Avoid close contacts especially the very young and the elderly. Cover your mouth when you cough or sneeze. Always remember to wash your hands.  Get Help Right Away If: You develop worsening fever or sinus pain. You develop a severe head ache or visual changes. Your symptoms persist after you have completed your treatment plan.  Make sure you Understand these instructions. Will watch your condition. Will get help right away if you are not doing  well or get worse.  Your e-visit answers were reviewed by a board certified advanced clinical practitioner to complete your personal care plan.  Depending on the condition, your plan could have included both over the counter or prescription medications.  If there is a problem please reply  once you have received a response from your provider.  Your safety is important to us .  If you have drug allergies check your prescription carefully.    You can use MyChart to ask questions about today's visit, request a non-urgent call back, or ask for a work or school excuse for 24 hours related to this e-Visit. If it has been greater than 24 hours you will need to follow up with your provider, or enter a new e-Visit to address those concerns.  You will get an e-mail in the next two days asking about your experience.  I hope that your e-visit has been valuable and will speed your recovery. Thank you for using e-visits.  I have spent 5 minutes in review of e-visit questionnaire, review and updating patient chart, medical decision making and response to patient.   Elsie Velma Lunger, PA-C

## 2024-01-10 ENCOUNTER — Other Ambulatory Visit (HOSPITAL_BASED_OUTPATIENT_CLINIC_OR_DEPARTMENT_OTHER): Payer: Self-pay

## 2024-01-10 NOTE — Progress Notes (Signed)
 CARDIOLOGY CONSULT NOTE       Patient ID: Jackie Smith MRN: [id number] DOB/AGE: 1985/05/03 38 y.o.  Referring Physician: Norleen Montclair [facility] Primary Physician: Job Bolt, [place] Primary Cardiologist: Delford Reason for Consultation: Palpitations Pre syncope   HPI:  38 y.o. referred from [facility] ER [place] for palpitations and pre syncope. First seen by me 02/22/22.  History of migraines , DM thyroid  dx.  Seen in their ER 01/16/22 Was holding grandson and got in car felt like she was going to pass out Heart pounding ? 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 Rx Robaxin and naproxen   Has not had any issues since Works out at gym 3-4x/week. Does cardia and lifts weights. She has 3 kids. One married and has a development worker, international aid. She just moved to [place] to be closer to work at [employer] ER  ROS All other systems reviewed and negative except as noted above  Past Medical History:  Diagnosis Date   Acid reflux    Bipolar 1 disorder (HCC)    Frequent UTI    Gastritis    History of benign breast tumor    Infections of kidney    Irritable bowel syndrome (IBS)    Thyroid  disease    Type 2 diabetes mellitus (HCC) 05/02/2019   Vaginal Pap smear, abnormal      Family History  Problem Relation Age of Onset   Drug abuse Mother    Cancer Mother        cervix   Other Mother        MVA   Bipolar disorder Father    Drug abuse Father    Alcoholism Father    Depression Sister    Anxiety disorder Sister    Skin cancer Maternal Grandmother    Bipolar disorder Paternal Uncle    Heart disease Other    Arthritis Other    Cancer Other    Asthma Other    Diabetes Other    Colon cancer Neg Hx     Social History   Socioeconomic History   Marital status: Divorced    Spouse name: Not on file   Number of children: Not on file   Years of education: college   Highest education level: Not on file  Occupational History   Occupation: call center office    Employer: [employer]   Occupation: Nurse tech  Tobacco Use   Smoking status: Former    Current packs/day: 0.00    Average packs/day: 0.3 packs/day    Types: Cigarettes    Quit date: 04/26/2014    Years since quitting: 9.7   Smokeless tobacco: Never  Vaping Use  Vaping status: Never Used  Substance and Sexual Activity   Alcohol use: No   Drug use: No   Sexual activity: Yes    Birth control/protection: Surgical    Comment: tubal & ablation  Other Topics Concern   Not on file  Social History Narrative   Patient currently works full-time from home.  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  She is currently enrolled in nursing school.   Social Drivers of Health   Tobacco Use: Medium Risk (01/22/2024)   Patient History    Smoking Tobacco Use: Former    Smokeless Tobacco Use: Never    Passive Exposure: Not on file  Financial Resource Strain: Low Risk (12/07/2023)   Overall Financial Resource Strain (CARDIA)    Difficulty of Paying Living Expenses: Not hard at all  Food Insecurity: No Food Insecurity (12/07/2023)   Epic    Worried About Programme Researcher, Broadcasting/film/video in the Last Year: Never true    Ran Out of Food in the Last Year: Never true  Transportation Needs: No  Transportation Needs (12/07/2023)   Epic    Lack of Transportation (Medical): No    Lack of Transportation (Non-Medical): No  Physical Activity: Sufficiently Active (12/07/2023)   Exercise Vital Sign    Days of Exercise per Week: 6 days    Minutes of Exercise per Session: 60 min  Stress: No Stress Concern Present (12/07/2023)   [employer] of Occupational Health - Occupational Stress Questionnaire    Feeling of Stress: Not at all  Social Connections: Moderately Isolated (12/07/2023)   Social Connection and Isolation Panel    Frequency of Communication with Friends and Family: More than three times a week    Frequency of Social Gatherings with Friends and Family: Once a week    Attends Religious Services: More than 4 times per year    Active Member of Clubs or Organizations: No    Attends Banker Meetings: Never    Marital Status: Divorced  Catering Manager Violence: Not At Risk (12/07/2023)   Epic    Fear of Current or Ex-Partner: No    Emotionally Abused: No    Physically Abused: No    Sexually Abused: No  Depression (PHQ2-9): Low Risk (12/07/2023)   Depression (PHQ2-9)    PHQ-2 Score: 0  Alcohol Screen: Low Risk (12/07/2023)   Alcohol Screen    Last Alcohol Screening Score (AUDIT): 0  Housing: Low Risk (12/07/2023)   Epic    Unable to Pay for Housing in the Last Year: No    Number of Times Moved in the Last Year: 0    Homeless in the Last Year: No  Utilities: Not At Risk (12/07/2023)   Epic    Threatened with loss of utilities: No  Health Literacy: Adequate Health Literacy (12/07/2023)   B1300 Health Literacy    Frequency of need for help with medical instructions: Never    Past Surgical History:  Procedure Laterality Date   c sections     COLONOSCOPY N/A 04/25/2023   Procedure: COLONOSCOPY;  Surgeon: Cindie Carlin POUR, DO;  Location: [facility];  Service: Endoscopy;  Laterality: N/A;  9:30 am, asa 2   DILATION AND CURETTAGE OF UTERUS     INDUCED  ABORTION     LASER ABLATION OF THE CERVIX     TUBAL LIGATION        Current Outpatient Medications:    amoxicillin  (AMOXIL ) 500 MG capsule, Take 1  capsule (500 mg total) by mouth 2 (two) times daily for 10 days., Disp: 20 capsule, Rfl: 0   BIOTIN PO, Take by mouth., Disp: , Rfl:    cholecalciferol (VITAMIN D3) 25 MCG (1000 UNIT) tablet, Take 1,000 Units by mouth daily., Disp: , Rfl:    COENZYME Q-10 PO, Take by mouth., Disp: , Rfl:    MAGNESIUM GLYCINATE PO, Take by mouth., Disp: , Rfl:    MOUNJARO  7.5 MG/0.5ML Pen, Inject 7.5 mg into the skin once a week., Disp: 2 mL, Rfl: 1   Omega-3 Fatty Acids (FISH OIL PO), Take by mouth., Disp: , Rfl:    UNABLE TO FIND, Amberen-2 pills daily, Disp: , Rfl:    vitamin B-12 (CYANOCOBALAMIN) 500 MCG tablet, Take 500 mcg by mouth daily., Disp: , Rfl:    zolpidem  (AMBIEN ) 5 MG tablet, Take 1 tablet (5 mg total) by mouth every evening as needed., Disp: 30 tablet, Rfl: 0   ipratropium (ATROVENT ) 0.03 % nasal spray, Place 2 sprays into both nostrils every 12 (twelve) hours. (Patient not taking: Reported on 01/22/2024), Disp: 30 mL, Rfl: 0   ondansetron  (ZOFRAN -ODT) 4 MG disintegrating tablet, Take 4 mg by mouth. (Patient not taking: Reported on 01/22/2024), Disp: , Rfl:    promethazine  (PHENERGAN ) 25 MG tablet, Take 1 tablet (25 mg total) by mouth every 8 (eight) hours as needed. (Patient not taking: Reported on 01/22/2024), Disp: 30 tablet, Rfl: 0    Physical Exam: Blood pressure 110/70, pulse 66, height 5' 4 (1.626 m), weight 188 lb 12.8 oz (85.6 kg), SpO2 95%.   Affect appropriate Healthy:  appears stated age HEENT: normal Neck supple with no adenopathy JVP normal no bruits no thyromegaly Lungs clear with no wheezing and good diaphragmatic motion Heart:  S1/S2 2/6 SEM murmur, no rub, gallop or click PMI normal Abdomen: benighn, BS positve, no tenderness, no AAA no bruit.  No HSM or HJR Distal pulses intact with no bruits No edema Neuro  non-focal Skin warm and dry No muscular weakness   Labs:   Lab Results  Component Value Date   WBC 7.2 08/23/2022   HGB 15.1 (H) 08/23/2022   HCT 43.7 08/23/2022   MCV 91.0 08/23/2022   PLT 176 08/23/2022   No results for input(s): NA, K, CL, CO2, BUN, CREATININE, CALCIUM, PROT, BILITOT, ALKPHOS, ALT, AST, GLUCOSE in the last 168 hours.  Invalid input(s): LABALBU Lab Results  Component Value Date   TROPONINI <0.03 06/26/2018    Lab Results  Component Value Date   CHOL 216 (H) 05/01/2019   Lab Results  Component Value Date   HDL 46 (L) 05/01/2019   Lab Results  Component Value Date   LDLCALC 143 (H) 05/01/2019   Lab Results  Component Value Date   TRIG 143 05/01/2019   Lab Results  Component Value Date   CHOLHDL 4.7 05/01/2019   No results found for: LDLDIRECT    Radiology: No results found.  EKG: SR rate 80 normal low voltage from body habitus    ASSESSMENT AND PLAN:   Palpitations/Dizziness;  no obvious cardiac etiology ECG is low risk exam low risk  Prior monitor and echo normal Observe  DM:  ? No recent A1c using Mounjaro  f/u primary  Obesity :  on mounjaro  f/u primary doing much better with diet/exercise  Thyroid  :  TSH normal  Migraines:  f/U primary with dizziness will order non constrast head CT  Chest pain atypical r/o risk stratify with calcium score AT her  age high likely-hood it will be 0 She is very active with no symptoms since ER Murmur:  benign SEM no need for echo at this time   Calcium score   F/U cardiology PRN   Signed: Maude Emmer 01/22/2024, 2:40 PM

## 2024-01-17 ENCOUNTER — Other Ambulatory Visit: Payer: Self-pay

## 2024-01-17 ENCOUNTER — Telehealth: Admitting: Nurse Practitioner

## 2024-01-17 ENCOUNTER — Other Ambulatory Visit (HOSPITAL_BASED_OUTPATIENT_CLINIC_OR_DEPARTMENT_OTHER): Payer: Self-pay

## 2024-01-17 DIAGNOSIS — B9689 Other specified bacterial agents as the cause of diseases classified elsewhere: Secondary | ICD-10-CM

## 2024-01-17 DIAGNOSIS — J028 Acute pharyngitis due to other specified organisms: Secondary | ICD-10-CM | POA: Diagnosis not present

## 2024-01-17 MED ORDER — AMOXICILLIN 500 MG PO CAPS: 500.0000 mg | ORAL_CAPSULE | Freq: Two times a day (BID) | ORAL | 0 refills | Status: AC

## 2024-01-17 NOTE — Progress Notes (Signed)

## 2024-01-19 ENCOUNTER — Other Ambulatory Visit (HOSPITAL_BASED_OUTPATIENT_CLINIC_OR_DEPARTMENT_OTHER): Payer: Self-pay

## 2024-01-22 ENCOUNTER — Encounter: Payer: Self-pay | Admitting: Cardiovascular Disease

## 2024-01-22 ENCOUNTER — Other Ambulatory Visit (HOSPITAL_BASED_OUTPATIENT_CLINIC_OR_DEPARTMENT_OTHER): Payer: Self-pay

## 2024-01-22 ENCOUNTER — Ambulatory Visit: Attending: Cardiovascular Disease | Admitting: Cardiovascular Disease

## 2024-01-22 VITALS — BP 110/70 | HR 66 | Ht 64.0 in | Wt 188.8 lb

## 2024-01-22 DIAGNOSIS — R079 Chest pain, unspecified: Secondary | ICD-10-CM

## 2024-01-22 DIAGNOSIS — R002 Palpitations: Secondary | ICD-10-CM | POA: Diagnosis not present

## 2024-01-22 DIAGNOSIS — Z136 Encounter for screening for cardiovascular disorders: Secondary | ICD-10-CM

## 2024-01-22 DIAGNOSIS — E663 Overweight: Secondary | ICD-10-CM | POA: Diagnosis not present

## 2024-01-22 NOTE — Patient Instructions (Addendum)
 Medication Instructions:   Your physician recommends that you continue on your current medications as directed. Please refer to the Current Medication list given to you today.   Labwork:  None today  Testing/Procedures: Schedule $99 coronary calcium score  Follow-Up: As needed with Dr.Nishan  Any Other Special Instructions Will Be Listed Below (If Applicable).  If you need a refill on your cardiac medications before your next appointment, please call your pharmacy.

## 2024-01-24 ENCOUNTER — Ambulatory Visit: Payer: Self-pay

## 2024-01-24 ENCOUNTER — Other Ambulatory Visit (HOSPITAL_BASED_OUTPATIENT_CLINIC_OR_DEPARTMENT_OTHER): Payer: Self-pay

## 2024-01-24 MED ORDER — MOUNJARO 5 MG/0.5ML ~~LOC~~ SOAJ
5.0000 mg | SUBCUTANEOUS | 0 refills | Status: AC
  Filled 2024-01-24 – 2024-02-21 (×3): qty 2, 28d supply, fill #0

## 2024-01-24 MED ORDER — ZOLPIDEM TARTRATE 5 MG PO TABS
5.0000 mg | ORAL_TABLET | Freq: Every evening | ORAL | 2 refills | Status: AC | PRN
  Filled 2024-01-24: qty 30, 30d supply, fill #0
  Filled 2024-02-21: qty 30, 30d supply, fill #1

## 2024-01-31 ENCOUNTER — Ambulatory Visit: Admission: RE | Admit: 2024-01-31 | Source: Ambulatory Visit

## 2024-02-09 ENCOUNTER — Other Ambulatory Visit (HOSPITAL_BASED_OUTPATIENT_CLINIC_OR_DEPARTMENT_OTHER): Payer: Self-pay

## 2024-02-20 ENCOUNTER — Other Ambulatory Visit (HOSPITAL_BASED_OUTPATIENT_CLINIC_OR_DEPARTMENT_OTHER): Payer: Self-pay

## 2024-02-21 ENCOUNTER — Other Ambulatory Visit (HOSPITAL_BASED_OUTPATIENT_CLINIC_OR_DEPARTMENT_OTHER): Payer: Self-pay
